# Patient Record
Sex: Female | Born: 1960 | Race: White | Hispanic: No | Marital: Married | Smoking: Never smoker
Health system: Southern US, Community
[De-identification: ages and names within clinical notes are randomized; demographics above are authoritative.]

## PROBLEM LIST (undated history)

## (undated) DIAGNOSIS — K219 Gastro-esophageal reflux disease without esophagitis: Secondary | ICD-10-CM

## (undated) DIAGNOSIS — N979 Female infertility, unspecified: Secondary | ICD-10-CM

## (undated) DIAGNOSIS — M1990 Unspecified osteoarthritis, unspecified site: Secondary | ICD-10-CM

## (undated) DIAGNOSIS — T7840XA Allergy, unspecified, initial encounter: Secondary | ICD-10-CM

## (undated) DIAGNOSIS — J309 Allergic rhinitis, unspecified: Secondary | ICD-10-CM

## (undated) DIAGNOSIS — F329 Major depressive disorder, single episode, unspecified: Secondary | ICD-10-CM

## (undated) DIAGNOSIS — K589 Irritable bowel syndrome without diarrhea: Secondary | ICD-10-CM

## (undated) DIAGNOSIS — I341 Nonrheumatic mitral (valve) prolapse: Secondary | ICD-10-CM

## (undated) DIAGNOSIS — C801 Malignant (primary) neoplasm, unspecified: Secondary | ICD-10-CM

## (undated) DIAGNOSIS — R9389 Abnormal findings on diagnostic imaging of other specified body structures: Secondary | ICD-10-CM

## (undated) DIAGNOSIS — F419 Anxiety disorder, unspecified: Secondary | ICD-10-CM

## (undated) DIAGNOSIS — G4730 Sleep apnea, unspecified: Secondary | ICD-10-CM

## (undated) DIAGNOSIS — F32A Depression, unspecified: Secondary | ICD-10-CM

## (undated) DIAGNOSIS — J45909 Unspecified asthma, uncomplicated: Secondary | ICD-10-CM

## (undated) DIAGNOSIS — Z86010 Personal history of colonic polyps: Secondary | ICD-10-CM

## (undated) DIAGNOSIS — U071 COVID-19: Secondary | ICD-10-CM

## (undated) DIAGNOSIS — E785 Hyperlipidemia, unspecified: Secondary | ICD-10-CM

## (undated) DIAGNOSIS — G709 Myoneural disorder, unspecified: Secondary | ICD-10-CM

## (undated) DIAGNOSIS — G43909 Migraine, unspecified, not intractable, without status migrainosus: Secondary | ICD-10-CM

## (undated) DIAGNOSIS — M8580 Other specified disorders of bone density and structure, unspecified site: Secondary | ICD-10-CM

## (undated) DIAGNOSIS — H269 Unspecified cataract: Secondary | ICD-10-CM

## (undated) HISTORY — DX: Major depressive disorder, single episode, unspecified: F32.9

## (undated) HISTORY — DX: Hyperlipidemia, unspecified: E78.5

## (undated) HISTORY — DX: Personal history of colonic polyps: Z86.010

## (undated) HISTORY — DX: Abnormal findings on diagnostic imaging of other specified body structures: R93.89

## (undated) HISTORY — DX: Unspecified cataract: H26.9

## (undated) HISTORY — DX: COVID-19: U07.1

## (undated) HISTORY — PX: EYE SURGERY: SHX253

## (undated) HISTORY — PX: HYSTEROPLASTY REPAIR OF UTERINE ANOMALY: SUR663

## (undated) HISTORY — PX: APPENDECTOMY: SHX54

## (undated) HISTORY — DX: Depression, unspecified: F32.A

## (undated) HISTORY — DX: Migraine, unspecified, not intractable, without status migrainosus: G43.909

## (undated) HISTORY — DX: Other specified disorders of bone density and structure, unspecified site: M85.80

## (undated) HISTORY — DX: Unspecified osteoarthritis, unspecified site: M19.90

## (undated) HISTORY — DX: Myoneural disorder, unspecified: G70.9

## (undated) HISTORY — DX: Allergy, unspecified, initial encounter: T78.40XA

## (undated) HISTORY — DX: Sleep apnea, unspecified: G47.30

## (undated) HISTORY — DX: Allergic rhinitis, unspecified: J30.9

## (undated) HISTORY — PX: LAPAROSCOPIC ENDOMETRIOSIS FULGURATION: SUR769

## (undated) HISTORY — DX: Malignant (primary) neoplasm, unspecified: C80.1

## (undated) HISTORY — DX: Nonrheumatic mitral (valve) prolapse: I34.1

## (undated) HISTORY — DX: Female infertility, unspecified: N97.9

## (undated) HISTORY — DX: Unspecified asthma, uncomplicated: J45.909

## (undated) HISTORY — DX: Anxiety disorder, unspecified: F41.9

## (undated) HISTORY — DX: Gastro-esophageal reflux disease without esophagitis: K21.9

## (undated) HISTORY — DX: Irritable bowel syndrome, unspecified: K58.9

## (undated) HISTORY — PX: MANDIBLE RECONSTRUCTION: SHX431

---

## 2004-07-01 HISTORY — PX: CARDIAC CATHETERIZATION: SHX172

## 2006-07-01 DIAGNOSIS — Z860100 Personal history of colon polyps, unspecified: Secondary | ICD-10-CM

## 2006-07-01 HISTORY — DX: Personal history of colonic polyps: Z86.010

## 2006-07-01 HISTORY — DX: Personal history of colon polyps, unspecified: Z86.0100

## 2006-08-20 LAB — HM COLONOSCOPY

## 2009-07-01 HISTORY — PX: TRIGGER FINGER RELEASE: SHX641

## 2009-07-01 HISTORY — PX: CARPAL TUNNEL RELEASE: SHX101

## 2010-09-19 ENCOUNTER — Other Ambulatory Visit (INDEPENDENT_AMBULATORY_CARE_PROVIDER_SITE_OTHER): Payer: No Typology Code available for payment source

## 2010-09-19 ENCOUNTER — Other Ambulatory Visit: Payer: Self-pay | Admitting: Internal Medicine

## 2010-09-19 ENCOUNTER — Ambulatory Visit (INDEPENDENT_AMBULATORY_CARE_PROVIDER_SITE_OTHER): Payer: No Typology Code available for payment source | Admitting: Internal Medicine

## 2010-09-19 ENCOUNTER — Encounter: Payer: Self-pay | Admitting: Internal Medicine

## 2010-09-19 VITALS — BP 110/74 | HR 69 | Temp 97.9°F | Ht 68.0 in | Wt 141.0 lb

## 2010-09-19 DIAGNOSIS — K589 Irritable bowel syndrome without diarrhea: Secondary | ICD-10-CM

## 2010-09-19 DIAGNOSIS — Z0000 Encounter for general adult medical examination without abnormal findings: Secondary | ICD-10-CM

## 2010-09-19 DIAGNOSIS — D126 Benign neoplasm of colon, unspecified: Secondary | ICD-10-CM

## 2010-09-19 DIAGNOSIS — F32A Depression, unspecified: Secondary | ICD-10-CM

## 2010-09-19 DIAGNOSIS — E785 Hyperlipidemia, unspecified: Secondary | ICD-10-CM

## 2010-09-19 DIAGNOSIS — R41840 Attention and concentration deficit: Secondary | ICD-10-CM

## 2010-09-19 DIAGNOSIS — B351 Tinea unguium: Secondary | ICD-10-CM

## 2010-09-19 DIAGNOSIS — F329 Major depressive disorder, single episode, unspecified: Secondary | ICD-10-CM

## 2010-09-19 DIAGNOSIS — K635 Polyp of colon: Secondary | ICD-10-CM | POA: Insufficient documentation

## 2010-09-19 DIAGNOSIS — F3289 Other specified depressive episodes: Secondary | ICD-10-CM

## 2010-09-19 DIAGNOSIS — R9389 Abnormal findings on diagnostic imaging of other specified body structures: Secondary | ICD-10-CM | POA: Insufficient documentation

## 2010-09-19 DIAGNOSIS — G43909 Migraine, unspecified, not intractable, without status migrainosus: Secondary | ICD-10-CM | POA: Insufficient documentation

## 2010-09-19 LAB — COMPREHENSIVE METABOLIC PANEL
Albumin: 4.5 g/dL (ref 3.5–5.2)
CO2: 31 mEq/L (ref 19–32)
Calcium: 9.6 mg/dL (ref 8.4–10.5)
Chloride: 100 mEq/L (ref 96–112)
GFR: 73.47 mL/min (ref >60.00)
Glucose, Bld: 90 mg/dL (ref 70–99)
Potassium: 4.1 mEq/L (ref 3.5–5.1)
Sodium: 138 mEq/L (ref 135–145)
Total Protein: 7.5 g/dL (ref 6.0–8.3)

## 2010-09-19 LAB — CBC WITH DIFFERENTIAL/PLATELET
Basophils Absolute: 0 10*3/uL (ref 0.0–0.1)
Basophils Relative: 0.3 % (ref 0.0–3.0)
Hemoglobin: 13.4 g/dL (ref 12.0–15.0)
Lymphocytes Relative: 22 % (ref 12.0–46.0)
Monocytes Relative: 7.4 % (ref 3.0–12.0)
Neutro Abs: 3.2 10*3/uL (ref 1.4–7.7)
RBC: 4.32 Mil/uL (ref 3.87–5.11)
RDW: 13.1 % (ref 11.5–14.6)

## 2010-09-19 MED ORDER — SERTRALINE HCL 100 MG PO TABS: 100.0000 mg | ORAL_TABLET | Freq: Every day | ORAL | Status: DC

## 2010-09-19 NOTE — Progress Notes (Signed)
Subjective:    Patient ID: Renee [name], female    DOB: April 22, 1961, 50 y.o.   MRN: [id number]  HPI Renee [name] presents to establish for on-going continuity care having recently moved to [place] from [place].  She reports a 2 month history of left neck pain with no loss of range of motion, no radicular symptoms. She reports that massage does help.  She reports that several toe-nails have become thickend but not to the point of pain or discomfort.  For the last two years she has been taking zoloft for depression with anxiety. For many years she has had a problem with concentration and distraction. This was discussed with her psychiatrist at the time she started zoloft and the psychiatrist was hopeful that the zoloft would reduce the distractability but it hasn't. At this point she is interested in additional treatment  For several months she has had increasing hot flashes. Her last menstrual period was December '10.  Past Medical History  Diagnosis Date  . Allergy   . Migraines   . Hx of colonic polyps   . MVP (mitral valve prolapse)   . IBS (irritable bowel syndrome)   . Arthritis     both knees, post traumatic  . Depression     with anxiety. Medically treated for last 2 years  . Migraine     resolved after teenage years  . Colon polyps   . IBS (irritable bowel syndrome)   . Infertility, female     failed 3 attempts at in vitro fertilization  . MVP (mitral valve prolapse)     last 2D echo approx '10  . Abnormal chest x-ray     CXR c/w COPD. Full PFTs '11 - normal   Past Surgical History  Procedure Date  . Appendectomy   . Mandible reconstruction   . Laparoscopic endometriosis fulguration     1996 and 2002  . Hysteroplasty repair of uterine anomaly     2003 - laproscopically; 2006 - laparotomy  . Cardiac catheterization     2006 - normal study  . Carpal tunnel release     2011 right hand   Family History  Problem Relation Age of Onset  . Arthritis Mother    DOB 70  . Cancer Mother     Thyroid cancer  . Breast cancer Mother   . Hypertension Mother   . Atrial fibrillation Mother   . Melanoma Mother   . Arthritis Father     DOB 1929  . Melanoma Father   Negative for colon cancer, diabetes, Coronary artery disease/MI  History   Social History  . Marital Status: Married    Spouse Name: N/A    Number of Children: 0  . Years of Education: 16   Occupational History  . 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 Smoking status: Never Smoker   . Smokeless tobacco: Never Used  . Alcohol Use: No  . Drug Use: No  . Sexually Active: Yes   Other Topics Concern  . Not on file   Social History Narrative   Graduated from [employer] with degree in Food science/nutrition.Married '94Denies any physical or sexual abuseFamily in [place]: parents and sisterSO EtOH problems - in recovery 6 yearsHas smoke alarms, wears seatbelt at all times, uses [place] when appropriate, firearms present in the home, uses herbal remedies, caffeine- 2-3 per day.No regular exercise       Review of Systems  Constitutional:  Negative.        [Night sweats HENT: Positive for neck pain and neck stiffness. Negative for hearing loss, congestion, facial swelling and tinnitus.   Eyes: Negative.   Respiratory: Positive for shortness of breath.        [Occasional air hunger - "sigh of anxiety" Cardiovascular: Positive for palpitations. Negative for chest pain and leg swelling.  Gastrointestinal: Positive for abdominal distention.       [Bloating, heartburn Genitourinary: Negative.  Negative for dyspareunia.  Musculoskeletal:       [Right hand stiffness, mostly in the AM Neurological: Negative.   Psychiatric/Behavioral: Positive for decreased concentration.       [Anxiety persists      Objective:   Physical Exam  [nursing notereviewed. Constitutional: She is oriented to person, place, and time. She appears  well-developed and well-nourished.  HENT:  Head: Normocephalic and atraumatic.  Right Ear: External ear normal.  Left Ear: External ear normal.  Mouth/Throat: Oropharynx is clear and moist.  Eyes: Conjunctivae and EOM are normal. Pupils are equal, round, and reactive to light.  Neck: Normal range of motion. Neck supple. No thyromegaly present.  Cardiovascular: Normal rate and regular rhythm.  Exam reveals no friction rub.   Murmur heard.      Mitral valve click/murmur at apex  Pulmonary/Chest: Effort normal and breath sounds normal. She has no rales.  Abdominal: Soft. Bowel sounds are normal. She exhibits no distension. There is no tenderness.  Musculoskeletal: Normal range of motion.  Neurological: She is alert and oriented to person, place, and time. She has normal reflexes.  Skin: Skin is warm and dry.       Very mild nail thickening 2nd, 3rd toes right foot.  Psychiatric: She has a normal mood and affect. Her behavior is normal. Thought content normal.          Assessment & Plan:  1. Distraction and lack of concentration which may be ADD  Plan - refer to [facility] for ADD assessment  2. Left Neck pain - limited exam is normal. No restriction in activity  Plan - orthopedic neck pillow (memory foam)            Daily range of motion - i.e. Neck rolls, shoulder shrugs. May go to YouTube - search neck pain and exercise - for video demonstrations  3. Paronychia - mild.   Plan - no medical treatment indicated. Pt referred to [email or web address]  4. Symptoms of climacteric - not too severe. Discussed hormone replacement and risks, treatment being a question of life-style issues.  Plan - referred to [email or web address] for more information.  5. Mitral Valve Prolapse - she has had 2 D echo in the last 2 years and being asymptomatic would need follow-up 2 D echo at 3-5 years.   6. Health Maintenance - patient is current with gyn and will be due for Pelvic/PAP in May. She  will decide between receiving care here or with gyn. She will be establishing with Dr. Nicholas Lose for dermatology. She is due for colonoscopy and will be referred to GI for the same. She is a candidate for tetnus booster. She has had pulmonary function testing and does not need any furhter evaluation. She is for routine labs today. She will return in 4-6 weeks or in May.  Addendum - all labs are normal with LDL less than 130. Lab Results  Component Value Date   WBC 4.8 09/19/2010   HGB 13.4 09/19/2010   HCT 38.9 09/19/2010  PLT 188.0 09/19/2010   CHOL 208* 09/19/2010   TRIG 42.0 09/19/2010   HDL 82.80 09/19/2010   LDLDIRECT 105.3 09/19/2010   ALT 17 09/19/2010   AST 23 09/19/2010   NA 138 09/19/2010   K 4.1 09/19/2010   CL 100 09/19/2010   CREATININE 0.9 09/19/2010   BUN 19 09/19/2010   CO2 31 09/19/2010   TSH 1.76 09/19/2010

## 2010-09-20 ENCOUNTER — Encounter (INDEPENDENT_AMBULATORY_CARE_PROVIDER_SITE_OTHER): Payer: Self-pay | Admitting: *Deleted

## 2010-09-20 LAB — GLIA (IGA/G) + TTG IGA: Tissue Transglutaminase Ab, IgA: 3.2 U/mL (ref <20)

## 2010-09-23 ENCOUNTER — Encounter: Payer: Self-pay | Admitting: Internal Medicine

## 2010-09-25 ENCOUNTER — Encounter: Payer: Self-pay | Admitting: Internal Medicine

## 2010-09-27 NOTE — Letter (Signed)
Summary: Pre Visit Letter Revised  [facility]  [address] [place], [place] [address]   Phone: [phone]  Fax: [phone]        09/20/2010 MRN: [id number] Renee [name] [address] [place], [place]  [address]  [place]             Procedure Date:  11-12-10           Direct Colon---Dr. Leone Payor   Welcome to the Gastroenterology Division at [facility].    You are scheduled to see a nurse for your pre-procedure visit on 10-29-10 at 2:30p.m. on the 3rd floor at [facility], [address]  We ask that you try to arrive at our office 15 minutes prior to your appointment time to allow for check-in.  Please take a minute to review the attached form.  If you answer "Yes" to one or more of the questions on the first page, we ask that you call the person listed at your earliest opportunity.  If you answer "No" to all of the questions, please complete the rest of the form and bring it to your appointment.    Your nurse visit will consist of discussing your medical and surgical history, your immediate family medical history, and your medications.   If you are unable to list all of your medications on the form, please bring the medication bottles to your appointment and we will list them.  We will need to be aware of both prescribed and over the counter drugs.  We will need to know exact dosage information as well.    Please be prepared to read and sign documents such as consent forms, a financial agreement, and acknowledgement forms.  If necessary, and with your consent, a friend or relative is welcome to sit-in on the nurse visit with you.  Please bring your insurance card so that we may make a copy of it.  If your insurance requires a referral to see a specialist, please bring your referral form from your primary care physician.  No co-pay is required for this nurse visit.     If you cannot keep your appointment, please call [phone] to cancel or reschedule  prior to your appointment date.  This allows [place] the opportunity to schedule an appointment for another patient in need of care.    Thank you for choosing [facility] for your medical needs.  We appreciate the opportunity to care for you.  Please visit [place] at our website  to learn more about our practice.  Sincerely, The Gastroenterology Division

## 2010-10-01 ENCOUNTER — Ambulatory Visit (INDEPENDENT_AMBULATORY_CARE_PROVIDER_SITE_OTHER): Payer: 59 | Admitting: Licensed Clinical Social Worker

## 2010-10-01 DIAGNOSIS — F4322 Adjustment disorder with anxiety: Secondary | ICD-10-CM

## 2010-10-02 ENCOUNTER — Other Ambulatory Visit (INDEPENDENT_AMBULATORY_CARE_PROVIDER_SITE_OTHER): Payer: 59

## 2010-10-02 DIAGNOSIS — F4322 Adjustment disorder with anxiety: Secondary | ICD-10-CM

## 2010-10-04 ENCOUNTER — Ambulatory Visit (INDEPENDENT_AMBULATORY_CARE_PROVIDER_SITE_OTHER): Payer: 59 | Admitting: Licensed Clinical Social Worker

## 2010-10-04 DIAGNOSIS — F4323 Adjustment disorder with mixed anxiety and depressed mood: Secondary | ICD-10-CM

## 2010-11-12 ENCOUNTER — Other Ambulatory Visit: Payer: No Typology Code available for payment source | Admitting: Internal Medicine

## 2011-08-08 ENCOUNTER — Telehealth: Payer: Self-pay | Admitting: Internal Medicine

## 2011-08-08 NOTE — Telephone Encounter (Signed)
Ok for refill on zoloft x 5

## 2011-08-08 NOTE — Telephone Encounter (Signed)
The pt called and stated she needed a refill of Zoloft 100mg .  Thanks!

## 2011-08-09 MED ORDER — SERTRALINE HCL 100 MG PO TABS: 100.0000 mg | ORAL_TABLET | Freq: Every day | ORAL | Status: DC

## 2011-08-09 NOTE — Telephone Encounter (Signed)
Done

## 2011-09-18 ENCOUNTER — Other Ambulatory Visit: Payer: Self-pay | Admitting: Internal Medicine

## 2011-11-11 ENCOUNTER — Ambulatory Visit (INDEPENDENT_AMBULATORY_CARE_PROVIDER_SITE_OTHER): Payer: 59 | Admitting: Internal Medicine

## 2011-11-11 ENCOUNTER — Encounter: Payer: Self-pay | Admitting: Internal Medicine

## 2011-11-11 ENCOUNTER — Ambulatory Visit (INDEPENDENT_AMBULATORY_CARE_PROVIDER_SITE_OTHER)
Admission: RE | Admit: 2011-11-11 | Discharge: 2011-11-11 | Disposition: A | Discharge status: Home / Self Care | Payer: 59 | Source: Ambulatory Visit | Attending: Internal Medicine | Admitting: Internal Medicine

## 2011-11-11 VITALS — BP 120/72 | HR 69 | Temp 98.4°F | Ht 68.0 in | Wt 147.1 lb

## 2011-11-11 DIAGNOSIS — N951 Menopausal and female climacteric states: Secondary | ICD-10-CM

## 2011-11-11 DIAGNOSIS — M542 Cervicalgia: Secondary | ICD-10-CM

## 2011-11-11 DIAGNOSIS — F329 Major depressive disorder, single episode, unspecified: Secondary | ICD-10-CM

## 2011-11-11 DIAGNOSIS — F3289 Other specified depressive episodes: Secondary | ICD-10-CM

## 2011-11-11 DIAGNOSIS — F32A Depression, unspecified: Secondary | ICD-10-CM

## 2011-11-11 DIAGNOSIS — R232 Flushing: Secondary | ICD-10-CM

## 2011-11-11 DIAGNOSIS — D126 Benign neoplasm of colon, unspecified: Secondary | ICD-10-CM

## 2011-11-11 DIAGNOSIS — J329 Chronic sinusitis, unspecified: Secondary | ICD-10-CM

## 2011-11-11 DIAGNOSIS — IMO0002 Reserved for concepts with insufficient information to code with codable children: Secondary | ICD-10-CM

## 2011-11-11 DIAGNOSIS — K635 Polyp of colon: Secondary | ICD-10-CM

## 2011-11-11 DIAGNOSIS — M6530 Trigger finger, unspecified finger: Secondary | ICD-10-CM

## 2011-11-11 MED ORDER — NAPROXEN SODIUM 220 MG PO TABS: 220.0000 mg | ORAL_TABLET | Freq: Two times a day (BID) | ORAL | Status: DC

## 2011-11-11 MED ORDER — CYCLOBENZAPRINE HCL 5 MG PO TABS: 5.0000 mg | ORAL_TABLET | Freq: Three times a day (TID) | ORAL | Status: DC | PRN

## 2011-11-11 MED ORDER — SERTRALINE HCL 25 MG PO TABS: 25.0000 mg | ORAL_TABLET | Freq: Every day | ORAL | Status: DC

## 2011-11-11 MED ORDER — FLUTICASONE PROPIONATE 50 MCG/ACT NA SUSP: 2.0000 | Freq: Every day | NASAL | Status: DC

## 2011-11-11 NOTE — Assessment & Plan Note (Signed)
Hx same - last colo 2008 - pt will fax copy of report (last done in [place]) Refer to GI now for follow up

## 2011-11-11 NOTE — Assessment & Plan Note (Signed)
Nocturnal congestion when supine Chronic Afrin use Continue Claritin, start flonase and stop Afrin - Refer to ENT at pt request to check for septal issues or other sinus problems

## 2011-11-11 NOTE — Assessment & Plan Note (Signed)
Hx surgical release on R hand - thumb and ring finger 2011 Failed conservative tx with steroid injections prior to surgery Now increasing symptoms in L thumb and recurrent in R ring finger Refer to hand for eval and tx of same

## 2011-11-11 NOTE — Assessment & Plan Note (Signed)
On SSRI since 2010 - started for irritability with perimenopausal symptoms (hot flashes) Would like to slowly wean off same -  Drop from sertraline 150>125  Qd for 4 weeks, then 100mg  qd x 4-6 wks;, goal 75mg  qd Works with [employer] health [employer]) prn

## 2011-11-11 NOTE — Progress Notes (Signed)
Subjective:    Patient ID: Renee [name], female    DOB: [birth year], 51 y.o.   MRN: [id number]  HPI New to me, known to my practice (transfer from [facility]) - here to est care with female provider Multiple concerns  Depression - on SSRI since 2010 with good symptoms relief - has seen psyc intermittently during the past years, no regular scheduled appt at this time. ?ok to wean off sertraline  Trigger fingers - R hand requiring surgical release in 2011 after failure to respond to steroids - now recurrent symptoms R 4th finger and new in L thumb - requests hand eval  Also complains of sinusitis - chronic allergies, but nasal congestion worse at night Using Afrin chronically - ?ENT eval  Also R neck tightness - ?neck arthritis - no injury - no numbness or radiation into RUE - not using any OTC meds or PT - no weakness, no headache   Past Medical History  Diagnosis Date  . Allergic rhinitis, cause unspecified   . Migraines     during teenage years  . Hx of colonic polyps 2008  . IBS (irritable bowel syndrome)   . Arthritis     both knees, post traumatic - neck  . Depression     with anxiety.  . Infertility, female     failed 3 attempts at in vitro fertilization  . MVP (mitral valve prolapse)     last 2D echo approx '10  . Abnormal chest x-ray     CXR c/w COPD. Full PFTs '11 - normal   Family History  Problem Relation Age of Onset  . Arthritis Mother     DOB 71  . Cancer Mother     Thyroid cancer  . Breast cancer Mother   . Hypertension Mother   . Atrial fibrillation Mother   . Melanoma Mother   . Arthritis Father     DOB 1929  . Melanoma Father    History  Substance Use Topics  . Smoking status: Never Smoker   . Smokeless tobacco: Never Used  . Alcohol Use: No    Review of Systems Constitutional: Negative for fever or weight change.  Respiratory: Negative for cough and shortness of breath.   Cardiovascular: Negative for chest pain or palpitations.    Gastrointestinal: Negative for abdominal pain, no bowel changes.  Musculoskeletal: Negative for gait problem or joint swelling.  Skin: Negative for rash.  Neurological: Negative for dizziness or headache.  No other specific complaints in a complete review of systems (except as listed in HPI above).     Objective:   Physical Exam BP 120/72  Pulse 69  Temp(Src) 98.4 F (36.9 C) (Oral)  Ht 5\' 8"  (1.727 m)  Wt 147 lb 1.9 oz (66.733 kg)  BMI 22.37 kg/m2  SpO2 98% Wt Readings from Last 3 Encounters:  11/11/11 147 lb 1.9 oz (66.733 kg)  09/19/10 141 lb (63.957 kg)   Constitutional: She appears well-developed and well-nourished. No distress.  HENT: Head: Normocephalic and atraumatic. Ears: B TMs ok, no erythema or effusion; Nose: Nose normal. No septal deviation or evidence for polyps. Mouth/Throat: Oropharynx is clear and moist. No oropharyngeal exudate.  Eyes: Conjunctivae and EOM are normal. Pupils are equal, round, and reactive to light. No scleral icterus.  Neck: Normal range of motion. Neck supple. No JVD present. No thyromegaly present.  Cardiovascular: Normal rate, regular rhythm and normal heart sounds.  No murmur heard. No BLE edema. Pulmonary/Chest: Effort normal and breath sounds normal. No  respiratory distress. She has no wheezes.  Abdominal: Soft. Bowel sounds are normal. She exhibits no distension. There is no tenderness. no masses Musculoskeletal: Myofacial spasm R trap/upper neck. R shoulder with normal range of motion. no joint effusions. No gross deformities. Trigger finger on L thumb and R ring finger - no locking but incomplete flexion R 4th finger with hand grip. nontender Neurological: She is alert and oriented to person, place, and time. No cranial nerve deficit. Coordination, balance and gait normal.  Skin: Skin is warm and dry. No rash noted. No erythema.  Psychiatric: She has a normal mood and affect. Her behavior is normal. Judgment and thought content normal.    Lab Results  Component Value Date   WBC 4.8 09/19/2010   HGB 13.4 09/19/2010   HCT 38.9 09/19/2010   PLT 188.0 09/19/2010   GLUCOSE 90 09/19/2010   CHOL 208* 09/19/2010   TRIG 42.0 09/19/2010   HDL 82.80 09/19/2010   LDLDIRECT 105.3 09/19/2010   ALT 17 09/19/2010   AST 23 09/19/2010   NA 138 09/19/2010   K 4.1 09/19/2010   CL 100 09/19/2010   CREATININE 0.9 09/19/2010   BUN 19 09/19/2010   CO2 31 09/19/2010   TSH 1.76 09/19/2010       Assessment & Plan:  See problem list. Medications and labs reviewed today.  cervicalgia - muscle spasm R neck intermittent for >28mo - check DG cpsine to look for DDD - on exam neurovasc intact RUE - add flexeril qhs x 1 week and recommended Aleve qd x 1 week - then both prn

## 2011-11-11 NOTE — Patient Instructions (Addendum)
It was good to see you today. We have reviewed your prior records including labs and tests today Reduce sertraline to 125 mg daily Start Aleve and flexeril at bedtime for 1 week, then use both as needed Your prescription(s) have been submitted to your pharmacy. Please take as directed and contact our office if you believe you are having problem(s) with the medication(s). Xray neck ordered today. Your results will be called to you after review (48-72hours after test completion). If any changes need to be made, you will be notified at that time. we'll make referral to gynecology, gastroenterology, hand and ENT . Our office will contact you regarding appointment(s) once made. Please schedule followup in 6-12 weeks for physical and labs, call sooner if problems.

## 2011-11-18 ENCOUNTER — Telehealth: Payer: Self-pay | Admitting: *Deleted

## 2011-11-18 DIAGNOSIS — Z0000 Encounter for general adult medical examination without abnormal findings: Secondary | ICD-10-CM

## 2011-11-18 NOTE — Telephone Encounter (Signed)
Pt coming in to have cpx need labs entered in epic,,, 11/18/11@4 :05pm/LMB

## 2012-01-28 ENCOUNTER — Encounter: Payer: Self-pay | Admitting: Gastroenterology

## 2012-02-04 HISTORY — PX: TRIGGER FINGER RELEASE: SHX641

## 2012-02-10 ENCOUNTER — Other Ambulatory Visit (INDEPENDENT_AMBULATORY_CARE_PROVIDER_SITE_OTHER): Payer: 59

## 2012-02-10 DIAGNOSIS — Z0000 Encounter for general adult medical examination without abnormal findings: Secondary | ICD-10-CM

## 2012-02-10 LAB — URINALYSIS, ROUTINE W REFLEX MICROSCOPIC
Hgb urine dipstick: NEGATIVE
Leukocytes, UA: NEGATIVE
Nitrite: NEGATIVE
Specific Gravity, Urine: 1.02 (ref 1.000–1.030)
Urine Glucose: NEGATIVE
Urobilinogen, UA: 0.2 (ref 0.0–1.0)

## 2012-02-10 LAB — LDL CHOLESTEROL, DIRECT: Direct LDL: 120.1 mg/dL

## 2012-02-10 LAB — HEPATIC FUNCTION PANEL
Bilirubin, Direct: 0.1 mg/dL (ref 0.0–0.3)
Total Bilirubin: 1 mg/dL (ref 0.3–1.2)
Total Protein: 7.7 g/dL (ref 6.0–8.3)

## 2012-02-10 LAB — BASIC METABOLIC PANEL
GFR: 72.1 mL/min (ref >60.00)
Glucose, Bld: 86 mg/dL (ref 70–99)
Potassium: 4.6 mEq/L (ref 3.5–5.1)
Sodium: 139 mEq/L (ref 135–145)

## 2012-02-10 LAB — LIPID PANEL
HDL: 76.7 mg/dL (ref >39.00)
Total CHOL/HDL Ratio: 3

## 2012-02-12 ENCOUNTER — Ambulatory Visit (INDEPENDENT_AMBULATORY_CARE_PROVIDER_SITE_OTHER): Payer: 59 | Admitting: Internal Medicine

## 2012-02-12 ENCOUNTER — Encounter: Payer: Self-pay | Admitting: Internal Medicine

## 2012-02-12 VITALS — BP 112/68 | HR 61 | Temp 97.7°F | Ht 68.0 in | Wt 145.0 lb

## 2012-02-12 DIAGNOSIS — Z0000 Encounter for general adult medical examination without abnormal findings: Secondary | ICD-10-CM

## 2012-02-12 DIAGNOSIS — F32A Depression, unspecified: Secondary | ICD-10-CM

## 2012-02-12 DIAGNOSIS — R5381 Other malaise: Secondary | ICD-10-CM

## 2012-02-12 DIAGNOSIS — F3289 Other specified depressive episodes: Secondary | ICD-10-CM

## 2012-02-12 DIAGNOSIS — R002 Palpitations: Secondary | ICD-10-CM

## 2012-02-12 DIAGNOSIS — R5383 Other fatigue: Secondary | ICD-10-CM

## 2012-02-12 DIAGNOSIS — F329 Major depressive disorder, single episode, unspecified: Secondary | ICD-10-CM

## 2012-02-12 DIAGNOSIS — N959 Unspecified menopausal and perimenopausal disorder: Secondary | ICD-10-CM | POA: Insufficient documentation

## 2012-02-12 DIAGNOSIS — Z23 Encounter for immunization: Secondary | ICD-10-CM

## 2012-02-12 MED ORDER — SERTRALINE HCL 25 MG PO TABS: 25.0000 mg | ORAL_TABLET | Freq: Every day | ORAL | Status: DC

## 2012-02-12 NOTE — Patient Instructions (Signed)
It was good to see you today. We have reviewed your prior records including labs and tests today Health Maintenance reviewed - tetanus updated today. All other recommended immunizations and age-appropriate screenings are up-to-date. we'll make referral for DEXA (bone density) . Our office will contact you regarding appointment(s) once made. Medications reviewed, no changes at this time. Work wit dr Jearld Fenton as ongoing for your sinus/allergy and throat as discussed Please schedule followup in 6 months, call sooner if problems.

## 2012-02-12 NOTE — Progress Notes (Signed)
Subjective:    Patient ID: Renee [name], female    DOB: April 25, 1961, 51 y.o.   MRN: [id number]  HPI  patient is here today for annual physical. Patient feels well and has no complaints.  Also reviewed chronic medical issues:  Depression - on SSRI since 2010 with good symptoms relief - has seen psyc intermittently during the past years, no regular scheduled appt at this time. Working to wean off sertraline  Trigger fingers - R hand requiring surgical release in 2011 after failure to respond to steroids - then recurrent symptoms R 4th finger (injection 12/2011) and L thumb (surgery 01/2012) -    Past Medical History  Diagnosis Date  . Allergic rhinitis, cause unspecified   . Migraines     during teenage years  . Hx of colonic polyps 2008  . IBS (irritable bowel syndrome)   . Arthritis     both knees, post traumatic - neck  . Depression     with anxiety.  . Infertility, female     failed 3 attempts at in vitro fertilization  . MVP (mitral valve prolapse)     last 2D echo approx '10  . Abnormal chest x-ray     CXR c/w COPD. Full PFTs '11 - normal   Family History  Problem Relation Age of Onset  . Arthritis Mother     DOB 69  . Cancer Mother     Thyroid cancer  . Breast cancer Mother   . Hypertension Mother   . Atrial fibrillation Mother   . Melanoma Mother   . Arthritis Father     DOB 1929  . Melanoma Father    History  Substance Use Topics  . Smoking status: Never Smoker   . Smokeless tobacco: Never Used  . Alcohol Use: No    Review of Systems  Constitutional: Negative for fever or weight change. positive for fatigue Respiratory: Negative for cough and shortness of breath.   Cardiovascular: Negative for chest pain or palpitations.  Gastrointestinal: Negative for abdominal pain, no bowel changes.  Musculoskeletal: Negative for gait problem or joint swelling.  Skin: Negative for rash.  Neurological: Negative for dizziness or headache.  No other specific  complaints in a complete review of systems (except as listed in HPI above).     Objective:   Physical Exam  BP 112/68  Pulse 61  Temp 97.7 F (36.5 C) (Oral)  Ht 5\' 8"  (1.727 m)  Wt 145 lb (65.772 kg)  BMI 22.05 kg/m2  SpO2 98% Wt Readings from Last 3 Encounters:  02/12/12 145 lb (65.772 kg)  11/11/11 147 lb 1.9 oz (66.733 kg)  09/19/10 141 lb (63.957 kg)   Constitutional: She appears well-developed and well-nourished. No distress.  HENT: Head: Normocephalic and atraumatic. Ears: B TMs ok, no erythema or effusion; Nose: Nose normal. No septal deviation or evidence for polyps. Mouth/Throat: Oropharynx is clear and moist. No oropharyngeal exudate. tiny flesh colored polyp L OP Eyes: Conjunctivae and EOM are normal. Pupils are equal, round, and reactive to light. No scleral icterus.  Neck: Normal range of motion. Neck supple. No JVD present. No thyromegaly or goiter present.  Cardiovascular: Normal rate, regular rhythm and normal heart sounds.  No murmur heard. No BLE edema. Pulmonary/Chest: Effort normal and breath sounds normal. No respiratory distress. She has no wheezes.  Abdominal: Soft. Bowel sounds are normal. She exhibits no distension. There is no tenderness. no masses Musculoskeletal: no gross deformity - s/p L thumb trigger surgery  Neurological: She  is alert and oriented to person, place, and time. No cranial nerve deficit. Coordination, balance and gait normal.  Skin: Skin is warm and dry. No rash noted. No erythema.  Psychiatric: She has a normal mood and affect. Her behavior is normal. Judgment and thought content normal.   Lab Results  Component Value Date   WBC 4.8 09/19/2010   HGB 13.4 09/19/2010   HCT 38.9 09/19/2010   PLT 188.0 09/19/2010   GLUCOSE 86 02/10/2012   CHOL 214* 02/10/2012   TRIG 104.0 02/10/2012   HDL 76.70 02/10/2012   LDLDIRECT 120.1 02/10/2012   ALT 15 02/10/2012   AST 19 02/10/2012   NA 139 02/10/2012   K 4.6 02/10/2012   CL 101 02/10/2012    CREATININE 0.9 02/10/2012   BUN 17 02/10/2012   CO2 31 02/10/2012   TSH 2.82 02/10/2012   ECG: sinus @ 61 bpm - RBBB and R axis    Assessment & Plan:  CPX/v70.0 - Patient has been counseled on age-appropriate routine health concerns for screening and prevention. These are reviewed and up-to-date. Immunizations are up-to-date or declined. Labs and ECG reviewed.  Fatigue - nonspecific hx/exam - FH of thyroid issues reviewed but normal TSH and exam - ?depression - see next - will monitor  Also see problem list. Medications and labs reviewed today.

## 2012-02-12 NOTE — Assessment & Plan Note (Signed)
On SSRI since 2010 - started for irritability with perimenopausal symptoms (hot flashes) Would like to slowly wean off same -  Dropped from sertraline 150>125 in 10/2011 - stable at this time Also works with [employer] health [employer]) prn

## 2012-02-18 ENCOUNTER — Ambulatory Visit (INDEPENDENT_AMBULATORY_CARE_PROVIDER_SITE_OTHER)
Admission: RE | Admit: 2012-02-18 | Discharge: 2012-02-18 | Disposition: A | Discharge status: Home / Self Care | Payer: No Typology Code available for payment source | Source: Ambulatory Visit

## 2012-02-18 DIAGNOSIS — N959 Unspecified menopausal and perimenopausal disorder: Secondary | ICD-10-CM

## 2012-02-28 ENCOUNTER — Encounter: Payer: Self-pay | Admitting: Gastroenterology

## 2012-02-28 ENCOUNTER — Ambulatory Visit (INDEPENDENT_AMBULATORY_CARE_PROVIDER_SITE_OTHER): Payer: No Typology Code available for payment source | Admitting: Gastroenterology

## 2012-02-28 VITALS — BP 118/64 | HR 74 | Ht 68.0 in | Wt 143.0 lb

## 2012-02-28 DIAGNOSIS — R195 Other fecal abnormalities: Secondary | ICD-10-CM

## 2012-02-28 DIAGNOSIS — R198 Other specified symptoms and signs involving the digestive system and abdomen: Secondary | ICD-10-CM

## 2012-02-28 DIAGNOSIS — R194 Change in bowel habit: Secondary | ICD-10-CM

## 2012-02-28 MED ORDER — METHYLCELLULOSE (LAXATIVE) PO POWD: 1.0000 | Freq: Every day | ORAL | Status: DC

## 2012-02-28 MED ORDER — ALIGN PO CAPS: 1.0000 | ORAL_CAPSULE | Freq: Every day | ORAL | Status: DC

## 2012-02-28 MED ORDER — PEG-KCL-NACL-NASULF-NA ASC-C 100 G PO SOLR: 1.0000 | Freq: Once | ORAL | Status: DC

## 2012-02-28 NOTE — Progress Notes (Signed)
HPI: This is a    very pleasant 51 year old woman whom I am meeting for the first time today.  Sent home with fobt cards by Dr. Vincente Poli , at least one was positive.   For 20 , issues with her bowels, told she had IBS.  Mainly constipated in the past.  In past couple weeks intermittent diarrhea.  Normally she will go 1-2 times a day, now 2-3 times a day and can be pretty loose.   She had trigger finger surgery 3 weeks ago, was on abx afterwards for a few days (5 day course) .  No narcotic pains meds for that.  No real pains in abd.  She has had colonoscopies in past, first at age 21 in [place].  Was having bloating, led to colonoscopy found two polyps that were removed.  Review of systems: Pertinent positive and negative review of systems were noted in the above HPI section. Complete review of systems was performed and was otherwise normal.    Past Medical History  Diagnosis Date  . Allergic rhinitis, cause unspecified   . Migraines     during teenage years  . Hx of colonic polyps 2008  . IBS (irritable bowel syndrome)   . Arthritis     both knees, post traumatic - neck  . Depression     with anxiety.  . Infertility, female     failed 3 attempts at in vitro fertilization  . MVP (mitral valve prolapse)     last 2D echo approx '10  . Abnormal chest x-ray     CXR c/w COPD. Full PFTs '11 - normal  . Depression     Past Surgical History  Procedure Date  . Appendectomy   . Mandible reconstruction   . Laparoscopic endometriosis fulguration     1996 and 2002  . Hysteroplasty repair of uterine anomaly     2003 - laproscopically; 2006 - laparotomy  . Cardiac catheterization 2006    normal study  . Carpal tunnel release 2011    right hand  . Trigger finger release 2011    R thimb and ring finger  . Trigger finger release 02/04/2012    Gramig, in office    Current Outpatient Prescriptions  Medication Sig Dispense Refill  . cyclobenzaprine (FLEXERIL) 5 MG tablet Take 1 tablet (5  mg total) by mouth every 8 (eight) hours as needed for muscle spasms.  30 tablet  1  . DIGESTIVE ENZYMES PO Take by mouth. Takes but not on a regular basis      . fluticasone (FLONASE) 50 MCG/ACT nasal spray Place 2 sprays into the nose daily.  16 g  6  . loratadine (CLARITIN) 10 MG tablet Take 10 mg by mouth daily.      . naproxen sodium (ALEVE) 220 MG tablet Take 1 tablet (220 mg total) by mouth 2 (two) times daily with a meal.      . Probiotic Product (PROBIOTIC PO) Take by mouth. Takes but not on a regular basis      . sertraline (ZOLOFT) 100 MG tablet Take 1 tablet (100 mg total) by mouth daily. With 25mg  tab daily  135 tablet  3  . sertraline (ZOLOFT) 25 MG tablet Take 1 tablet (25 mg total) by mouth daily. Take with 100 mg daily  90 tablet  3    Allergies as of 02/28/2012 - Review Complete 02/28/2012  Allergen Reaction Noted  . Codeine  09/19/2010  . Pollen extract  11/11/2011  Family History  Problem Relation Age of Onset  . Arthritis Mother     DOB 35  . Cancer Mother     Thyroid cancer  . Breast cancer Mother   . Hypertension Mother   . Atrial fibrillation Mother   . Melanoma Mother   . Arthritis Father     DOB 1929  . Melanoma Father   . Breast cancer Mother   . Colon cancer      Maternal Great Uncle     History   Social History  . Marital Status: Married    Spouse Name: N/A    Number of Children: 0  . Years of Education: 16   Occupational History  . 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 Smoking status: Never Smoker   . Smokeless tobacco: Never Used  . Alcohol Use: No  . Drug Use: No  . Sexually Active: Yes   Other Topics Concern  . 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       Physical Exam: BP 118/64  Pulse 74  Ht 5\' 8"  (1.727 m)  Wt 143 lb (64.864 kg)  BMI 21.74 kg/m2 Constitutional: generally well-appearing Psychiatric: alert and oriented x3 Eyes: extraocular movements intact Mouth: oral pharynx moist, no lesions Neck: supple no lymphadenopathy Cardiovascular: heart regular rate and rhythm Lungs: clear to auscultation bilaterally Abdomen: soft, nontender, nondistended, no obvious ascites, no peritoneal signs, normal bowel sounds Extremities: no lower extremity edema bilaterally Skin: no lesions on visible extremities    Assessment and plan: 51 y.o. female with  Hemoccult-positive stool, personal history of polyps (unclear pathology), recent change in bowel habits  First I suspect her recent change in her bowel habits is likely related to the antibiotic which was started for her trigger finger surgery 3 weeks ago. I suggested fiber supplements and probiotics to try to help get her bowels back to more normal. She does have Hemoccult-positive stool and her last colonoscopy was about 5 years ago, 2008. We will get records from her previous colonoscopy examinations however I think with her change in bowels, her likely personal history of precancerous polyps, her Hemoccult-positive stool she needs a repeat colonoscopy around now and we will set that up for her.

## 2012-02-28 NOTE — Patient Instructions (Addendum)
We have sent the following medications to your pharmacy for you to pick up at your convenience: Moviprep You have been scheduled for a colonoscopy Please follow written instructions given to you at your visit today.  Please pick up your prep kit at the pharmacy within the next 1-3 days. If you use inhalers (even only as needed), please bring them with you on the day of your procedure.     We will get records from Dr. Gerda Diss in [place] (colonoscopy reports and any associated pathology reports). You will be set up for a colonoscopy for hemocult + stool, recent change in bowels, personal history of polyps. Please start taking citrucel (orange flavored) powder fiber supplement.  This may cause some bloating at first but that usually goes away. Begin with a small spoonful and work your way up to a large, heaping spoonful daily over a week. Try probiotics for  few weeks.

## 2012-03-11 ENCOUNTER — Ambulatory Visit (AMBULATORY_SURGERY_CENTER): Payer: No Typology Code available for payment source | Admitting: Gastroenterology

## 2012-03-11 ENCOUNTER — Encounter: Payer: Self-pay | Admitting: Gastroenterology

## 2012-03-11 VITALS — BP 122/61 | HR 74 | Temp 96.5°F | Resp 24 | Ht 68.0 in | Wt 143.0 lb

## 2012-03-11 DIAGNOSIS — R198 Other specified symptoms and signs involving the digestive system and abdomen: Secondary | ICD-10-CM

## 2012-03-11 DIAGNOSIS — R195 Other fecal abnormalities: Secondary | ICD-10-CM

## 2012-03-11 MED ORDER — SODIUM CHLORIDE 0.9 % IV SOLN: 500.0000 mL | INTRAVENOUS | Status: DC

## 2012-03-11 NOTE — Op Note (Signed)
[facility] [address] [place], [address]   COLONOSCOPY PROCEDURE REPORT  PATIENT: Renee [name], Renee [name]  MR#: [id number] BIRTHDATE: 06-28-1961 , 50  yrs. old GENDER: Female ENDOSCOPIST: Rachael Fee, MD REFERRED [id number] Felicity Coyer, M.D. PROCEDURE DATE:  03/11/2012 PROCEDURE:   Colonoscopy, diagnostic ASA CLASS:   Class II INDICATIONS: FOB positive stool, last colonoscopy 5 years ago. MEDICATIONS: Fentanyl 75 mcg IV, Versed 8 mg IV, and These medications were titrated to patient response per physician's verbal order  DESCRIPTION OF PROCEDURE:   After the risks benefits and alternatives of the procedure were thoroughly explained, informed consent was obtained.  A digital rectal exam revealed no rectal mass.   The LB CF-[id number]  endoscope was introduced through the anus and advanced to the cecum, which was identified by both the appendix and ileocecal valve. No adverse events experienced. The quality of the prep was good.  The instrument was then slowly withdrawn as the colon was fully examined.    COLON FINDINGS: A normal appearing cecum, ileocecal valve, and appendiceal orifice were identified.  The ascending, hepatic flexure, transverse, splenic flexure, descending, sigmoid colon and rectum appeared unremarkable.  No polyps or cancers were seen. Retroflexed views revealed no abnormalities. The time to cecum=4 minutes 11 seconds.  Withdrawal time=12 minutes 51 seconds.  The scope was withdrawn and the procedure completed. COMPLICATIONS: There were no complications.  ENDOSCOPIC IMPRESSION: Normal colon, no poltyps or cancers   RECOMMENDATIONS: Dr.  Christella Hartigan' office will try again to get your previous colonoscopy records [place], [place] about 5 years ago) to more accurately advise on the timing of your next colonoscopy.  It will be at least 5 years, possibly 10 depending on the exact pathology of previous polyp that was removed.    eSigned:   Rachael Fee, MD 03/11/2012 9:27 AM

## 2012-03-11 NOTE — Progress Notes (Signed)
Patient did not experience any of the following events: a burn prior to discharge; a fall within the facility; wrong site/side/patient/procedure/implant event; or a hospital transfer or hospital admission upon discharge from the facility. (G8907) Patient did not have preoperative order for IV antibiotic SSI prophylaxis. (G8918)  

## 2012-03-11 NOTE — Progress Notes (Signed)
The pt tolerated the colonoscopy very well. maw 

## 2012-03-11 NOTE — Patient Instructions (Addendum)

## 2012-03-12 ENCOUNTER — Encounter: Payer: Self-pay | Admitting: Internal Medicine

## 2012-03-12 ENCOUNTER — Telehealth: Payer: Self-pay | Admitting: *Deleted

## 2012-03-12 NOTE — Telephone Encounter (Signed)
Left message on number given in admitting yesterday as directed. ewm

## 2012-04-06 ENCOUNTER — Encounter: Payer: Self-pay | Admitting: Internal Medicine

## 2012-04-06 DIAGNOSIS — M8580 Other specified disorders of bone density and structure, unspecified site: Secondary | ICD-10-CM | POA: Insufficient documentation

## 2012-04-21 ENCOUNTER — Telehealth: Payer: Self-pay

## 2012-04-21 NOTE — Telephone Encounter (Signed)
Patient called [facility] requesting lab results from recent bone density scan. Thanks

## 2012-04-21 NOTE — Telephone Encounter (Signed)
Pt informed of results of DEXA scan and of MD's advisement.

## 2012-04-21 NOTE — Telephone Encounter (Signed)
  See below - ok to relay info/results to pt as here - thanks   Notes Recorded by Carin Primrose, CMA on 04/09/2012 at 11:08 AM Left message for pt to callback office.  ------  Notes Recorded by Newt Lukes, MD on 04/06/2012 at 9:01 PM Mild bone loss on DEXA: -1.0 = osteopenia Please instruct pt on same: no med tx change recommended but continue Ca 1200mg /d + Vit D 1000U/d with weight bearing exercise daily like walking for 30 min - thanks

## 2012-06-20 ENCOUNTER — Other Ambulatory Visit: Payer: Self-pay | Admitting: Internal Medicine

## 2012-07-20 ENCOUNTER — Other Ambulatory Visit: Payer: Self-pay | Admitting: Internal Medicine

## 2012-08-12 ENCOUNTER — Encounter: Payer: Self-pay | Admitting: Internal Medicine

## 2012-08-12 ENCOUNTER — Other Ambulatory Visit (INDEPENDENT_AMBULATORY_CARE_PROVIDER_SITE_OTHER): Payer: Federal, State, Local not specified - PPO

## 2012-08-12 ENCOUNTER — Ambulatory Visit (INDEPENDENT_AMBULATORY_CARE_PROVIDER_SITE_OTHER): Payer: Federal, State, Local not specified - PPO | Admitting: Internal Medicine

## 2012-08-12 ENCOUNTER — Ambulatory Visit: Payer: 59 | Admitting: Internal Medicine

## 2012-08-12 VITALS — BP 112/72 | HR 75 | Temp 98.9°F | Ht 68.0 in | Wt 149.0 lb

## 2012-08-12 DIAGNOSIS — F329 Major depressive disorder, single episode, unspecified: Secondary | ICD-10-CM

## 2012-08-12 DIAGNOSIS — R002 Palpitations: Secondary | ICD-10-CM

## 2012-08-12 DIAGNOSIS — J329 Chronic sinusitis, unspecified: Secondary | ICD-10-CM

## 2012-08-12 DIAGNOSIS — F32A Depression, unspecified: Secondary | ICD-10-CM

## 2012-08-12 DIAGNOSIS — F3289 Other specified depressive episodes: Secondary | ICD-10-CM

## 2012-08-12 DIAGNOSIS — K589 Irritable bowel syndrome without diarrhea: Secondary | ICD-10-CM

## 2012-08-12 LAB — TSH: TSH: 1.33 u[IU]/mL (ref 0.35–5.50)

## 2012-08-12 LAB — CBC
HCT: 38.3 % (ref 36.0–46.0)
Hemoglobin: 13 g/dL (ref 12.0–15.0)
MCHC: 34 g/dL (ref 30.0–36.0)
RDW: 13.3 % (ref 11.5–14.6)

## 2012-08-12 MED ORDER — MOMETASONE FUROATE 50 MCG/ACT NA SUSP: 2.0000 | Freq: Every day | NASAL | Status: DC

## 2012-08-12 MED ORDER — CETIRIZINE HCL 10 MG PO TABS: 10.0000 mg | ORAL_TABLET | Freq: Every day | ORAL | Status: DC

## 2012-08-12 NOTE — Progress Notes (Signed)
  Subjective:    Patient ID: Renee [name], female    DOB: 23-Jul-1960, 52 y.o.   MRN: [id number]  HPI  patient is here today follow up - reviewed chronic medical issues:  IBS - GI eval for same 01/2012 with colo 03/2012: normal - continued alternating bowels - usually constipated, now diarrhea  Depression - on SSRI since 2010 with good symptoms relief - has seen psyc intermittently during the past years. Working to wean off sertraline last dose change 10/2011 - Denies adverse side effects.   Trigger fingers - R hand requiring surgical release in 2011 after failure to respond to steroids - then recurrent symptoms R 4th finger (injection 12/2011) and L thumb (surgery 01/2012) -    Past Medical History  Diagnosis Date  . Allergic rhinitis, cause unspecified   . Migraines     during teenage years  . Hx of colonic polyps 2008  . IBS (irritable bowel syndrome)   . Arthritis     both knees, post traumatic - neck  . Depression     with anxiety.  . Infertility, female     failed 3 attempts at in vitro fertilization  . MVP (mitral valve prolapse)     last 2D echo approx '10  . Abnormal chest x-ray     CXR c/w COPD. Full PFTs '11 - normal  . Depression   . Osteopenia 04/06/2012    DEXA 03/2012: -1.0    Review of Systems  Constitutional: Negative for fever or weight change. positive for fatigue Respiratory: Negative for cough and shortness of breath.   Cardiovascular: Negative for chest pain -occassional palpitations, none at this time.      Objective:   Physical Exam  BP 112/72  Pulse 75  Temp(Src) 98.9 F (37.2 C) (Oral)  Ht 5\' 8"  (1.727 m)  Wt 149 lb (67.586 kg)  BMI 22.66 kg/m2  SpO2 96% Wt Readings from Last 3 Encounters:  08/12/12 149 lb (67.586 kg)  03/11/12 143 lb (64.864 kg)  02/28/12 143 lb (64.864 kg)   Constitutional: She appears well-developed and well-nourished. No distress.  Neck: Normal range of motion. Neck supple. No JVD present. No thyromegaly or goiter  present.  Cardiovascular: Normal rate, regular rhythm and normal heart sounds.  No murmur heard. No BLE edema. Pulmonary/Chest: Effort normal and breath sounds normal. No respiratory distress. She has no wheezes.  Abdominal: Soft. Bowel sounds are normal. She exhibits no distension. There is no tenderness. no masses Psychiatric: She has a normal mood and affect. Her behavior is normal. Judgment and thought content normal.   Lab Results  Component Value Date   WBC 4.8 09/19/2010   HGB 13.4 09/19/2010   HCT 38.9 09/19/2010   PLT 188.0 09/19/2010   GLUCOSE 86 02/10/2012   CHOL 214* 02/10/2012   TRIG 104.0 02/10/2012   HDL 76.70 02/10/2012   LDLDIRECT 120.1 02/10/2012   ALT 15 02/10/2012   AST 19 02/10/2012   NA 139 02/10/2012   K 4.6 02/10/2012   CL 101 02/10/2012   CREATININE 0.9 02/10/2012   BUN 17 02/10/2012   CO2 31 02/10/2012   TSH 2.82 02/10/2012       Assessment & Plan:   palpitations - long hx same - prior ECG 01/2012 unremarkable - check TSH and CBC now - consider low dose beta-blocker but denies symptoms to warrant same tx

## 2012-08-12 NOTE — Assessment & Plan Note (Signed)
Nocturnal congestion when supine Prior chronic Afrin use, has weaned off same Continue antihistamine, change claritin to zyrtec, continue nasal steroid -change flonase to nasonex and remain off Afrin - s/p ENT eval - deviated septum - considering surgery for same

## 2012-08-12 NOTE — Assessment & Plan Note (Signed)
On SSRI since 2010 - started for irritability with perimenopausal symptoms (hot flashes) Would like to slowly wean off same -  Dropped from sertraline 150>125 in 10/2011 - stable at this time, no additional change recommended for now Also works with [employer] health Darl Pikes [place]) prn

## 2012-08-12 NOTE — Patient Instructions (Signed)
It was good to see you today. Test(s) ordered today. Your results will be released to MyChart (or called to you) after review, usually within 72hours after test completion. If any changes need to be made, you will be notified at that same time. Use Nasonex spray in place of generic Flonase and start Zyrtec daily - Your prescription(s) have been submitted to your pharmacy. Please take as directed and contact our office if you believe you are having problem(s) with the medication(s). follow up with Dr Jearld Fenton as needed Resume probiotic daily for next 30 days and let us know if GI symptoms still unimproved, sooner if worse Please schedule followup in 6-9 months for medical physical and labs, call sooner if problems.

## 2012-08-12 NOTE — Assessment & Plan Note (Signed)
S/p colo 03/2012 for same - Currently in diarrhea phase - usually constipated No red flags on hx/exam today - no fever, blood, weight loss, abdominal pain  Ok to resume probiotic trial x 30 days, call if worse or unimproved

## 2012-09-16 ENCOUNTER — Other Ambulatory Visit: Payer: Self-pay | Admitting: Internal Medicine

## 2012-11-29 HISTORY — PX: NASAL SEPTUM SURGERY: SHX37

## 2013-01-07 ENCOUNTER — Other Ambulatory Visit: Payer: Self-pay | Admitting: Otolaryngology

## 2013-01-07 DIAGNOSIS — E079 Disorder of thyroid, unspecified: Secondary | ICD-10-CM

## 2013-01-11 ENCOUNTER — Ambulatory Visit
Admission: RE | Admit: 2013-01-11 | Discharge: 2013-01-11 | Disposition: A | Discharge status: Home / Self Care | Payer: Federal, State, Local not specified - PPO | Source: Ambulatory Visit | Attending: Otolaryngology | Admitting: Otolaryngology

## 2013-01-11 DIAGNOSIS — E079 Disorder of thyroid, unspecified: Secondary | ICD-10-CM

## 2013-02-09 ENCOUNTER — Encounter: Payer: Self-pay | Admitting: Internal Medicine

## 2013-02-09 ENCOUNTER — Ambulatory Visit (INDEPENDENT_AMBULATORY_CARE_PROVIDER_SITE_OTHER): Payer: Federal, State, Local not specified - PPO | Admitting: Internal Medicine

## 2013-02-09 VITALS — BP 110/78 | HR 72 | Temp 98.2°F | Wt 142.1 lb

## 2013-02-09 DIAGNOSIS — M21612 Bunion of left foot: Secondary | ICD-10-CM

## 2013-02-09 DIAGNOSIS — Z0000 Encounter for general adult medical examination without abnormal findings: Secondary | ICD-10-CM

## 2013-02-09 DIAGNOSIS — K589 Irritable bowel syndrome without diarrhea: Secondary | ICD-10-CM

## 2013-02-09 DIAGNOSIS — M21619 Bunion of unspecified foot: Secondary | ICD-10-CM

## 2013-02-09 DIAGNOSIS — F3289 Other specified depressive episodes: Secondary | ICD-10-CM

## 2013-02-09 DIAGNOSIS — F32A Depression, unspecified: Secondary | ICD-10-CM

## 2013-02-09 DIAGNOSIS — F329 Major depressive disorder, single episode, unspecified: Secondary | ICD-10-CM

## 2013-02-09 NOTE — Assessment & Plan Note (Signed)
On SSRI since 2010 - started for irritability with perimenopausal symptoms (hot flashes) Would like to slowly wean off same -  Dropped from sertraline 150 qd to 125 qd in 10/2011 - stable at this time, no additional change recommended for now Consider decrease to 100mg  qd 08/2013 if symptoms remain stable Also works with [employer] health Renee [name]) prn

## 2013-02-09 NOTE — Assessment & Plan Note (Signed)
S/p colo 03/2012 for same - In diarrhea phase for last 78mo, previously "usually" constipated No red flags on hx/exam today - no fever, blood, weight loss, abdominal pain  ?related to change SSRI dose summer 2013 Ok to resume probiotic trial x 30 days, call if worse or unimproved

## 2013-02-09 NOTE — Progress Notes (Signed)
Subjective:    Patient ID: Renee [name], female    DOB: 1960-07-17, 52 y.o.   MRN: [id number]  HPI patient is here today for annual physical. Patient feels well overall  Also reviewed chronic medical issues:  IBS - GI eval for same 01/2012 with colo 03/2012: normal - continued alternating bowels - usually constipated, now diarrhea  Depression - on SSRI since 2010 with good symptoms relief - has seen psyc intermittently during the past years. Working to wean off sertraline last dose change 10/2011 - Denies adverse side effects.   Trigger fingers - R hand requiring surgical release in 2011 after failure to respond to steroids - then recurrent symptoms R 4th finger (injection 12/2011) and L thumb (surgery 01/2012) -    Past Medical History  Diagnosis Date  . Allergic rhinitis, cause unspecified   . Migraines     during teenage years  . Hx of colonic polyps 2008  . IBS (irritable bowel syndrome)   . Arthritis     both knees, post traumatic - neck  . Depression     with anxiety.  . Infertility, female     failed 3 attempts at in vitro fertilization  . MVP (mitral valve prolapse)     last 2D echo approx '10  . Abnormal chest x-ray     CXR c/w COPD. Full PFTs '11 - normal  . Depression   . Osteopenia 04/06/2012    DEXA 03/2012: -1.0   Family History  Problem Relation Age of Onset  . Arthritis Mother     DOB 58  . Cancer Mother     Thyroid cancer  . Hypertension Mother   . Atrial fibrillation Mother   . Melanoma Mother   . Breast cancer Mother   . Arthritis Father     DOB 1929  . Melanoma Father   . Colon polyps Neg Hx   . Rectal cancer Neg Hx   . Stomach cancer Neg Hx    History  Substance Use Topics  . Smoking status: Never Smoker   . Smokeless tobacco: Never Used  . Alcohol Use: No    Review of Systems Constitutional: Negative for fever or weight change.  Respiratory: Negative for cough and shortness of breath.   Cardiovascular: Negative for chest pain or  palpitations.  Gastrointestinal: Negative for abdominal pain, no bowel changes.  Musculoskeletal: Negative for gait problem or joint swelling.  Skin: Negative for rash.  Neurological: Negative for dizziness or headache.  No other specific complaints in a complete review of systems (except as listed in HPI above).      Objective:   Physical Exam  BP 110/78  Pulse 72  Temp(Src) 98.2 F (36.8 C) (Oral)  Wt 142 lb 1.9 oz (64.465 kg)  BMI 21.61 kg/m2  SpO2 97% Wt Readings from Last 3 Encounters:  02/09/13 142 lb 1.9 oz (64.465 kg)  08/12/12 149 lb (67.586 kg)  03/11/12 143 lb (64.864 kg)   Constitutional: She appears well-developed and well-nourished. No distress.  HENT: Head: Normocephalic and atraumatic. Ears: B TMs ok, no erythema or effusion; Nose: Nose normal. Mouth/Throat: Oropharynx is clear and moist. No oropharyngeal exudate.  Eyes: Conjunctivae and EOM are normal. Pupils are equal, round, and reactive to light. No scleral icterus.  Neck: Normal range of motion. Neck supple. No JVD present. No thyromegaly present.  Cardiovascular: Normal rate, regular rhythm and normal heart sounds.  No murmur heard. No BLE edema. Pulmonary/Chest: Effort normal and breath sounds normal. No respiratory  distress. She has no wheezes.  Abdominal: Soft. Bowel sounds are normal. She exhibits no distension. There is no tenderness. no masses Musculoskeletal: B bunion foot deformities. Normal range of motion, no joint effusions. No gross deformities Neurological: She is alert and oriented to person, place, and time. No cranial nerve deficit. Coordination, balance, strength, speech and gait are normal.  Skin: Skin is warm and dry. No rash noted. No erythema.  Psychiatric: She has a normal mood and affect. Her behavior is normal. Judgment and thought content normal.    Lab Results  Component Value Date   WBC 4.2* 08/12/2012   HGB 13.0 08/12/2012   HCT 38.3 08/12/2012   PLT 184.0 08/12/2012   GLUCOSE  86 02/10/2012   CHOL 214* 02/10/2012   TRIG 104.0 02/10/2012   HDL 76.70 02/10/2012   LDLDIRECT 120.1 02/10/2012   ALT 15 02/10/2012   AST 19 02/10/2012   NA 139 02/10/2012   K 4.6 02/10/2012   CL 101 02/10/2012   CREATININE 0.9 02/10/2012   BUN 17 02/10/2012   CO2 31 02/10/2012   TSH 1.33 08/12/2012       Assessment & Plan:   CPX/v70.0 - Patient has been counseled on age-appropriate routine health concerns for screening and prevention. These are reviewed and up-to-date. Immunizations are up-to-date or declined. Labs ordered and reviewed.  See problem list. Medications and labs reviewed today.  L foot bunion, increasingly symptomatic with pain - refer to foot specialist for further eval and tx as needed

## 2013-02-09 NOTE — Patient Instructions (Signed)
It was good to see you today. We have reviewed your prior records including labs and tests today Medications reviewed and updated, no changes recommended at this time. Consider decrease in sertraline 08/2013 if everything else ok Health Maintenance reviewed - all recommended immunizations and age-appropriate screenings are up-to-date. Test(s) ordered today. Your results will be released to MyChart (or called to you) after review, usually within 72hours after test completion. If any changes need to be made, you will be notified at that same time. we'll make referral to foot specialist for your bunion . Our office will contact you regarding appointment(s) once made. Please schedule followup in 6 months, call sooner if problems. Health Maintenance, Females A healthy lifestyle and preventative care can promote health and wellness.  Maintain regular health, dental, and eye exams.  Eat a healthy diet. Foods like vegetables, fruits, whole grains, low-fat dairy products, and lean protein foods contain the nutrients you need without too many calories. Decrease your intake of foods high in solid fats, added sugars, and salt. Get information about a proper diet from your caregiver, if necessary.  Regular physical exercise is one of the most important things you can do for your health. Most adults should get at least 150 minutes of moderate-intensity exercise (any activity that increases your heart rate and causes you to sweat) each week. In addition, most adults need muscle-strengthening exercises on 2 or more days a week.   Maintain a healthy weight. The body mass index (BMI) is a screening tool to identify possible weight problems. It provides an estimate of body fat based on height and weight. Your caregiver can help determine your BMI, and can help you achieve or maintain a healthy weight. For adults 20 years and older:  A BMI below 18.5 is considered underweight.  A BMI of 18.5 to 24.9 is normal.  A  BMI of 25 to 29.9 is considered overweight.  A BMI of 30 and above is considered obese.  Maintain normal blood lipids and cholesterol by exercising and minimizing your intake of saturated fat. Eat a balanced diet with plenty of fruits and vegetables. Blood tests for lipids and cholesterol should begin at age 77 and be repeated every 5 years. If your lipid or cholesterol levels are high, you are over 50, or you are a high risk for heart disease, you may need your cholesterol levels checked more frequently.Ongoing high lipid and cholesterol levels should be treated with medicines if diet and exercise are not effective.  If you smoke, find out from your caregiver how to quit. If you do not use tobacco, do not start.  If you are pregnant, do not drink alcohol. If you are breastfeeding, be very cautious about drinking alcohol. If you are not pregnant and choose to drink alcohol, do not exceed 1 drink per day. One drink is considered to be 12 ounces (355 mL) of beer, 5 ounces (148 mL) of wine, or 1.5 ounces (44 mL) of liquor.  Avoid use of street drugs. Do not share needles with anyone. Ask for help if you need support or instructions about stopping the use of drugs.  High blood pressure causes heart disease and increases the risk of stroke. Blood pressure should be checked at least every 1 to 2 years. Ongoing high blood pressure should be treated with medicines, if weight loss and exercise are not effective.  If you are 66 to 52 years old, ask your caregiver if you should take aspirin to prevent strokes.  Diabetes  screening involves taking a blood sample to check your fasting blood sugar level. This should be done once every 3 years, after age 52, if you are within normal weight and without risk factors for diabetes. Testing should be considered at a younger age or be carried out more frequently if you are overweight and have at least 1 risk factor for diabetes.  Breast cancer screening is essential  preventative care for women. You should practice "breast self-awareness." This means understanding the normal appearance and feel of your breasts and may include breast self-examination. Any changes detected, no matter how small, should be reported to a caregiver. Women in their 63s and 30s should have a clinical breast exam (CBE) by a caregiver as part of a regular health exam every 1 to 3 years. After age 70, women should have a CBE every year. Starting at age 46, women should consider having a mammogram (breast X-ray) every year. Women who have a family history of breast cancer should talk to their caregiver about genetic screening. Women at a high risk of breast cancer should talk to their caregiver about having an MRI and a mammogram every year.  The Pap test is a screening test for cervical cancer. Women should have a Pap test starting at age 45. Between ages 80 and 77, Pap tests should be repeated every 2 years. Beginning at age 16, you should have a Pap test every 3 years as long as the past 3 Pap tests have been normal. If you had a hysterectomy for a problem that was not cancer or a condition that could lead to cancer, then you no longer need Pap tests. If you are between ages 50 and 15, and you have had normal Pap tests going back 10 years, you no longer need Pap tests. If you have had past treatment for cervical cancer or a condition that could lead to cancer, you need Pap tests and screening for cancer for at least 20 years after your treatment. If Pap tests have been discontinued, risk factors (such as a new sexual partner) need to be reassessed to determine if screening should be resumed. Some women have medical problems that increase the chance of getting cervical cancer. In these cases, your caregiver may recommend more frequent screening and Pap tests.  The human papillomavirus (HPV) test is an additional test that may be used for cervical cancer screening. The HPV test looks for the virus that  can cause the cell changes on the cervix. The cells collected during the Pap test can be tested for HPV. The HPV test could be used to screen women aged 18 years and older, and should be used in women of any age who have unclear Pap test results. After the age of 26, women should have HPV testing at the same frequency as a Pap test.  Colorectal cancer can be detected and often prevented. Most routine colorectal cancer screening begins at the age of 62 and continues through age 20. However, your caregiver may recommend screening at an earlier age if you have risk factors for colon cancer. On a yearly basis, your caregiver may provide home test kits to check for hidden blood in the stool. Use of a small camera at the end of a tube, to directly examine the colon (sigmoidoscopy or colonoscopy), can detect the earliest forms of colorectal cancer. Talk to your caregiver about this at age 62, when routine screening begins. Direct examination of the colon should be repeated every 5 to  10 years through age 83, unless early forms of pre-cancerous polyps or small growths are found.  Hepatitis C blood testing is recommended for all people born from [birth year] through 1965 and any individual with known risks for hepatitis C.  Practice safe sex. Use condoms and avoid high-risk sexual practices to reduce the spread of sexually transmitted infections (STIs). Sexually active women aged 70 and younger should be checked for Chlamydia, which is a common sexually transmitted infection. Older women with new or multiple partners should also be tested for Chlamydia. Testing for other STIs is recommended if you are sexually active and at increased risk.  Osteoporosis is a disease in which the bones lose minerals and strength with aging. This can result in serious bone fractures. The risk of osteoporosis can be identified using a bone density scan. Women ages 36 and over and women at risk for fractures or osteoporosis should discuss  screening with their caregivers. Ask your caregiver whether you should be taking a calcium supplement or vitamin D to reduce the rate of osteoporosis.  Menopause can be associated with physical symptoms and risks. Hormone replacement therapy is available to decrease symptoms and risks. You should talk to your caregiver about whether hormone replacement therapy is right for you.  Use sunscreen with a sun protection factor (SPF) of 30 or greater. Apply sunscreen liberally and repeatedly throughout the day. You should seek shade when your shadow is shorter than you. Protect yourself by wearing long sleeves, pants, a wide-brimmed hat, and sunglasses year round, whenever you are outdoors.  Notify your caregiver of new moles or changes in moles, especially if there is a change in shape or color. Also notify your caregiver if a mole is larger than the size of a pencil eraser.  Stay current with your immunizations. Document Released: 12/31/2010 Document Revised: 09/09/2011 Document Reviewed: 12/31/2010 [facility] Patient Information 2014 [place], [place]. Bunion You have a bunion deformity of the feet. This is more common in women. It tends to be an inherited problem. Symptoms can include pain, swelling, and deformity around the great toe. Numbness and tingling may also be present. Your symptoms are often worsened by wearing shoes that cause pressure on the bunion. Changing the type of shoes you wear helps reduce symptoms. A wide shoe decreases pressure on the bunion. An arch support may be used if you have flat feet. Avoid shoes with heels higher than two inches. This puts more pressure on the bunion. X-rays may be helpful in evaluating the severity of the problem. Other foot problems often seen with bunions include corns, calluses, and hammer toes. If the deformity or pain is severe, surgical treatment may be necessary. Keep off your painful foot as much as possible until the pain is relieved. Call your  caregiver if your symptoms are worse.  SEEK IMMEDIATE MEDICAL CARE IF:  You have increased redness, pain, swelling, or other symptoms of infection. Document Released: 06/17/2005 Document Revised: 09/09/2011 Document Reviewed: 12/15/2006 [facility] Patient Information 2014 [place], [place].

## 2013-02-18 ENCOUNTER — Other Ambulatory Visit: Payer: Self-pay | Admitting: Internal Medicine

## 2013-04-09 ENCOUNTER — Encounter: Payer: Self-pay | Admitting: Internal Medicine

## 2013-04-09 ENCOUNTER — Other Ambulatory Visit (INDEPENDENT_AMBULATORY_CARE_PROVIDER_SITE_OTHER): Payer: Federal, State, Local not specified - PPO

## 2013-04-09 DIAGNOSIS — E785 Hyperlipidemia, unspecified: Secondary | ICD-10-CM | POA: Insufficient documentation

## 2013-04-09 DIAGNOSIS — Z0000 Encounter for general adult medical examination without abnormal findings: Secondary | ICD-10-CM

## 2013-04-09 LAB — CBC WITH DIFFERENTIAL/PLATELET
Eosinophils Absolute: 0.2 10*3/uL (ref 0.0–0.7)
Eosinophils Relative: 4.1 % (ref 0.0–5.0)
HCT: 38.8 % (ref 36.0–46.0)
Lymphs Abs: 1.4 10*3/uL (ref 0.7–4.0)
MCHC: 34 g/dL (ref 30.0–36.0)
MCV: 87.7 fl (ref 78.0–100.0)
Monocytes Absolute: 0.3 10*3/uL (ref 0.1–1.0)
Neutrophils Relative %: 59.4 % (ref 43.0–77.0)
Platelets: 185 10*3/uL (ref 150.0–400.0)
RDW: 13.1 % (ref 11.5–14.6)
WBC: 4.7 10*3/uL (ref 4.5–10.5)

## 2013-04-09 LAB — URINALYSIS, ROUTINE W REFLEX MICROSCOPIC
Bilirubin Urine: NEGATIVE
Hgb urine dipstick: NEGATIVE
Nitrite: NEGATIVE
Total Protein, Urine: NEGATIVE
Urobilinogen, UA: 0.2 (ref 0.0–1.0)

## 2013-04-09 LAB — HEPATIC FUNCTION PANEL
Alkaline Phosphatase: 63 U/L (ref 39–117)
Bilirubin, Direct: 0.1 mg/dL (ref 0.0–0.3)
Total Bilirubin: 0.6 mg/dL (ref 0.3–1.2)
Total Protein: 7.6 g/dL (ref 6.0–8.3)

## 2013-04-09 LAB — LIPID PANEL
HDL: 75.8 mg/dL (ref >39.00)
VLDL: 10 mg/dL (ref 0.0–40.0)

## 2013-04-09 LAB — TSH: TSH: 3.09 u[IU]/mL (ref 0.35–5.50)

## 2013-04-09 LAB — BASIC METABOLIC PANEL
BUN: 15 mg/dL (ref 6–23)
CO2: 29 mEq/L (ref 19–32)
Chloride: 105 mEq/L (ref 96–112)
Creatinine, Ser: 0.9 mg/dL (ref 0.4–1.2)
Glucose, Bld: 97 mg/dL (ref 70–99)
Potassium: 4.3 mEq/L (ref 3.5–5.1)

## 2013-05-19 ENCOUNTER — Telehealth: Payer: Self-pay | Admitting: *Deleted

## 2013-05-19 MED ORDER — ALPRAZOLAM 0.25 MG PO TABS: 0.2500 mg | ORAL_TABLET | Freq: Every evening | ORAL | Status: DC | PRN

## 2013-05-19 NOTE — Telephone Encounter (Signed)
Done hardcopy to robin  

## 2013-05-19 NOTE — Telephone Encounter (Signed)
Pt called states she is scheduled to fly to [place] on 11.24.14 and return on 12.2.14.  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  Please advise in Dr Diamantina Monks absence

## 2013-05-19 NOTE — Telephone Encounter (Signed)
Spoke to pt advised Rx vaxed

## 2013-06-16 ENCOUNTER — Other Ambulatory Visit: Payer: Self-pay | Admitting: *Deleted

## 2013-06-16 ENCOUNTER — Other Ambulatory Visit: Payer: Self-pay | Admitting: Internal Medicine

## 2013-06-16 MED ORDER — SERTRALINE HCL 25 MG PO TABS: ORAL_TABLET | ORAL | Status: DC

## 2013-06-16 MED ORDER — SERTRALINE HCL 100 MG PO TABS: 100.0000 mg | ORAL_TABLET | Freq: Every day | ORAL | Status: DC

## 2013-07-06 HISTORY — PX: BUNIONECTOMY WITH HAMMERTOE RECONSTRUCTION: SHX5600

## 2013-08-12 ENCOUNTER — Encounter: Payer: Self-pay | Admitting: Internal Medicine

## 2013-08-12 ENCOUNTER — Ambulatory Visit (INDEPENDENT_AMBULATORY_CARE_PROVIDER_SITE_OTHER): Payer: Federal, State, Local not specified - PPO | Admitting: Internal Medicine

## 2013-08-12 ENCOUNTER — Encounter: Payer: Self-pay | Admitting: *Deleted

## 2013-08-12 VITALS — BP 102/72 | HR 90 | Temp 98.9°F | Wt 148.0 lb

## 2013-08-12 DIAGNOSIS — M7710 Lateral epicondylitis, unspecified elbow: Secondary | ICD-10-CM

## 2013-08-12 DIAGNOSIS — M7712 Lateral epicondylitis, left elbow: Secondary | ICD-10-CM

## 2013-08-12 DIAGNOSIS — N959 Unspecified menopausal and perimenopausal disorder: Secondary | ICD-10-CM

## 2013-08-12 DIAGNOSIS — F32A Depression, unspecified: Secondary | ICD-10-CM

## 2013-08-12 DIAGNOSIS — F3289 Other specified depressive episodes: Secondary | ICD-10-CM

## 2013-08-12 DIAGNOSIS — K589 Irritable bowel syndrome without diarrhea: Secondary | ICD-10-CM

## 2013-08-12 DIAGNOSIS — F329 Major depressive disorder, single episode, unspecified: Secondary | ICD-10-CM

## 2013-08-12 NOTE — Progress Notes (Signed)
Subjective:    Patient ID: Renee [name], female    DOB: 10-14-1960, 53 y.o.   MRN: [id number]  HPI  Patient here for followup.  Reviewed chronic medical issues interval medical events  IBS - GI eval for same 01/2012 with colo 03/2012: normal - continued alternating bowels - usually constipated, now diarrhea  Depression - on SSRI since 2010 with good symptoms relief - has seen psyc intermittently during the past years. denies adverse side effects.   Trigger fingers - R hand requiring surgical release in 2011 after failure to respond to steroids - then recurrent symptoms R 4th finger (injection 12/2011) and L thumb (surgery 01/2012) -   Reports lateral left elbow pain approximately 6 months. Precipitated by overuse with moving. Associated with swelling, weakness, numbness. Has not tried over-the-counter medications, ice or rest. Denies history of same   Past Medical History  Diagnosis Date  . Allergic rhinitis, cause unspecified   . Migraines     during teenage years  . Hx of colonic polyps 2008  . IBS (irritable bowel syndrome)   . Arthritis     both knees, post traumatic - neck  . Depression     with anxiety.  . Infertility, female     failed 3 attempts at in vitro fertilization  . MVP (mitral valve prolapse)     last 2D echo approx '10  . Abnormal chest x-ray     CXR c/w COPD. Full PFTs '11 - normal  . Depression   . Osteopenia 04/06/2012    DEXA 03/2012: -1.0  . Dyslipidemia 04/09/2013    Review of Systems  Constitutional: Negative for fever, fatigue and unexpected weight change.  Respiratory: Negative for cough and shortness of breath.   Cardiovascular: Negative for chest pain and leg swelling.        Objective:   Physical Exam BP 102/72  Pulse 90  Temp(Src) 98.9 F (37.2 C) (Oral)  Wt 148 lb (67.132 kg)  SpO2 98% Wt Readings from Last 3 Encounters:  08/12/13 148 lb (67.132 kg)  02/09/13 142 lb 1.9 oz (64.465 kg)  08/12/12 149 lb (67.586 kg)    Constitutional: She appears well-developed and well-nourished. No distress.  Neck: Normal range of motion. Neck supple. No JVD present. No thyromegaly present.  Cardiovascular: Normal rate, regular rhythm and normal heart sounds.  No murmur heard. No BLE edema. Pulmonary/Chest: Effort normal and breath sounds normal. No respiratory distress. She has no wheezes.  Musculoskeletal: L elbow: Elbow: tender to palpation over lateral epicondyle. Pain with resistance to ECRB extension, wrist extension and supination. FROM, no effusion, redness or swelling. Neurovascularly intact. No open wounds. Neurological: She is alert and oriented to person, place, and time. No cranial nerve deficit. Coordination, balance, strength, speech and gait are normal.  Skin: Skin is warm and dry. No rash noted. No erythema.  Psychiatric: She has a normal mood and affect. Her behavior is normal. Judgment and thought content normal.    Lab Results  Component Value Date   WBC 4.7 04/09/2013   HGB 13.2 04/09/2013   HCT 38.8 04/09/2013   PLT 185.0 04/09/2013   GLUCOSE 97 04/09/2013   CHOL 232* 04/09/2013   TRIG 50.0 04/09/2013   HDL 75.80 04/09/2013   LDLDIRECT 143.1 04/09/2013   ALT 13 04/09/2013   AST 15 04/09/2013   NA 142 04/09/2013   K 4.3 04/09/2013   CL 105 04/09/2013   CREATININE 0.9 04/09/2013   BUN 15 04/09/2013   CO2 29 04/09/2013  TSH 3.09 04/09/2013       Assessment & Plan:   L foot bunion s/p surgical repair 07/06/13 due to increasing pain symptoms  Lateral epicondylitis, left side. Precipitated by overuse summer 2014 -education on diagnosis provided. Home physical therapy exercises and conservative over-the-counter anti-inflammatories advised. Patient will try OTC band to extensor surface of forearm -patient agrees to call if symptoms worse or unimproved  Problem List Items Addressed This Visit   Depression - Primary     On SSRI since 2010 - started for irritability with perimenopausal  symptoms (hot flashes) Would like to slowly wean off same -  Dropped from sertraline 150 qd to 125 qd in 10/2011 - stable at this time, no additional change recommended for now Consider future decrease to 100mg  qd if symptoms remain stable Also works with [employer] [employer] [employer]) prn    IBS (irritable bowel syndrome)     S/p colo 03/2012 for same -symptoms stable  The current medical regimen is effective;  continue present plan and medications.     Relevant Medications      omeprazole (PRILOSEC) 20 MG capsule   Postmenopausal symptoms    Other Visit Diagnoses   Left tennis elbow

## 2013-08-12 NOTE — Assessment & Plan Note (Signed)
On SSRI since 2010 - started for irritability with perimenopausal symptoms (hot flashes) Would like to slowly wean off same -  Dropped from sertraline 150 qd to 125 qd in 10/2011 - stable at this time, no additional change recommended for now Consider future decrease to 100mg  qd if symptoms remain stable Also works with [employer] [facility]a [employer]) prn

## 2013-08-12 NOTE — Progress Notes (Signed)
Pre-visit discussion using our clinic review tool. No additional management support is needed unless otherwise documented below in the visit note.  

## 2013-08-12 NOTE — Patient Instructions (Addendum)
It was good to see you today.  We have reviewed your prior records including labs and tests today  Medications reviewed and updated, no changes recommended at this time.  Follow treatment for elbow pain as below - let us know if worsening or unimproved symptoms over next several months/weeks  Please schedule followup in 6-9 months for medical physical and labs, call sooner if problems.   Tennis Elbow Your caregiver has diagnosed you with a condition often referred to as "tennis elbow." This results from small tears or soreness (inflammation) at the start (origin) of the extensor muscles of the forearm. Although the condition is often called tennis or golfer's elbow, it is caused by any repetitive action performed by your elbow. HOME CARE INSTRUCTIONS  If the condition has been short lived, rest may be the only treatment required. Using your opposite hand or arm to perform the task may help. Even changing your grip may help rest the extremity. These may even prevent the condition from recurring.  Longer standing problems, however, will often be relieved faster by:  Using anti-inflammatory agents.  Applying ice packs for 30 minutes at the end of the working day, at bed time, or when activities are finished.  Your caregiver may also have you wear a splint or sling. This will allow the inflamed tendon to heal. At times, steroid injections aided with a local anesthetic will be required along with splinting for 1 to 2 weeks. Two to three steroid injections will often solve the problem. In some long standing cases, the inflamed tendon does not respond to conservative (non-surgical) therapy. Then surgery may be required to repair it. MAKE SURE YOU:   Understand these instructions.  Will watch your condition.  Will get help right away if you are not doing well or get worse. Document Released: 06/17/2005 Document Revised: 09/09/2011 Document Reviewed: 02/03/2008 [facility] Patient Information  2014 [place].

## 2013-08-12 NOTE — Assessment & Plan Note (Signed)
S/p colo 03/2012 for same -symptoms stable  The current medical regimen is effective;  continue present plan and medications.

## 2014-02-09 ENCOUNTER — Ambulatory Visit: Payer: Federal, State, Local not specified - PPO | Admitting: Internal Medicine

## 2014-02-21 ENCOUNTER — Other Ambulatory Visit: Payer: Self-pay | Admitting: Internal Medicine

## 2014-03-01 ENCOUNTER — Other Ambulatory Visit: Payer: Self-pay

## 2014-03-01 LAB — HM MAMMOGRAPHY

## 2014-03-01 MED ORDER — SERTRALINE HCL 100 MG PO TABS: 100.0000 mg | ORAL_TABLET | Freq: Every day | ORAL | Status: DC

## 2014-03-09 ENCOUNTER — Ambulatory Visit: Payer: Federal, State, Local not specified - PPO | Admitting: Internal Medicine

## 2014-04-06 ENCOUNTER — Encounter: Payer: Self-pay | Admitting: Internal Medicine

## 2014-04-06 ENCOUNTER — Ambulatory Visit (INDEPENDENT_AMBULATORY_CARE_PROVIDER_SITE_OTHER): Payer: Federal, State, Local not specified - PPO | Admitting: Internal Medicine

## 2014-04-06 VITALS — BP 118/70 | HR 83 | Temp 98.3°F | Ht 68.0 in | Wt 142.5 lb

## 2014-04-06 DIAGNOSIS — Z23 Encounter for immunization: Secondary | ICD-10-CM

## 2014-04-06 DIAGNOSIS — K589 Irritable bowel syndrome without diarrhea: Secondary | ICD-10-CM

## 2014-04-06 DIAGNOSIS — Z0000 Encounter for general adult medical examination without abnormal findings: Secondary | ICD-10-CM

## 2014-04-06 MED ORDER — SERTRALINE HCL 25 MG PO TABS
25.0000 mg | ORAL_TABLET | Freq: Every day | ORAL | Status: DC
Start: 2014-04-06 — End: 2014-11-13

## 2014-04-06 MED ORDER — SERTRALINE HCL 100 MG PO TABS: 100.0000 mg | ORAL_TABLET | Freq: Every day | ORAL | Status: DC

## 2014-04-06 NOTE — Progress Notes (Signed)
Subjective:    Patient ID: Renee [name], female    DOB: 08-25-1960, 53 y.o.   MRN: [id number]  HPI  patient is here today for annual physical. Patient feels well and has no complaints.  Also reviewed chronic medical issues and interval medical events  Past Medical History  Diagnosis Date  . Allergic rhinitis, cause unspecified   . Migraines     during teenage years  . Hx of colonic polyps 2008  . IBS (irritable bowel syndrome)   . Arthritis     both knees, post traumatic - neck  . Depression     with anxiety.  . Infertility, female     failed 3 attempts at in vitro fertilization  . MVP (mitral valve prolapse)     last 2D echo approx '10  . Abnormal chest x-ray     CXR c/w COPD. Full PFTs '11 - normal  . Depression   . Osteopenia 04/06/2012    DEXA 03/2012: -1.0  . Dyslipidemia 04/09/2013   Family History  Problem Relation Age of Onset  . Arthritis Mother     DOB 74  . Cancer Mother     Thyroid cancer  . Hypertension Mother   . Atrial fibrillation Mother   . Melanoma Mother   . Breast cancer Mother   . Arthritis Father     DOB 1929  . Melanoma Father   . Colon polyps Neg Hx   . Rectal cancer Neg Hx   . Stomach cancer Neg Hx    History  Substance Use Topics  . Smoking status: Never Smoker   . Smokeless tobacco: Never Used  . Alcohol Use: No    Review of Systems  Constitutional: Negative for fatigue and unexpected weight change.  Respiratory: Negative for cough, shortness of breath and wheezing.   Cardiovascular: Negative for chest pain, palpitations and leg swelling.  Gastrointestinal: Positive for diarrhea. Negative for nausea and abdominal pain.  Musculoskeletal: Positive for arthralgias (B knees).  Neurological: Negative for dizziness, weakness, light-headedness and headaches.  Psychiatric/Behavioral: Negative for dysphoric mood. The patient is not nervous/anxious.   All other systems reviewed and are negative.      Objective:   Physical  Exam  BP 118/70  Pulse 83  Temp(Src) 98.3 F (36.8 C) (Oral)  Ht 5\' 8"  (1.727 m)  Wt 142 lb 8 oz (64.638 kg)  BMI 21.67 kg/m2  SpO2 96% Wt Readings from Last 3 Encounters:  04/06/14 142 lb 8 oz (64.638 kg)  08/12/13 148 lb (67.132 kg)  02/09/13 142 lb 1.9 oz (64.465 kg)   Constitutional: She appears well-developed and well-nourished. No distress.  HENT: Head: Normocephalic and atraumatic. Ears: B TMs ok, no erythema or effusion; Nose: Nose normal. Mouth/Throat: Oropharynx is clear and moist. No oropharyngeal exudate.  Eyes: Conjunctivae and EOM are normal. Pupils are equal, round, and reactive to light. No scleral icterus.  Neck: Normal range of motion. Neck supple. No JVD present. No thyromegaly present.  Cardiovascular: Normal rate, regular rhythm and normal heart sounds.  No murmur heard. No BLE edema. Pulmonary/Chest: Effort normal and breath sounds normal. No respiratory distress. She has no wheezes.  Abdominal: Soft. Bowel sounds are normal. She exhibits no distension. There is no tenderness. no masses GU/breast: defer to gyn Musculoskeletal: Normal range of motion, no joint effusions. No gross deformities Neurological: She is alert and oriented to person, place, and time. No cranial nerve deficit. Coordination, balance, strength, speech and gait are normal.  Skin: Skin is  warm and dry. No rash noted. No erythema.  Psychiatric: She has a normal mood and affect. Her behavior is normal. Judgment and thought content normal.    Lab Results  Component Value Date   WBC 4.7 04/09/2013   HGB 13.2 04/09/2013   HCT 38.8 04/09/2013   PLT 185.0 04/09/2013   GLUCOSE 97 04/09/2013   CHOL 232* 04/09/2013   TRIG 50.0 04/09/2013   HDL 75.80 04/09/2013   LDLDIRECT 143.1 04/09/2013   ALT 13 04/09/2013   AST 15 04/09/2013   NA 142 04/09/2013   K 4.3 04/09/2013   CL 105 04/09/2013   CREATININE 0.9 04/09/2013   BUN 15 04/09/2013   CO2 29 04/09/2013   TSH 3.09 04/09/2013    [place] Soft  Tissue Head/neck  01/11/2013   *RADIOLOGY REPORT*  Clinical Data: Thyroid disorder  THYROID ULTRASOUND  Technique: Ultrasound examination of the thyroid gland and adjacent soft tissues was performed.  Comparison:  None available  Findings:  Right thyroid lobe:    44 x 9 x 18 mm, homogeneous echotexture Left thyroid lobe:  40 x 8 x 17 mm with two 3 mm cysts in its upper pole Isthmus:  1.4 mm thickness  Focal nodules:  None  Lymphadenopathy:  None visualized.  IMPRESSION: Normal sized thyroid without focal nodule.   Original Report Authenticated By: D. Wallace Going, MD       Assessment & Plan:   CPX -Patient has been counseled on age-appropriate routine health concerns for screening and prevention. These are reviewed and up-to-date. Immunizations are up-to-date or declined. Labs ordered and reviewed.  Problem List Items Addressed This Visit   IBS (irritable bowel syndrome)     S/p colo 03/2012 for same - Prior chronic constipation, now diarrhea approx 85mo (3-4x/day, soft) Check labs Try dietary exclusion for few weeks - stevia, gluten, lactose     Other Visit Diagnoses   Routine general medical examination at a health care facility    -  Primary    Relevant Orders       Basic metabolic panel       CBC with Differential       Hepatic function panel       Lipid panel       TSH       Urinalysis, Routine w reflex microscopic    Need for prophylactic vaccination and inoculation against influenza        Relevant Orders       Flu Vaccine QUAD 36+ mos PF IM (Fluarix Quad PF)

## 2014-04-06 NOTE — Assessment & Plan Note (Signed)
S/p colo 03/2012 for same - Prior chronic constipation, now diarrhea approx 74mo (3-4x/day, soft) Check labs Try dietary exclusion for few weeks - stevia, gluten, lactose

## 2014-04-06 NOTE — Patient Instructions (Addendum)
It was good to see you today.  We have reviewed your prior records including labs and tests today  Your annual flu shot was given and/or updated today.  Health Maintenance reviewed - all other recommended immunizations and age-appropriate screenings are up-to-date.  Test(s) ordered today. Return when fasting this week or next. Your results will be released to [place] (or called to you) after review, usually within 72hours after test completion. If any changes need to be made, you will be notified at that same time.  Medications reviewed and updated, no changes recommended at this time. Refill on medication(s) as discussed today.  Please schedule followup in 12 months for annual exam and labs, call sooner if problems.  Health Maintenance Adopting a healthy lifestyle and getting preventive care can go a long way to promote health and wellness. Talk with your health care provider about what schedule of regular examinations is right for you. This is a good chance for you to check in with your provider about disease prevention and staying healthy. In between checkups, there are plenty of things you can do on your own. Experts have done a lot of research about which lifestyle changes and preventive measures are most likely to keep you healthy. Ask your health care provider for more information. WEIGHT AND DIET  Eat a healthy diet  Be sure to include plenty of vegetables, fruits, low-fat dairy products, and lean protein.  Do not eat a lot of foods high in solid fats, added sugars, or salt.  Get regular exercise. This is one of the most important things you can do for your health.  Most adults should exercise for at least 150 minutes each week. The exercise should increase your heart rate and make you sweat (moderate-intensity exercise).  Most adults should also do strengthening exercises at least twice a week. This is in addition to the moderate-intensity exercise.  Maintain a healthy  weight  Body mass index (BMI) is a measurement that can be used to identify possible weight problems. It estimates body fat based on height and weight. Your health care provider can help determine your BMI and help you achieve or maintain a healthy weight.  For females 19 years of age and older:   A BMI below 18.5 is considered underweight.  A BMI of 18.5 to 24.9 is normal.  A BMI of 25 to 29.9 is considered overweight.  A BMI of 30 and above is considered obese.  Watch levels of cholesterol and blood lipids  You should start having your blood tested for lipids and cholesterol at 53 years of age, then have this test every 5 years.  You may need to have your cholesterol levels checked more often if:  Your lipid or cholesterol levels are high.  You are older than 53 years of age.  You are at high risk for heart disease.  CANCER SCREENING   Lung Cancer  Lung cancer screening is recommended for adults 91-23 years old who are at high risk for lung cancer because of a history of smoking.  A yearly low-dose CT scan of the lungs is recommended for people who:  Currently smoke.  Have quit within the past 15 years.  Have at least a 30-pack-year history of smoking. A pack year is smoking an average of one pack of cigarettes a day for 1 year.  Yearly screening should continue until it has been 15 years since you quit.  Yearly screening should stop if you develop a health problem that  would prevent you from having lung cancer treatment.  Breast Cancer  Practice breast self-awareness. This means understanding how your breasts normally appear and feel.  It also means doing regular breast self-exams. Let your health care provider know about any changes, no matter how small.  If you are in your 20s or 30s, you should have a clinical breast exam (CBE) by a health care provider every 1-3 years as part of a regular health exam.  If you are 40 or older, have a CBE every year. Also  consider having a breast X-ray (mammogram) every year.  If you have a family history of breast cancer, talk to your health care provider about genetic screening.  If you are at high risk for breast cancer, talk to your health care provider about having an MRI and a mammogram every year.  Breast cancer gene (BRCA) assessment is recommended for women who have family members with BRCA-related cancers. BRCA-related cancers include:  Breast.  Ovarian.  Tubal.  Peritoneal cancers.  Results of the assessment will determine the need for genetic counseling and BRCA1 and BRCA2 testing. Cervical Cancer Routine pelvic examinations to screen for cervical cancer are no longer recommended for nonpregnant women who are considered low risk for cancer of the pelvic organs (ovaries, uterus, and vagina) and who do not have symptoms. A pelvic examination may be necessary if you have symptoms including those associated with pelvic infections. Ask your health care provider if a screening pelvic exam is right for you.   The Pap test is the screening test for cervical cancer for women who are considered at risk.  If you had a hysterectomy for a problem that was not cancer or a condition that could lead to cancer, then you no longer need Pap tests.  If you are older than 65 years, and you have had normal Pap tests for the past 10 years, you no longer need to have Pap tests.  If you have had past treatment for cervical cancer or a condition that could lead to cancer, you need Pap tests and screening for cancer for at least 20 years after your treatment.  If you no longer get a Pap test, assess your risk factors if they change (such as having a new sexual partner). This can affect whether you should start being screened again.  Some women have medical problems that increase their chance of getting cervical cancer. If this is the case for you, your health care provider may recommend more frequent screening and Pap  tests.  The human papillomavirus (HPV) test is another test that may be used for cervical cancer screening. The HPV test looks for the virus that can cause cell changes in the cervix. The cells collected during the Pap test can be tested for HPV.  The HPV test can be used to screen women 78 years of age and older. Getting tested for HPV can extend the interval between normal Pap tests from three to five years.  An HPV test also should be used to screen women of any age who have unclear Pap test results.  After 53 years of age, women should have HPV testing as often as Pap tests.  Colorectal Cancer  This type of cancer can be detected and often prevented.  Routine colorectal cancer screening usually begins at 52 years of age and continues through 53 years of age.  Your health care provider may recommend screening at an earlier age if you have risk factors for colon cancer.  Your health care provider may also recommend using home test kits to check for hidden blood in the stool.  A small camera at the end of a tube can be used to examine your colon directly (sigmoidoscopy or colonoscopy). This is done to check for the earliest forms of colorectal cancer.  Routine screening usually begins at age 10.  Direct examination of the colon should be repeated every 5-10 years through 53 years of age. However, you may need to be screened more often if early forms of precancerous polyps or small growths are found. Skin Cancer  Check your skin from head to toe regularly.  Tell your health care provider about any new moles or changes in moles, especially if there is a change in a mole's shape or color.  Also tell your health care provider if you have a mole that is larger than the size of a pencil eraser.  Always use sunscreen. Apply sunscreen liberally and repeatedly throughout the day.  Protect yourself by wearing long sleeves, pants, a wide-brimmed hat, and sunglasses whenever you are  outside. HEART DISEASE, DIABETES, AND HIGH BLOOD PRESSURE   Have your blood pressure checked at least every 1-2 years. High blood pressure causes heart disease and increases the risk of stroke.  If you are between 36 years and 37 years old, ask your health care provider if you should take aspirin to prevent strokes.  Have regular diabetes screenings. This involves taking a blood sample to check your fasting blood sugar level.  If you are at a normal weight and have a low risk for diabetes, have this test once every three years after 53 years of age.  If you are overweight and have a high risk for diabetes, consider being tested at a younger age or more often. PREVENTING INFECTION  Hepatitis B  If you have a higher risk for hepatitis B, you should be screened for this virus. You are considered at high risk for hepatitis B if:  You were born in a country where hepatitis B is common. Ask your health care provider which countries are considered high risk.  Your parents were born in a high-risk country, and you have not been immunized against hepatitis B (hepatitis B vaccine).  You have HIV or AIDS.  You use needles to inject street drugs.  You live with someone who has hepatitis B.  You have had sex with someone who has hepatitis B.  You get hemodialysis treatment.  You take certain medicines for conditions, including cancer, organ transplantation, and autoimmune conditions. Hepatitis C  Blood testing is recommended for:  Everyone born from [birth year] through 1965.  Anyone with known risk factors for hepatitis C. Sexually transmitted infections (STIs)  You should be screened for sexually transmitted infections (STIs) including gonorrhea and chlamydia if:  You are sexually active and are younger than 53 years of age.  You are older than 53 years of age and your health care provider tells you that you are at risk for this type of infection.  Your sexual activity has changed since  you were last screened and you are at an increased risk for chlamydia or gonorrhea. Ask your health care provider if you are at risk.  If you do not have HIV, but are at risk, it may be recommended that you take a prescription medicine daily to prevent HIV infection. This is called pre-exposure prophylaxis (PrEP). You are considered at risk if:  You are sexually active and do not regularly use  condoms or know the HIV status of your partner(s).  You take drugs by injection.  You are sexually active with a partner who has HIV. Talk with your health care provider about whether you are at high risk of being infected with HIV. If you choose to begin PrEP, you should first be tested for HIV. You should then be tested every 3 months for as long as you are taking PrEP.  PREGNANCY   If you are premenopausal and you may become pregnant, ask your health care provider about preconception counseling.  If you may become pregnant, take 400 to 800 micrograms (mcg) of folic acid every day.  If you want to prevent pregnancy, talk to your health care provider about birth control (contraception). OSTEOPOROSIS AND MENOPAUSE   Osteoporosis is a disease in which the bones lose minerals and strength with aging. This can result in serious bone fractures. Your risk for osteoporosis can be identified using a bone density scan.  If you are 73 years of age or older, or if you are at risk for osteoporosis and fractures, ask your health care provider if you should be screened.  Ask your health care provider whether you should take a calcium or vitamin D supplement to lower your risk for osteoporosis.  Menopause may have certain physical symptoms and risks.  Hormone replacement therapy may reduce some of these symptoms and risks. Talk to your health care provider about whether hormone replacement therapy is right for you.  HOME CARE INSTRUCTIONS   Schedule regular health, dental, and eye exams.  Stay current with  your immunizations.   Do not use any tobacco products including cigarettes, chewing tobacco, or electronic cigarettes.  If you are pregnant, do not drink alcohol.  If you are breastfeeding, limit how much and how often you drink alcohol.  Limit alcohol intake to no more than 1 drink per day for nonpregnant women. One drink equals 12 ounces of beer, 5 ounces of wine, or 1 ounces of hard liquor.  Do not use street drugs.  Do not share needles.  Ask your health care provider for help if you need support or information about quitting drugs.  Tell your health care provider if you often feel depressed.  Tell your health care provider if you have ever been abused or do not feel safe at home. Document Released: 12/31/2010 Document Revised: 11/01/2013 Document Reviewed: 05/19/2013 [facility] Patient Information 2015 [place], [place]. This information is not intended to replace advice given to you by your health care provider. Make sure you discuss any questions you have with your health care provider.

## 2014-05-18 ENCOUNTER — Ambulatory Visit: Payer: Federal, State, Local not specified - PPO | Admitting: Internal Medicine

## 2014-10-28 ENCOUNTER — Other Ambulatory Visit (INDEPENDENT_AMBULATORY_CARE_PROVIDER_SITE_OTHER): Payer: Federal, State, Local not specified - PPO

## 2014-10-28 DIAGNOSIS — Z0000 Encounter for general adult medical examination without abnormal findings: Secondary | ICD-10-CM | POA: Diagnosis not present

## 2014-10-28 LAB — HEPATIC FUNCTION PANEL
ALBUMIN: 4.3 g/dL (ref 3.5–5.2)
ALT: 12 U/L (ref 0–35)
AST: 16 U/L (ref 0–37)
Alkaline Phosphatase: 65 U/L (ref 39–117)
BILIRUBIN DIRECT: 0.2 mg/dL (ref 0.0–0.3)
TOTAL PROTEIN: 7.2 g/dL (ref 6.0–8.3)
Total Bilirubin: 0.9 mg/dL (ref 0.2–1.2)

## 2014-10-28 LAB — URINALYSIS, ROUTINE W REFLEX MICROSCOPIC
Bilirubin Urine: NEGATIVE
HGB URINE DIPSTICK: NEGATIVE
KETONES UR: NEGATIVE
Leukocytes, UA: NEGATIVE
NITRITE: NEGATIVE
RBC / HPF: NONE SEEN (ref 0–?)
Specific Gravity, Urine: 1.02 (ref 1.000–1.030)
Total Protein, Urine: NEGATIVE
Urine Glucose: NEGATIVE
Urobilinogen, UA: 0.2 (ref 0.0–1.0)
pH: 6 (ref 5.0–8.0)

## 2014-10-28 LAB — LIPID PANEL
CHOL/HDL RATIO: 3
CHOLESTEROL: 210 mg/dL — AB (ref 0–200)
HDL: 74.7 mg/dL (ref >39.00)
LDL Cholesterol: 124 mg/dL — ABNORMAL HIGH (ref 0–99)
NonHDL: 135.3
Triglycerides: 57 mg/dL (ref 0.0–149.0)
VLDL: 11.4 mg/dL (ref 0.0–40.0)

## 2014-10-28 LAB — BASIC METABOLIC PANEL
BUN: 18 mg/dL (ref 6–23)
CALCIUM: 9.7 mg/dL (ref 8.4–10.5)
CHLORIDE: 103 meq/L (ref 96–112)
CO2: 28 mEq/L (ref 19–32)
CREATININE: 0.82 mg/dL (ref 0.40–1.20)
GFR: 77.4 mL/min (ref >60.00)
Glucose, Bld: 85 mg/dL (ref 70–99)
Potassium: 4 mEq/L (ref 3.5–5.1)
SODIUM: 139 meq/L (ref 135–145)

## 2014-10-28 LAB — CBC WITH DIFFERENTIAL/PLATELET
Basophils Absolute: 0 10*3/uL (ref 0.0–0.1)
Basophils Relative: 0.5 % (ref 0.0–3.0)
EOS PCT: 3.5 % (ref 0.0–5.0)
Eosinophils Absolute: 0.1 10*3/uL (ref 0.0–0.7)
HEMATOCRIT: 39.5 % (ref 36.0–46.0)
Hemoglobin: 13.4 g/dL (ref 12.0–15.0)
LYMPHS ABS: 1.4 10*3/uL (ref 0.7–4.0)
LYMPHS PCT: 34.2 % (ref 12.0–46.0)
MCHC: 34 g/dL (ref 30.0–36.0)
MCV: 88.1 fl (ref 78.0–100.0)
Monocytes Absolute: 0.3 10*3/uL (ref 0.1–1.0)
Monocytes Relative: 8 % (ref 3.0–12.0)
Neutro Abs: 2.3 10*3/uL (ref 1.4–7.7)
Neutrophils Relative %: 53.8 % (ref 43.0–77.0)
Platelets: 207 10*3/uL (ref 150.0–400.0)
RBC: 4.48 Mil/uL (ref 3.87–5.11)
RDW: 13.4 % (ref 11.5–15.5)
WBC: 4.2 10*3/uL (ref 4.0–10.5)

## 2014-10-28 LAB — TSH: TSH: 2.8 u[IU]/mL (ref 0.35–4.50)

## 2014-11-13 ENCOUNTER — Other Ambulatory Visit: Payer: Self-pay | Admitting: Internal Medicine

## 2014-11-17 ENCOUNTER — Telehealth: Payer: Self-pay | Admitting: Internal Medicine

## 2014-11-17 MED ORDER — SERTRALINE HCL 25 MG PO TABS
25.0000 mg | ORAL_TABLET | Freq: Every day | ORAL | Status: DC
Start: 2014-11-17 — End: 2016-11-29

## 2014-11-17 MED ORDER — SERTRALINE HCL 100 MG PO TABS: 100.0000 mg | ORAL_TABLET | Freq: Every day | ORAL | Status: DC

## 2014-11-17 MED ORDER — OMEPRAZOLE 20 MG PO CPDR: 20.0000 mg | DELAYED_RELEASE_CAPSULE | Freq: Every day | ORAL | Status: DC

## 2014-11-17 NOTE — Telephone Encounter (Signed)
Patient is requesting refill on zoloft 25mg .  [facility] uses [facility] on [address].

## 2014-12-02 ENCOUNTER — Other Ambulatory Visit: Payer: Self-pay

## 2015-03-16 ENCOUNTER — Encounter: Payer: Self-pay | Admitting: Internal Medicine

## 2015-03-16 ENCOUNTER — Other Ambulatory Visit (INDEPENDENT_AMBULATORY_CARE_PROVIDER_SITE_OTHER): Payer: Federal, State, Local not specified - PPO

## 2015-03-16 ENCOUNTER — Ambulatory Visit (INDEPENDENT_AMBULATORY_CARE_PROVIDER_SITE_OTHER): Payer: Federal, State, Local not specified - PPO | Admitting: Internal Medicine

## 2015-03-16 VITALS — BP 102/68 | HR 77 | Temp 98.4°F | Resp 16 | Ht 68.0 in | Wt 145.6 lb

## 2015-03-16 DIAGNOSIS — Z0000 Encounter for general adult medical examination without abnormal findings: Secondary | ICD-10-CM | POA: Diagnosis not present

## 2015-03-16 DIAGNOSIS — Z23 Encounter for immunization: Secondary | ICD-10-CM

## 2015-03-16 LAB — LIPID PANEL
CHOL/HDL RATIO: 3
CHOLESTEROL: 176 mg/dL (ref 0–200)
HDL: 56 mg/dL (ref >39.00)
LDL CALC: 99 mg/dL (ref 0–99)
NonHDL: 119.51
Triglycerides: 103 mg/dL (ref 0.0–149.0)
VLDL: 20.6 mg/dL (ref 0.0–40.0)

## 2015-03-16 LAB — COMPREHENSIVE METABOLIC PANEL
ALK PHOS: 93 U/L (ref 39–117)
ALT: 58 U/L — ABNORMAL HIGH (ref 0–35)
AST: 50 U/L — ABNORMAL HIGH (ref 0–37)
Albumin: 4.3 g/dL (ref 3.5–5.2)
BUN: 13 mg/dL (ref 6–23)
CHLORIDE: 101 meq/L (ref 96–112)
CO2: 29 meq/L (ref 19–32)
Calcium: 9.5 mg/dL (ref 8.4–10.5)
Creatinine, Ser: 0.79 mg/dL (ref 0.40–1.20)
GFR: 80.68 mL/min (ref >60.00)
GLUCOSE: 95 mg/dL (ref 70–99)
POTASSIUM: 4 meq/L (ref 3.5–5.1)
SODIUM: 138 meq/L (ref 135–145)
Total Bilirubin: 0.7 mg/dL (ref 0.2–1.2)
Total Protein: 7.5 g/dL (ref 6.0–8.3)

## 2015-03-16 LAB — T4, FREE: Free T4: 0.85 ng/dL (ref 0.60–1.60)

## 2015-03-16 LAB — FERRITIN: FERRITIN: 92.6 ng/mL (ref 10.0–291.0)

## 2015-03-16 LAB — VITAMIN B12

## 2015-03-16 LAB — TSH: TSH: 1.01 u[IU]/mL (ref 0.35–4.50)

## 2015-03-16 NOTE — Progress Notes (Signed)
   Subjective:    Patient ID: Renee [name], female    DOB: 15-Nov-1960, 54 y.o.   MRN: [id number]  HPI The patient is a 54 YO female coming in for wellness. No new concerns.   PMH, [facility], social history reviewed and updated.   Review of Systems  Constitutional: Negative for fever, activity change, appetite change, fatigue and unexpected weight change.  HENT: Negative.   Eyes: Negative.   Respiratory: Negative for cough, chest tightness, shortness of breath and wheezing.   Cardiovascular: Positive for palpitations. Negative for chest pain and leg swelling.  Musculoskeletal: Negative for myalgias, back pain and arthralgias.  Skin: Negative.   Neurological: Negative.   Psychiatric/Behavioral: Negative.       Objective:   Physical Exam  Constitutional: She is oriented to person, place, and time. She appears well-developed and well-nourished.  HENT:  Head: Normocephalic and atraumatic.  Eyes: EOM are normal.  Neck: Normal range of motion. No thyromegaly present.  Cardiovascular: Normal rate and regular rhythm.   Pulmonary/Chest: Effort normal and breath sounds normal. No respiratory distress. She has no wheezes. She has no rales.  Abdominal: Soft. Bowel sounds are normal. She exhibits no distension. There is no tenderness. There is no rebound.  Musculoskeletal: She exhibits no edema.  Neurological: She is alert and oriented to person, place, and time. Coordination normal.  Skin: Skin is warm and dry.  Psychiatric: She has a normal mood and affect.   Filed Vitals:   03/16/15 1342  BP: 102/68  Pulse: 77  Temp: 98.4 F (36.9 C)  TempSrc: Oral  Resp: 16  Height: 5\' 8"  (1.727 m)  Weight: 145 lb 9.6 oz (66.044 kg)  SpO2: 98%   EKG: RBBB, prior from 2013 to compare, regular rhythm, right axis mild, rate 58, no significant change from prior    Assessment & Plan:  Flu shot given at visit.

## 2015-03-16 NOTE — Assessment & Plan Note (Signed)
Stable and is gradually decreasing to 100 mg daily zoloft without problems.

## 2015-03-16 NOTE — Patient Instructions (Signed)
We have checked the EKG of the heart which is not different from several years ago. If you continue to have the skipping beats we can consider sending you back to cardiology for a monitor to watch when you are having the symptoms.   We are checking lab tests today for the thyroid and the iron levels to see if these are affecting your energy.   Health Maintenance Adopting a healthy lifestyle and getting preventive care can go a long way to promote health and wellness. Talk with your health care provider about what schedule of regular examinations is right for you. This is a good chance for you to check in with your provider about disease prevention and staying healthy. In between checkups, there are plenty of things you can do on your own. Experts have done a lot of research about which lifestyle changes and preventive measures are most likely to keep you healthy. Ask your health care provider for more information. WEIGHT AND DIET  Eat a healthy diet  Be sure to include plenty of vegetables, fruits, low-fat dairy products, and lean protein.  Do not eat a lot of foods high in solid fats, added sugars, or salt.  Get regular exercise. This is one of the most important things you can do for your health.  Most adults should exercise for at least 150 minutes each week. The exercise should increase your heart rate and make you sweat (moderate-intensity exercise).  Most adults should also do strengthening exercises at least twice a week. This is in addition to the moderate-intensity exercise.  Maintain a healthy weight  Body mass index (BMI) is a measurement that can be used to identify possible weight problems. It estimates body fat based on height and weight. Your health care provider can help determine your BMI and help you achieve or maintain a healthy weight.  For females 67 years of age and older:   A BMI below 18.5 is considered underweight.  A BMI of 18.5 to 24.9 is normal.  A BMI of 25  to 29.9 is considered overweight.  A BMI of 30 and above is considered obese.  Watch levels of cholesterol and blood lipids  You should start having your blood tested for lipids and cholesterol at 54 years of age, then have this test every 5 years.  You may need to have your cholesterol levels checked more often if:  Your lipid or cholesterol levels are high.  You are older than 54 years of age.  You are at high risk for heart disease.  CANCER SCREENING   Lung Cancer  Lung cancer screening is recommended for adults 42-10 years old who are at high risk for lung cancer because of a history of smoking.  A yearly low-dose CT scan of the lungs is recommended for people who:  Currently smoke.  Have quit within the past 15 years.  Have at least a 30-pack-year history of smoking. A pack year is smoking an average of one pack of cigarettes a day for 1 year.  Yearly screening should continue until it has been 15 years since you quit.  Yearly screening should stop if you develop a health problem that would prevent you from having lung cancer treatment.  Breast Cancer  Practice breast self-awareness. This means understanding how your breasts normally appear and feel.  It also means doing regular breast self-exams. Let your health care provider know about any changes, no matter how small.  If you are in your 75s or  30s, you should have a clinical breast exam (CBE) by a health care provider every 1-3 years as part of a regular health exam.  If you are 40 or older, have a CBE every year. Also consider having a breast X-ray (mammogram) every year.  If you have a family history of breast cancer, talk to your health care provider about genetic screening.  If you are at high risk for breast cancer, talk to your health care provider about having an MRI and a mammogram every year.  Breast cancer gene (BRCA) assessment is recommended for women who have family members with BRCA-related  cancers. BRCA-related cancers include:  Breast.  Ovarian.  Tubal.  Peritoneal cancers.  Results of the assessment will determine the need for genetic counseling and BRCA1 and BRCA2 testing. Cervical Cancer Routine pelvic examinations to screen for cervical cancer are no longer recommended for nonpregnant women who are considered low risk for cancer of the pelvic organs (ovaries, uterus, and vagina) and who do not have symptoms. A pelvic examination may be necessary if you have symptoms including those associated with pelvic infections. Ask your health care provider if a screening pelvic exam is right for you.   The Pap test is the screening test for cervical cancer for women who are considered at risk.  If you had a hysterectomy for a problem that was not cancer or a condition that could lead to cancer, then you no longer need Pap tests.  If you are older than 65 years, and you have had normal Pap tests for the past 10 years, you no longer need to have Pap tests.  If you have had past treatment for cervical cancer or a condition that could lead to cancer, you need Pap tests and screening for cancer for at least 20 years after your treatment.  If you no longer get a Pap test, assess your risk factors if they change (such as having a new sexual partner). This can affect whether you should start being screened again.  Some women have medical problems that increase their chance of getting cervical cancer. If this is the case for you, your health care provider may recommend more frequent screening and Pap tests.  The human papillomavirus (HPV) test is another test that may be used for cervical cancer screening. The HPV test looks for the virus that can cause cell changes in the cervix. The cells collected during the Pap test can be tested for HPV.  The HPV test can be used to screen women 53 years of age and older. Getting tested for HPV can extend the interval between normal Pap tests from  three to five years.  An HPV test also should be used to screen women of any age who have unclear Pap test results.  After 54 years of age, women should have HPV testing as often as Pap tests.  Colorectal Cancer  This type of cancer can be detected and often prevented.  Routine colorectal cancer screening usually begins at 54 years of age and continues through 54 years of age.  Your health care provider may recommend screening at an earlier age if you have risk factors for colon cancer.  Your health care provider may also recommend using home test kits to check for hidden blood in the stool.  A small camera at the end of a tube can be used to examine your colon directly (sigmoidoscopy or colonoscopy). This is done to check for the earliest forms of colorectal cancer.  Routine screening usually begins at age 38.  Direct examination of the colon should be repeated every 5-10 years through 54 years of age. However, you may need to be screened more often if early forms of precancerous polyps or small growths are found. Skin Cancer  Check your skin from head to toe regularly.  Tell your health care provider about any new moles or changes in moles, especially if there is a change in a mole's shape or color.  Also tell your health care provider if you have a mole that is larger than the size of a pencil eraser.  Always use sunscreen. Apply sunscreen liberally and repeatedly throughout the day.  Protect yourself by wearing long sleeves, pants, a wide-brimmed hat, and sunglasses whenever you are outside. HEART DISEASE, DIABETES, AND HIGH BLOOD PRESSURE   Have your blood pressure checked at least every 1-2 years. High blood pressure causes heart disease and increases the risk of stroke.  If you are between 34 years and 82 years old, ask your health care provider if you should take aspirin to prevent strokes.  Have regular diabetes screenings. This involves taking a blood sample to check  your fasting blood sugar level.  If you are at a normal weight and have a low risk for diabetes, have this test once every three years after 54 years of age.  If you are overweight and have a high risk for diabetes, consider being tested at a younger age or more often. PREVENTING INFECTION  Hepatitis B  If you have a higher risk for hepatitis B, you should be screened for this virus. You are considered at high risk for hepatitis B if:  You were born in a country where hepatitis B is common. Ask your health care provider which countries are considered high risk.  Your parents were born in a high-risk country, and you have not been immunized against hepatitis B (hepatitis B vaccine).  You have HIV or AIDS.  You use needles to inject street drugs.  You live with someone who has hepatitis B.  You have had sex with someone who has hepatitis B.  You get hemodialysis treatment.  You take certain medicines for conditions, including cancer, organ transplantation, and autoimmune conditions. Hepatitis C  Blood testing is recommended for:  Everyone born from [birth year] through 1965.  Anyone with known risk factors for hepatitis C. Sexually transmitted infections (STIs)  You should be screened for sexually transmitted infections (STIs) including gonorrhea and chlamydia if:  You are sexually active and are younger than 54 years of age.  You are older than 54 years of age and your health care provider tells you that you are at risk for this type of infection.  Your sexual activity has changed since you were last screened and you are at an increased risk for chlamydia or gonorrhea. Ask your health care provider if you are at risk.  If you do not have HIV, but are at risk, it may be recommended that you take a prescription medicine daily to prevent HIV infection. This is called pre-exposure prophylaxis (PrEP). You are considered at risk if:  You are sexually active and do not regularly use  condoms or know the HIV status of your partner(s).  You take drugs by injection.  You are sexually active with a partner who has HIV. Talk with your health care provider about whether you are at high risk of being infected with HIV. If you choose to begin PrEP, you should first  be tested for HIV. You should then be tested every 3 months for as long as you are taking PrEP.  PREGNANCY   If you are premenopausal and you may become pregnant, ask your health care provider about preconception counseling.  If you may become pregnant, take 400 to 800 micrograms (mcg) of folic acid every day.  If you want to prevent pregnancy, talk to your health care provider about birth control (contraception). OSTEOPOROSIS AND MENOPAUSE   Osteoporosis is a disease in which the bones lose minerals and strength with aging. This can result in serious bone fractures. Your risk for osteoporosis can be identified using a bone density scan.  If you are 74 years of age or older, or if you are at risk for osteoporosis and fractures, ask your health care provider if you should be screened.  Ask your health care provider whether you should take a calcium or vitamin D supplement to lower your risk for osteoporosis.  Menopause may have certain physical symptoms and risks.  Hormone replacement therapy may reduce some of these symptoms and risks. Talk to your health care provider about whether hormone replacement therapy is right for you.  HOME CARE INSTRUCTIONS   Schedule regular health, dental, and eye exams.  Stay current with your immunizations.   Do not use any tobacco products including cigarettes, chewing tobacco, or electronic cigarettes.  If you are pregnant, do not drink alcohol.  If you are breastfeeding, limit how much and how often you drink alcohol.  Limit alcohol intake to no more than 1 drink per day for nonpregnant women. One drink equals 12 ounces of beer, 5 ounces of wine, or 1 ounces of hard  liquor.  Do not use street drugs.  Do not share needles.  Ask your health care provider for help if you need support or information about quitting drugs.  Tell your health care provider if you often feel depressed.  Tell your health care provider if you have ever been abused or do not feel safe at home. Document Released: 12/31/2010 Document Revised: 11/01/2013 Document Reviewed: 05/19/2013 [facility] Patient Information 2015 [place], [place]. This information is not intended to replace advice given to you by your health care provider. Make sure you discuss any questions you have with your health care provider.

## 2015-03-16 NOTE — Progress Notes (Signed)
Pre visit review using our clinic review tool, if applicable. No additional management support is needed unless otherwise documented below in the visit note. 

## 2015-03-16 NOTE — Assessment & Plan Note (Signed)
Checking lipid panel, not currently on any medications. Adjust as needed.

## 2015-03-16 NOTE — Assessment & Plan Note (Signed)
EKG without change from prior (interpretted at visit). Checking labs today and adjust as needed. Flu shot given at visit. Colonoscopy and mammogram up to date. Talked to her about sun safety for skin cancer prevention.

## 2015-04-11 ENCOUNTER — Encounter: Payer: Federal, State, Local not specified - PPO | Admitting: Internal Medicine

## 2015-05-17 ENCOUNTER — Other Ambulatory Visit: Payer: Self-pay | Admitting: Internal Medicine

## 2015-07-01 ENCOUNTER — Emergency Department (HOSPITAL_COMMUNITY): Payer: Federal, State, Local not specified - PPO

## 2015-07-01 ENCOUNTER — Emergency Department (HOSPITAL_COMMUNITY)
Admission: EM | Admit: 2015-07-01 | Discharge: 2015-07-01 | Disposition: A | Discharge status: Home / Self Care | Payer: Federal, State, Local not specified - PPO | Attending: Emergency Medicine | Admitting: Emergency Medicine

## 2015-07-01 DIAGNOSIS — R0789 Other chest pain: Secondary | ICD-10-CM | POA: Diagnosis not present

## 2015-07-01 DIAGNOSIS — R079 Chest pain, unspecified: Secondary | ICD-10-CM | POA: Diagnosis present

## 2015-07-01 DIAGNOSIS — Z86010 Personal history of colonic polyps: Secondary | ICD-10-CM | POA: Diagnosis not present

## 2015-07-01 DIAGNOSIS — Z7951 Long term (current) use of inhaled steroids: Secondary | ICD-10-CM | POA: Diagnosis not present

## 2015-07-01 DIAGNOSIS — Z8639 Personal history of other endocrine, nutritional and metabolic disease: Secondary | ICD-10-CM | POA: Insufficient documentation

## 2015-07-01 DIAGNOSIS — M170 Bilateral primary osteoarthritis of knee: Secondary | ICD-10-CM | POA: Insufficient documentation

## 2015-07-01 DIAGNOSIS — Z7952 Long term (current) use of systemic steroids: Secondary | ICD-10-CM | POA: Insufficient documentation

## 2015-07-01 DIAGNOSIS — R110 Nausea: Secondary | ICD-10-CM | POA: Insufficient documentation

## 2015-07-01 DIAGNOSIS — F418 Other specified anxiety disorders: Secondary | ICD-10-CM | POA: Diagnosis not present

## 2015-07-01 DIAGNOSIS — Z79899 Other long term (current) drug therapy: Secondary | ICD-10-CM | POA: Insufficient documentation

## 2015-07-01 DIAGNOSIS — Z7982 Long term (current) use of aspirin: Secondary | ICD-10-CM | POA: Insufficient documentation

## 2015-07-01 DIAGNOSIS — Z8679 Personal history of other diseases of the circulatory system: Secondary | ICD-10-CM | POA: Diagnosis not present

## 2015-07-01 LAB — BASIC METABOLIC PANEL
Anion gap: 9 (ref 5–15)
BUN: 20 mg/dL (ref 6–20)
CHLORIDE: 103 mmol/L (ref 101–111)
CO2: 27 mmol/L (ref 22–32)
Calcium: 9.5 mg/dL (ref 8.9–10.3)
Creatinine, Ser: 0.85 mg/dL (ref 0.44–1.00)
GFR calc Af Amer: >60 mL/min (ref >60)
GFR calc non Af Amer: >60 mL/min (ref >60)
GLUCOSE: 109 mg/dL — AB (ref 65–99)
POTASSIUM: 4.4 mmol/L (ref 3.5–5.1)
Sodium: 139 mmol/L (ref 135–145)

## 2015-07-01 LAB — CBC
HCT: 39.9 % (ref 36.0–46.0)
Hemoglobin: 13.1 g/dL (ref 12.0–15.0)
MCH: 29.2 pg (ref 26.0–34.0)
MCHC: 32.8 g/dL (ref 30.0–36.0)
MCV: 89.1 fL (ref 78.0–100.0)
PLATELETS: 184 10*3/uL (ref 150–400)
RBC: 4.48 MIL/uL (ref 3.87–5.11)
RDW: 13.1 % (ref 11.5–15.5)
WBC: 4.5 10*3/uL (ref 4.0–10.5)

## 2015-07-01 LAB — I-STAT TROPONIN, ED: Troponin i, poc: 0 ng/mL (ref 0.00–0.08)

## 2015-07-01 MED ORDER — METHOCARBAMOL 500 MG PO TABS: 500.0000 mg | ORAL_TABLET | Freq: Two times a day (BID) | ORAL | Status: DC | PRN

## 2015-07-01 MED ORDER — NAPROXEN 500 MG PO TABS: 500.0000 mg | ORAL_TABLET | Freq: Two times a day (BID) | ORAL | Status: DC

## 2015-07-01 MED ORDER — METHOCARBAMOL 500 MG PO TABS
500.0000 mg | ORAL_TABLET | Freq: Once | ORAL | Status: AC
  Administered 2015-07-01: 500 mg via ORAL
  Filled 2015-07-01: qty 1

## 2015-07-01 MED ORDER — NAPROXEN 500 MG PO TABS
500.0000 mg | ORAL_TABLET | Freq: Once | ORAL | Status: AC
  Administered 2015-07-01: 500 mg via ORAL
  Filled 2015-07-01: qty 1

## 2015-07-01 NOTE — ED Notes (Signed)
Pt c/o dull pain to the left chest that started yesterday and now c/o of increasing pain to the left chest with nausea. Pt reports history of right bundle branch block and palpitations. Pt is alert and oriented x4, VS stable, and in no acute distress.

## 2015-07-01 NOTE — Discharge Instructions (Signed)

## 2015-07-01 NOTE — ED Provider Notes (Signed)
CSN: [id number]     Arrival date & time 07/01/15  [id number] History   First MD Initiated Contact with Patient 07/01/15 0407     Chief Complaint  Patient presents with  . Chest Pain  . Nausea     (Consider location/radiation/quality/duration/timing/severity/associated sxs/prior Treatment) HPI Comments: Patient is a 54 year old female with a history of migraine headaches, irritable bowel syndrome, MVP, and depression who presents to the emergency department for evaluation of chest pain. Patient states that her chest pain began yesterday morning after waking. She states that it has been intermittent throughout the day. She describes the pain as sharp, lasting for 15-20 seconds before resolving. She states that her pain was present hourly, but became more frequent this evening. Patient reports feeling as though the pain was gas related. She took 2 tablets of Gas-X on 2 occasions without improvement in her pain. She did report that she was belching sporadically yesterday, but this provided no relief. Patient began to feel some nausea this evening. She states that she was "feeling weird". Patient denies a hx of ACS, DM, HTN, HLD, or DVT/PE. No FHx of ACS. She reports having a normal stress test ~12 years ago as well as a normal heart catheterization in ~2006. No associated fever, SOB, lightheadedness, dizziness, LOC, leg swelling. No pain at present.  Patient is a 54 y.o. female presenting with chest pain. The history is provided by the patient. No language interpreter was used.  Chest Pain Associated symptoms: nausea   Associated symptoms: no abdominal pain, no dizziness, no fever, no shortness of breath and not vomiting     Past Medical History  Diagnosis Date  . Allergic rhinitis, cause unspecified   . Migraines     during teenage years  . Hx of colonic polyps 2008  . IBS (irritable bowel syndrome)   . Arthritis     both knees, post traumatic - neck  . Depression     with anxiety.  .  Infertility, female     failed 3 attempts at in vitro fertilization  . MVP (mitral valve prolapse)     last 2D echo approx '10  . Abnormal chest x-ray     CXR c/w COPD. Full PFTs '11 - normal  . Depression   . Osteopenia     DEXA 03/2012: -1.0  . Dyslipidemia    Past Surgical History  Procedure Laterality Date  . Appendectomy    . Mandible reconstruction    . Laparoscopic endometriosis fulguration      1996 and 2002  . Hysteroplasty repair of uterine anomaly      2003 - laproscopically; 2006 - laparotomy  . Cardiac catheterization  2006    normal study  . Carpal tunnel release  2011    right hand  . Trigger finger release  2011    R thimb and ring finger  . Trigger finger release  02/04/2012    Gramig, in office  . Nasal septum surgery  11/2012    [name]  . Bunionectomy with hammertoe reconstruction Left 07/06/13    [facility]   Family History  Problem Relation Age of Onset  . Arthritis Mother     DOB 30  . Cancer Mother     Thyroid cancer  . Hypertension Mother   . Atrial fibrillation Mother   . Melanoma Mother   . Breast cancer Mother   . Arthritis Father     DOB 1929  . Melanoma Father   . Colon polyps Neg Hx   .  Rectal cancer Neg Hx   . Stomach cancer Neg Hx    Social History  Substance Use Topics  . Smoking status: Never Smoker   . Smokeless tobacco: Never Used  . Alcohol Use: No   OB History    No data available      Review of Systems  Constitutional: Negative for fever.  Respiratory: Negative for shortness of breath.   Cardiovascular: Positive for chest pain. Negative for leg swelling.  Gastrointestinal: Positive for nausea. Negative for vomiting and abdominal pain.  Neurological: Negative for dizziness, syncope and light-headedness.  All other systems reviewed and are negative.   Allergies  Codeine and Pollen extract  Home Medications   Prior to Admission medications   Medication Sig Start Date End Date Taking? Authorizing Provider  aspirin  325 MG tablet Take 650 mg by mouth once.   Yes Historical Provider, MD  DIGESTIVE ENZYMES PO Take 1 tablet by mouth every other day.   Yes Historical Provider, MD  fluticasone (FLONASE) 50 MCG/ACT nasal spray Place into both nostrils daily.   Yes Historical Provider, MD  L-GLUTAMINE PO Take 1 tablet by mouth 2 (two) times a week.   Yes Historical Provider, MD  Multiple Vitamin (MULTIVITAMIN WITH MINERALS) TABS tablet Take 1 tablet by mouth daily.   Yes Historical Provider, MD  omeprazole (PRILOSEC) 20 MG capsule Take 1 capsule (20 mg total) by mouth daily. Patient taking differently: Take 20 mg by mouth daily as needed (for acid reflux).  05/17/15  Yes Rowe Clack, MD  prednisoLONE acetate (PRED FORTE) 1 % ophthalmic suspension Place 1 drop into the left eye daily. 05/15/15  Yes Historical Provider, MD  sertraline (ZOLOFT) 100 MG tablet Take 1 tablet (100 mg total) by mouth daily. With 25 mg tablet daily 11/17/14  Yes Rowe Clack, MD  simethicone (MYLICON) 80 MG chewable tablet Chew 80 mg by mouth every 6 (six) hours as needed for flatulence.   Yes Historical Provider, MD  sertraline (ZOLOFT) 25 MG tablet Take 1 tablet (25 mg total) by mouth daily. Patient not taking: Reported on 03/16/2015 11/17/14   Rowe Clack, MD   BP 122/76 mmHg  Pulse 61  Temp(Src) 97.9 F (36.6 C) (Oral)  Resp 16  Ht 5' 7.5" (1.715 m)  Wt 65.318 kg  BMI 22.21 kg/m2  SpO2 100%   Physical Exam  Constitutional: She is oriented to person, place, and time. She appears well-developed and well-nourished. No distress.  Nontoxic/nonseptic appearing  HENT:  Head: Normocephalic and atraumatic.  Eyes: Conjunctivae and EOM are normal. No scleral icterus.  Neck: Normal range of motion.  Cardiovascular: Normal rate, regular rhythm and intact distal pulses.   Pulmonary/Chest: Effort normal and breath sounds normal. No respiratory distress. She has no wheezes. She has no rales. She exhibits tenderness.    No  bony deformity or crepitus. Chest expansion symmetric. Lungs clear bilaterally.  Abdominal: Soft. She exhibits no distension. There is no tenderness. There is no rebound and no guarding.  Soft, nontender. No peritoneal signs.  Musculoskeletal: Normal range of motion.  Neurological: She is alert and oriented to person, place, and time. She exhibits normal muscle tone. Coordination normal.  Skin: Skin is warm and dry. No rash noted. She is not diaphoretic. No erythema. No pallor.  Psychiatric: She has a normal mood and affect. Her behavior is normal.  Nursing note and vitals reviewed.   ED Course  Procedures (including critical care time) Labs Review Labs Reviewed  BASIC  METABOLIC PANEL - Abnormal; Notable for the following:    Glucose, Bld 109 (*)    All other components within normal limits  CBC  I-STAT TROPOININ, ED    Imaging Review Dg Chest 2 View  07/01/2015  CLINICAL DATA:  Acute onset of left-sided chest pain. Initial encounter. EXAM: CHEST  2 VIEW COMPARISON:  None. FINDINGS: The lungs are hyperexpanded, with flattening of the hemidiaphragms, compatible with COPD. Mild scarring is noted at the lung apices. There is no evidence of focal opacification, pleural effusion or pneumothorax. The heart is normal in size; the mediastinal contour is within normal limits. No acute osseous abnormalities are seen. IMPRESSION: Findings of COPD.  Lungs otherwise grossly clear. Electronically Signed   By: Garald Balding M.D.   On: 07/01/2015 04:26   I have personally reviewed and evaluated these images and lab results as part of my medical decision-making.   EKG Interpretation None      MDM   Final diagnoses:  Chest wall pain    54 year old female presents to the emergency department for evaluation of intermittent sharp chest pain, lasting for a few seconds when present. Pain is reproducible on palpation in the emergency department. Lungs are clear. Patient has no risk factors for heart  disease. Her EKG is nonischemic. Troponin is negative. Patient has a heart score of 1 consistent with low risk of acute coronary event. She has no evidence of pleural effusion or pneumothorax. No focal consolidation to suggest pneumonia. No mediastinal widening to raise concern for dissection. Patient is at low risk for pulmonary embolus. She has no complaints of shortness of breath and has had no tachycardia, tachypnea, dyspnea, or hypoxia while in the emergency department.  Given reproducibility of symptoms, suspect musculoskeletal etiology. Patient states that she has been doing frequent lifting recently and has been more active as they have been hosting family at their house the past 3 weekends. Will discharge with NSAIDs and Robaxin. Patient advised to follow-up with her primary care doctor. Return precautions given at discharge. Patient agreeable to plan with no unaddressed concerns. Patient discharged in good condition; VSS.   Filed Vitals:   07/01/15 0342 07/01/15 0510  BP: 122/76 118/76  Pulse: 61 60  Temp: 97.9 F (36.6 C)   TempSrc: Oral   Resp: 16 18  Height: 5' 7.5" (1.715 m)   Weight: 65.318 kg   SpO2: 100% 99%     Antonietta Breach, PA-C 07/01/15 [id number]  April Palumbo, MD 07/01/15 [phone]

## 2015-11-09 DIAGNOSIS — H43393 Other vitreous opacities, bilateral: Secondary | ICD-10-CM | POA: Diagnosis not present

## 2015-11-09 DIAGNOSIS — H43813 Vitreous degeneration, bilateral: Secondary | ICD-10-CM | POA: Diagnosis not present

## 2015-11-09 DIAGNOSIS — L570 Actinic keratosis: Secondary | ICD-10-CM | POA: Diagnosis not present

## 2015-11-09 DIAGNOSIS — L820 Inflamed seborrheic keratosis: Secondary | ICD-10-CM | POA: Diagnosis not present

## 2015-11-10 ENCOUNTER — Other Ambulatory Visit: Payer: Self-pay | Admitting: Internal Medicine

## 2015-11-13 DIAGNOSIS — H1789 Other corneal scars and opacities: Secondary | ICD-10-CM | POA: Diagnosis not present

## 2015-11-20 DIAGNOSIS — H1789 Other corneal scars and opacities: Secondary | ICD-10-CM | POA: Diagnosis not present

## 2016-02-22 DIAGNOSIS — H25041 Posterior subcapsular polar age-related cataract, right eye: Secondary | ICD-10-CM | POA: Diagnosis not present

## 2016-02-22 DIAGNOSIS — H25811 Combined forms of age-related cataract, right eye: Secondary | ICD-10-CM | POA: Diagnosis not present

## 2016-02-22 DIAGNOSIS — H268 Other specified cataract: Secondary | ICD-10-CM | POA: Diagnosis not present

## 2016-03-18 ENCOUNTER — Encounter: Payer: Federal, State, Local not specified - PPO | Admitting: Internal Medicine

## 2016-03-22 ENCOUNTER — Encounter: Payer: Federal, State, Local not specified - PPO | Admitting: Internal Medicine

## 2016-04-03 ENCOUNTER — Ambulatory Visit (INDEPENDENT_AMBULATORY_CARE_PROVIDER_SITE_OTHER): Payer: Federal, State, Local not specified - PPO | Admitting: Internal Medicine

## 2016-04-03 ENCOUNTER — Encounter: Payer: Self-pay | Admitting: Internal Medicine

## 2016-04-03 ENCOUNTER — Other Ambulatory Visit (INDEPENDENT_AMBULATORY_CARE_PROVIDER_SITE_OTHER): Payer: Federal, State, Local not specified - PPO

## 2016-04-03 VITALS — BP 104/72 | HR 84 | Temp 98.4°F | Resp 16 | Ht 67.0 in | Wt 149.8 lb

## 2016-04-03 DIAGNOSIS — Z0000 Encounter for general adult medical examination without abnormal findings: Secondary | ICD-10-CM

## 2016-04-03 DIAGNOSIS — Z1159 Encounter for screening for other viral diseases: Secondary | ICD-10-CM | POA: Diagnosis not present

## 2016-04-03 LAB — COMPREHENSIVE METABOLIC PANEL
ALK PHOS: 65 U/L (ref 39–117)
ALT: 12 U/L (ref 0–35)
AST: 15 U/L (ref 0–37)
Albumin: 4.3 g/dL (ref 3.5–5.2)
BUN: 13 mg/dL (ref 6–23)
CO2: 30 mEq/L (ref 19–32)
Calcium: 9.3 mg/dL (ref 8.4–10.5)
Chloride: 102 mEq/L (ref 96–112)
Creatinine, Ser: 0.87 mg/dL (ref 0.40–1.20)
GFR: 71.9 mL/min (ref >60.00)
GLUCOSE: 88 mg/dL (ref 70–99)
POTASSIUM: 4.6 meq/L (ref 3.5–5.1)
Sodium: 140 mEq/L (ref 135–145)
TOTAL PROTEIN: 7.6 g/dL (ref 6.0–8.3)
Total Bilirubin: 0.7 mg/dL (ref 0.2–1.2)

## 2016-04-03 LAB — CBC
HEMATOCRIT: 39.4 % (ref 36.0–46.0)
HEMOGLOBIN: 13.6 g/dL (ref 12.0–15.0)
MCHC: 34.5 g/dL (ref 30.0–36.0)
MCV: 87.7 fl (ref 78.0–100.0)
Platelets: 195 10*3/uL (ref 150.0–400.0)
RBC: 4.49 Mil/uL (ref 3.87–5.11)
RDW: 13.2 % (ref 11.5–15.5)
WBC: 3.7 10*3/uL — ABNORMAL LOW (ref 4.0–10.5)

## 2016-04-03 LAB — LIPID PANEL
Cholesterol: 205 mg/dL — ABNORMAL HIGH (ref 0–200)
HDL: 75.1 mg/dL (ref >39.00)
LDL Cholesterol: 112 mg/dL — ABNORMAL HIGH (ref 0–99)
NONHDL: 130.14
TRIGLYCERIDES: 91 mg/dL (ref 0.0–149.0)
Total CHOL/HDL Ratio: 3
VLDL: 18.2 mg/dL (ref 0.0–40.0)

## 2016-04-03 LAB — TSH: TSH: 1.24 u[IU]/mL (ref 0.35–4.50)

## 2016-04-03 MED ORDER — OMEPRAZOLE 40 MG PO CPDR: 40.0000 mg | DELAYED_RELEASE_CAPSULE | Freq: Every day | ORAL | 3 refills | Status: DC

## 2016-04-03 NOTE — Progress Notes (Signed)
Pre visit review using our clinic review tool, if applicable. No additional management support is needed unless otherwise documented below in the visit note. 

## 2016-04-03 NOTE — Progress Notes (Signed)
   Subjective:    Patient ID: Renee [name], female    DOB: Aug 04, 1960, 55 y.o.   MRN: [id number]  HPI The patient is a 55 YO female coming in for wellness. No new concerns.   PMH, [facility], social history reviewed and updated.   Review of Systems  Constitutional: Negative.   HENT: Negative.   Eyes: Negative.   Respiratory: Negative.   Cardiovascular: Negative.   Gastrointestinal: Negative.   Musculoskeletal: Negative.   Skin: Negative.   Neurological: Negative.   Psychiatric/Behavioral: Negative.       Objective:   Physical Exam  Constitutional: She is oriented to person, place, and time. She appears well-developed and well-nourished.  HENT:  Head: Normocephalic and atraumatic.  Eyes: EOM are normal.  Neck: Normal range of motion.  Cardiovascular: Normal rate and regular rhythm.   Pulmonary/Chest: Effort normal and breath sounds normal. No respiratory distress. She has no wheezes. She has no rales.  Abdominal: Soft. Bowel sounds are normal. She exhibits no distension. There is no tenderness. There is no rebound.  Musculoskeletal: She exhibits no edema.  Neurological: She is alert and oriented to person, place, and time. Coordination normal.  Skin: Skin is warm and dry.  Psychiatric: She has a normal mood and affect.   Vitals:   04/03/16 1047  BP: 104/72  Pulse: 84  Resp: 16  Temp: 98.4 F (36.9 C)  TempSrc: Oral  SpO2: 98%  Weight: 149 lb 12.8 oz (67.9 kg)  Height: 5\' 7"  (1.702 m)      Assessment & Plan:

## 2016-04-03 NOTE — Patient Instructions (Signed)
We have sent in the omeprazole 40 mg pills to try for the reflux. It is okay to take 2 pills of what you have until they run out.   We are checking the labs today and will call you back about the results.    Trochanteric Bursitis You have hip pain due to trochanteric bursitis. Bursitis means that the sack near the outside of the hip is filled with fluid and inflamed. This sack is made up of protective soft tissue. The pain from trochanteric bursitis can be severe and keep you from sleep. It can radiate to the buttocks or down the outside of the thigh to the knee. The pain is almost always worse when rising from the seated or lying position and with walking. Pain can improve after you take a few steps. It happens more often in people with hip joint and lumbar spine problems, such as arthritis or previous surgery. Very rarely the trochanteric bursa can become infected, and antibiotics and/or surgery may be needed. Treatment often includes an injection of local anesthetic mixed with cortisone medicine. This medicine is injected into the area where it is most tender over the hip. Repeat injections may be necessary if the response to treatment is slow. You can apply ice packs over the tender area for 30 minutes every 2 hours for the next few days. Anti-inflammatory and/or narcotic pain medicine may also be helpful. Limit your activity for the next few days if the pain continues. See your caregiver in 5-10 days if you are not greatly improved.  SEEK IMMEDIATE MEDICAL CARE IF:  You develop severe pain, fever, or increased redness.  You have pain that radiates below the knee. EXERCISES STRETCHING EXERCISES - Trochanteric Bursitis  These exercises may help you when beginning to rehabilitate your injury. Your symptoms may resolve with or without further involvement from your physician, physical therapist, or athletic trainer. While completing these exercises, remember:   Restoring tissue flexibility helps  normal motion to return to the joints. This allows healthier, less painful movement and activity.  An effective stretch should be held for at least 30 seconds.  A stretch should never be painful. You should only feel a gentle lengthening or release in the stretched tissue. STRETCH - Iliotibial Band  On the floor or bed, lie on your side so your injured leg is on top. Bend your knee and grab your ankle.  Slowly bring your knee back so that your thigh is in line with your trunk. Keep your heel at your buttocks and gently arch your back so your head, shoulders and hips line up.  Slowly lower your leg so that your knee approaches the floor/bed until you feel a gentle stretch on the outside of your thigh. If you do not feel a stretch and your knee will not fall farther, place the heel of your opposite foot on top of your knee and pull your thigh down farther.  Hold this stretch for __________ seconds.  Repeat __________ times. Complete this exercise __________ times per day. STRETCH - Hamstrings, Supine   Lie on your back. Loop a belt or towel over the ball of your foot as shown.  Straighten your knee and slowly pull on the belt to raise your injured leg. Do not allow the knee to bend. Keep your opposite leg flat on the floor.  Raise the leg until you feel a gentle stretch behind your knee or thigh. Hold this position for __________ seconds.  Repeat __________ times. Complete this  stretch __________ times per day. STRETCH - Quadriceps, Prone   Lie on your stomach on a firm surface, such as a bed or padded floor.  Bend your knee and grasp your ankle. If you are unable to reach your ankle or pant leg, use a belt around your foot to lengthen your reach.  Gently pull your heel toward your buttocks. Your knee should not slide out to the side. You should feel a stretch in the front of your thigh and/or knee.  Hold this position for __________ seconds.  Repeat __________ times. Complete this  stretch __________ times per day. STRETCHING - Hip Flexors, Lunge Half kneel with your knee on the floor and your opposite knee bent and directly over your ankle.  Keep good posture with your head over your shoulders. Tighten your buttocks to point your tailbone downward; this will prevent your back from arching too much.  You should feel a gentle stretch in the front of your thigh and/or hip. If you do not feel any resistance, slightly slide your opposite foot forward and then slowly lunge forward so your knee once again lines up over your ankle. Be sure your tailbone remains pointed downward.  Hold this stretch for __________ seconds.  Repeat __________ times. Complete this stretch __________ times per day. STRETCH - Adductors, Lunge  While standing, spread your legs.  Lean away from your injured leg by bending your opposite knee. You may rest your hands on your thigh for balance.  You should feel a stretch in your inner thigh. Hold for __________ seconds.  Repeat __________ times. Complete this exercise __________ times per day.   This information is not intended to replace advice given to you by your health care provider. Make sure you discuss any questions you have with your health care provider.   Document Released: 07/25/2004 Document Revised: 11/01/2014 Document Reviewed: 09/29/2008 Elsevier Interactive Patient Education 2016 [place] Maintenance, Female Adopting a healthy lifestyle and getting preventive care can go a long way to promote health and wellness. Talk with your health care provider about what schedule of regular examinations is right for you. This is a good chance for you to check in with your provider about disease prevention and staying healthy. In between checkups, there are plenty of things you can do on your own. Experts have done a lot of research about which lifestyle changes and preventive measures are most likely to keep you healthy. Ask your  health care provider for more information. WEIGHT AND DIET  Eat a healthy diet  Be sure to include plenty of vegetables, fruits, low-fat dairy products, and lean protein.  Do not eat a lot of foods high in solid fats, added sugars, or salt.  Get regular exercise. This is one of the most important things you can do for your health.  Most adults should exercise for at least 150 minutes each week. The exercise should increase your heart rate and make you sweat (moderate-intensity exercise).  Most adults should also do strengthening exercises at least twice a week. This is in addition to the moderate-intensity exercise.  Maintain a healthy weight  Body mass index (BMI) is a measurement that can be used to identify possible weight problems. It estimates body fat based on height and weight. Your health care provider can help determine your BMI and help you achieve or maintain a healthy weight.  For females 1 years of age and older:   A BMI below 18.5 is considered underweight.  A BMI of 18.5 to 24.9 is normal.  A BMI of 25 to 29.9 is considered overweight.  A BMI of 30 and above is considered obese.  Watch levels of cholesterol and blood lipids  You should start having your blood tested for lipids and cholesterol at 55 years of age, then have this test every 5 years.  You may need to have your cholesterol levels checked more often if:  Your lipid or cholesterol levels are high.  You are older than 55 years of age.  You are at high risk for heart disease.  CANCER SCREENING   Lung Cancer  Lung cancer screening is recommended for adults 66-89 years old who are at high risk for lung cancer because of a history of smoking.  A yearly low-dose CT scan of the lungs is recommended for people who:  Currently smoke.  Have quit within the past 15 years.  Have at least a 30-pack-year history of smoking. A pack year is smoking an average of one pack of cigarettes a day for 1  year.  Yearly screening should continue until it has been 15 years since you quit.  Yearly screening should stop if you develop a health problem that would prevent you from having lung cancer treatment.  Breast Cancer  Practice breast self-awareness. This means understanding how your breasts normally appear and feel.  It also means doing regular breast self-exams. Let your health care provider know about any changes, no matter how small.  If you are in your 20s or 30s, you should have a clinical breast exam (CBE) by a health care provider every 1-3 years as part of a regular health exam.  If you are 69 or older, have a CBE every year. Also consider having a breast X-ray (mammogram) every year.  If you have a family history of breast cancer, talk to your health care provider about genetic screening.  If you are at high risk for breast cancer, talk to your health care provider about having an MRI and a mammogram every year.  Breast cancer gene (BRCA) assessment is recommended for women who have family members with BRCA-related cancers. BRCA-related cancers include:  Breast.  Ovarian.  Tubal.  Peritoneal cancers.  Results of the assessment will determine the need for genetic counseling and BRCA1 and BRCA2 testing. Cervical Cancer Your health care provider may recommend that you be screened regularly for cancer of the pelvic organs (ovaries, uterus, and vagina). This screening involves a pelvic examination, including checking for microscopic changes to the surface of your cervix (Pap test). You may be encouraged to have this screening done every 3 years, beginning at age 39.  For women ages 61-65, health care providers may recommend pelvic exams and Pap testing every 3 years, or they may recommend the Pap and pelvic exam, combined with testing for human papilloma virus (HPV), every 5 years. Some types of HPV increase your risk of cervical cancer. Testing for HPV may also be done on  women of any age with unclear Pap test results.  Other health care providers may not recommend any screening for nonpregnant women who are considered low risk for pelvic cancer and who do not have symptoms. Ask your health care provider if a screening pelvic exam is right for you.  If you have had past treatment for cervical cancer or a condition that could lead to cancer, you need Pap tests and screening for cancer for at least 20 years after your treatment. If Pap tests  have been discontinued, your risk factors (such as having a new sexual partner) need to be reassessed to determine if screening should resume. Some women have medical problems that increase the chance of getting cervical cancer. In these cases, your health care provider may recommend more frequent screening and Pap tests. Colorectal Cancer  This type of cancer can be detected and often prevented.  Routine colorectal cancer screening usually begins at 54 years of age and continues through 55 years of age.  Your health care provider may recommend screening at an earlier age if you have risk factors for colon cancer.  Your health care provider may also recommend using home test kits to check for hidden blood in the stool.  A small camera at the end of a tube can be used to examine your colon directly (sigmoidoscopy or colonoscopy). This is done to check for the earliest forms of colorectal cancer.  Routine screening usually begins at age 16.  Direct examination of the colon should be repeated every 5-10 years through 55 years of age. However, you may need to be screened more often if early forms of precancerous polyps or small growths are found. Skin Cancer  Check your skin from head to toe regularly.  Tell your health care provider about any new moles or changes in moles, especially if there is a change in a mole's shape or color.  Also tell your health care provider if you have a mole that is larger than the size of a  pencil eraser.  Always use sunscreen. Apply sunscreen liberally and repeatedly throughout the day.  Protect yourself by wearing long sleeves, pants, a wide-brimmed hat, and sunglasses whenever you are outside. HEART DISEASE, DIABETES, AND HIGH BLOOD PRESSURE   High blood pressure causes heart disease and increases the risk of stroke. High blood pressure is more likely to develop in:  People who have blood pressure in the high end of the normal range (130-139/85-89 mm Hg).  People who are overweight or obese.  People who are African American.  If you are 66-55 years of age, have your blood pressure checked every 3-5 years. If you are 35 years of age or older, have your blood pressure checked every year. You should have your blood pressure measured twice--once when you are at a hospital or clinic, and once when you are not at a hospital or clinic. Record the average of the two measurements. To check your blood pressure when you are not at a hospital or clinic, you can use:  An automated blood pressure machine at a pharmacy.  A home blood pressure monitor.  If you are between 76 years and 81 years old, ask your health care provider if you should take aspirin to prevent strokes.  Have regular diabetes screenings. This involves taking a blood sample to check your fasting blood sugar level.  If you are at a normal weight and have a low risk for diabetes, have this test once every three years after 55 years of age.  If you are overweight and have a high risk for diabetes, consider being tested at a younger age or more often. PREVENTING INFECTION  Hepatitis B  If you have a higher risk for hepatitis B, you should be screened for this virus. You are considered at high risk for hepatitis B if:  You were born in a country where hepatitis B is common. Ask your health care provider which countries are considered high risk.  Your parents were born in  a high-risk country, and you have not been  immunized against hepatitis B (hepatitis B vaccine).  You have HIV or AIDS.  You use needles to inject street drugs.  You live with someone who has hepatitis B.  You have had sex with someone who has hepatitis B.  You get hemodialysis treatment.  You take certain medicines for conditions, including cancer, organ transplantation, and autoimmune conditions. Hepatitis C  Blood testing is recommended for:  Everyone born from [birth year] through 1965.  Anyone with known risk factors for hepatitis C. Sexually transmitted infections (STIs)  You should be screened for sexually transmitted infections (STIs) including gonorrhea and chlamydia if:  You are sexually active and are younger than 55 years of age.  You are older than 55 years of age and your health care provider tells you that you are at risk for this type of infection.  Your sexual activity has changed since you were last screened and you are at an increased risk for chlamydia or gonorrhea. Ask your health care provider if you are at risk.  If you do not have HIV, but are at risk, it may be recommended that you take a prescription medicine daily to prevent HIV infection. This is called pre-exposure prophylaxis (PrEP). You are considered at risk if:  You are sexually active and do not regularly use condoms or know the HIV status of your partner(s).  You take drugs by injection.  You are sexually active with a partner who has HIV. Talk with your health care provider about whether you are at high risk of being infected with HIV. If you choose to begin PrEP, you should first be tested for HIV. You should then be tested every 3 months for as long as you are taking PrEP.  PREGNANCY   If you are premenopausal and you may become pregnant, ask your health care provider about preconception counseling.  If you may become pregnant, take 400 to 800 micrograms (mcg) of folic acid every day.  If you want to prevent pregnancy, talk to your  health care provider about birth control (contraception). OSTEOPOROSIS AND MENOPAUSE   Osteoporosis is a disease in which the bones lose minerals and strength with aging. This can result in serious bone fractures. Your risk for osteoporosis can be identified using a bone density scan.  If you are 7 years of age or older, or if you are at risk for osteoporosis and fractures, ask your health care provider if you should be screened.  Ask your health care provider whether you should take a calcium or vitamin D supplement to lower your risk for osteoporosis.  Menopause may have certain physical symptoms and risks.  Hormone replacement therapy may reduce some of these symptoms and risks. Talk to your health care provider about whether hormone replacement therapy is right for you.  HOME CARE INSTRUCTIONS   Schedule regular health, dental, and eye exams.  Stay current with your immunizations.   Do not use any tobacco products including cigarettes, chewing tobacco, or electronic cigarettes.  If you are pregnant, do not drink alcohol.  If you are breastfeeding, limit how much and how often you drink alcohol.  Limit alcohol intake to no more than 1 drink per day for nonpregnant women. One drink equals 12 ounces of beer, 5 ounces of wine, or 1 ounces of hard liquor.  Do not use street drugs.  Do not share needles.  Ask your health care provider for help if you need support or information  about quitting drugs.  Tell your health care provider if you often feel depressed.  Tell your health care provider if you have ever been abused or do not feel safe at home.   This information is not intended to replace advice given to you by your health care provider. Make sure you discuss any questions you have with your health care provider.   Document Released: 12/31/2010 Document Revised: 07/08/2014 Document Reviewed: 05/19/2013 Elsevier Interactive Patient Education [employer].

## 2016-04-04 LAB — HEPATITIS C ANTIBODY: HCV Ab: NEGATIVE

## 2016-04-04 NOTE — Assessment & Plan Note (Signed)
Colonoscopy up to date. Getting pap and mammogram at her gyn in several weeks. Checking hep c screening test today. Counseled on sun safety and mole surveillance. Given screening recommendations.

## 2016-04-22 ENCOUNTER — Encounter: Payer: Self-pay | Admitting: Gastroenterology

## 2016-04-22 ENCOUNTER — Ambulatory Visit (INDEPENDENT_AMBULATORY_CARE_PROVIDER_SITE_OTHER): Payer: Federal, State, Local not specified - PPO | Admitting: Gastroenterology

## 2016-04-22 VITALS — BP 120/70 | HR 80 | Ht 67.5 in | Wt 148.0 lb

## 2016-04-22 DIAGNOSIS — R195 Other fecal abnormalities: Secondary | ICD-10-CM | POA: Insufficient documentation

## 2016-04-22 DIAGNOSIS — K219 Gastro-esophageal reflux disease without esophagitis: Secondary | ICD-10-CM | POA: Diagnosis not present

## 2016-04-22 NOTE — Patient Instructions (Signed)
You have been scheduled for an endoscopy. Please follow written instructions given to you at your visit today. If you use inhalers (even only as needed), please bring them with you on the day of your procedure. Your physician has requested that you go to www.startemmi.com and enter the access code given to you at your visit today. This web site gives a general overview about your procedure. However, you should still follow specific instructions given to you by our office regarding your preparation for the procedure.  Continue omeprazole 40 mg daily.  Begin daily fiber supplement.

## 2016-04-22 NOTE — Progress Notes (Signed)
I agree with the above note, plan 

## 2016-04-22 NOTE — Progress Notes (Signed)
04/22/2016 Renee [name] [id number] 17-Mar-1961   HISTORY OF PRESENT ILLNESS:  This is a pleasant 55 year old female who is known to Dr. Ardis Hughs for colonoscopy in 03/2012 at which time her study was normal, but due to history of colon polyps on a previous colonoscopy she was placed in for a 5 year colonoscopy recall.  She presents to our office today with complaints of what she thinks is reflux.  She says that she's had issues with reflux on and off for probably 15 years but has tried to treat it naturally for the most part.  She says that a couple of years ago she was having some breathing issues and was seen by pulmonary with negative evaluation.  Then recently these breathing issues came on again and seemed to have ramped up over the past few months.  Says that she never really feels acid coming up but when she bends over she feels like something is affecting her breathing and is concerned that it is acid.  Concerned about "silent reflux".  Has been taking omeprazole 20 mg daily for a couple of months and has now been on 40 mg daily for a few weeks but still has not noted much improvement.  Denies CP, nausea, vomiting, epigastric pain, dysphagia, etc.    Also mentions that she used to always struggle with constipation.  Now has been having 1-3 soft-formed BM's daily so what just wondering what may have caused the change (although this is not affecting her negatively).  She is using Stevia and wondered if that can cause more frequent stooling, etc.  Does not see blood in stools.   Past Medical History:  Diagnosis Date  . Abnormal chest x-ray    CXR c/w COPD. Full PFTs '11 - normal  . Allergic rhinitis, cause unspecified   . Arthritis    both knees, post traumatic - neck  . Depression    with anxiety.  . Depression   . Dyslipidemia   . Hx of colonic polyps 2008  . IBS (irritable bowel syndrome)   . 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 Migraines    during teenage years  . MVP (mitral valve prolapse)    last 2D echo approx '10  . Osteopenia    DEXA 03/2012: -1.0   Past Surgical History:  Procedure Laterality Date  . APPENDECTOMY    . BUNIONECTOMY WITH HAMMERTOE RECONSTRUCTION Left 07/06/13   [facility]  . CARDIAC CATHETERIZATION  2006   normal study  . CARPAL TUNNEL RELEASE  2011   right hand  . HYSTEROPLASTY REPAIR OF UTERINE ANOMALY     2003 - laproscopically; 2006 - laparotomy  . LAPAROSCOPIC ENDOMETRIOSIS FULGURATION     1996 and 2002  . MANDIBLE RECONSTRUCTION    . NASAL SEPTUM SURGERY  11/2012   [name]  . TRIGGER FINGER RELEASE  2011   R thimb and ring finger  . TRIGGER FINGER RELEASE  02/04/2012   [facility], in office    reports that she has never smoked. She has never used smokeless tobacco. She reports that she drinks alcohol. She reports that she does not use drugs. family history includes Arthritis in her father and mother; Atrial fibrillation in her mother; Breast cancer in her mother; Cancer in her mother; Hypertension in her mother; Melanoma in her father and mother. Allergies  Allergen Reactions  . Codeine Other (See Comments)    Hot flashes/passed out  . Pollen  Extract Other (See Comments)    drainage      Outpatient Encounter Prescriptions as of 04/22/2016  Medication Sig  . DIGESTIVE ENZYMES PO Take 1 tablet by mouth every other day.  . L-GLUTAMINE PO Take 1 tablet by mouth 2 (two) times a week.  . Multiple Vitamin (MULTIVITAMIN WITH MINERALS) TABS tablet Take 1 tablet by mouth daily.  Marland Kitchen omeprazole (PRILOSEC) 40 MG capsule Take 1 capsule (40 mg total) by mouth daily.  . sertraline (ZOLOFT) 100 MG tablet TAKE 1 TABLET (100 MG TOTAL) BY MOUTH DAILY. WITH 25 MG TABLET DAILY  . sertraline (ZOLOFT) 25 MG tablet Take 1 tablet (25 mg total) by mouth daily.  . simethicone (MYLICON) 80 MG chewable tablet Chew 80 mg by mouth every 6 (six) hours as needed for flatulence.  . [DISCONTINUED] fluticasone (FLONASE) 50 MCG/ACT  nasal spray Place into both nostrils daily.  . [DISCONTINUED] methocarbamol (ROBAXIN) 500 MG tablet Take 1 tablet (500 mg total) by mouth 2 (two) times daily as needed for muscle spasms.   No facility-administered encounter medications on file as of 04/22/2016.      REVIEW OF SYSTEMS  : All other systems reviewed and negative except where noted in the History of Present Illness.   PHYSICAL EXAM: BP 120/70   Pulse 80   Ht 5' 7.5" (1.715 m)   Wt 148 lb (67.1 kg)   BMI 22.84 kg/m  General: Well developed white female in no acute distress Head: Normocephalic and atraumatic Eyes:  Sclerae anicteric, conjunctiva pink. Ears: Normal auditory acuity Lungs: Clear throughout to auscultation Heart: Regular rate and rhythm Abdomen: Soft, non-distended. Normal bowel sounds.  Non-tender. Musculoskeletal: Symmetrical with no gross deformities  Skin: No lesions on visible extremities Extremities: No edema  Neurological: Alert oriented x 4, grossly non-focal Psychological:  Alert and cooperative. Normal mood and affect  ASSESSMENT AND PLAN: -GERD:  Has been treated on and off for years.  Thinks it is causing her issues with breathing recently.  Had negative pulmonary evaluation in the past when she had similar problems.  Will schedule for EGD with Dr. Ardis Hughs.  Continue omeprazole 40 mg daily for now. -Loose stools:  Change from previous constipation.  Just has 1-3 looser stools per day.  She thought maybe it was the Stevia that she uses; could stop it to find out.  No worrisome features.  Otherwise I have suggested using a daily powder fiber supplement to help bulk her stools.  *The risks, benefits, and alternatives to EGD were discussed with the patient and she consents to proceed.    CC:  [name], *

## 2016-04-25 DIAGNOSIS — Z6823 Body mass index (BMI) 23.0-23.9, adult: Secondary | ICD-10-CM | POA: Diagnosis not present

## 2016-04-25 DIAGNOSIS — Z01419 Encounter for gynecological examination (general) (routine) without abnormal findings: Secondary | ICD-10-CM | POA: Diagnosis not present

## 2016-04-25 DIAGNOSIS — Z1231 Encounter for screening mammogram for malignant neoplasm of breast: Secondary | ICD-10-CM | POA: Diagnosis not present

## 2016-04-25 DIAGNOSIS — H26491 Other secondary cataract, right eye: Secondary | ICD-10-CM | POA: Diagnosis not present

## 2016-05-15 ENCOUNTER — Other Ambulatory Visit: Payer: Self-pay | Admitting: Internal Medicine

## 2016-06-02 ENCOUNTER — Other Ambulatory Visit: Payer: Self-pay | Admitting: Internal Medicine

## 2016-06-10 ENCOUNTER — Ambulatory Visit (AMBULATORY_SURGERY_CENTER): Payer: Federal, State, Local not specified - PPO | Admitting: Gastroenterology

## 2016-06-10 ENCOUNTER — Encounter: Payer: Self-pay | Admitting: Gastroenterology

## 2016-06-10 VITALS — BP 111/60 | HR 65 | Temp 98.0°F | Resp 15 | Ht 67.5 in | Wt 148.0 lb

## 2016-06-10 DIAGNOSIS — K219 Gastro-esophageal reflux disease without esophagitis: Secondary | ICD-10-CM | POA: Diagnosis not present

## 2016-06-10 DIAGNOSIS — K319 Disease of stomach and duodenum, unspecified: Secondary | ICD-10-CM | POA: Diagnosis not present

## 2016-06-10 DIAGNOSIS — K2900 Acute gastritis without bleeding: Secondary | ICD-10-CM

## 2016-06-10 MED ORDER — SODIUM CHLORIDE 0.9 % IV SOLN: 500.0000 mL | INTRAVENOUS | Status: DC

## 2016-06-10 NOTE — Op Note (Signed)
[facility] Patient Name: Renee [name] Procedure Date: 06/10/2016 9:18 AM MRN: [id number] Endoscopist: Milus Banister , MD Age: 55 Referring MD:  Date of Birth: July 16, 1960 Gender: Female Account #: [id number] Procedure:                Upper GI endoscopy Indications:              Dyspepsia also breathing difficulty Medicines:                Monitored Anesthesia Care Procedure:                Pre-Anesthesia Assessment:                           - Prior to the procedure, a History and Physical                            was performed, and patient medications and                            allergies were reviewed. The patient's tolerance of                            previous anesthesia was also reviewed. The risks                            and benefits of the procedure and the sedation                            options and risks were discussed with the patient.                            All questions were answered, and informed consent                            was obtained. Prior Anticoagulants: The patient has                            taken no previous anticoagulant or antiplatelet                            agents. ASA Grade Assessment: II - A patient with                            mild systemic disease. After reviewing the risks                            and benefits, the patient was deemed in                            satisfactory condition to undergo the procedure.                           After obtaining informed consent, the endoscope was  passed under direct vision. Throughout the                            procedure, the patient's blood pressure, pulse, and                            oxygen saturations were monitored continuously. The                            Model GIF-HQ190 [id number]) scope was introduced                            through the mouth, and advanced to the second part                            of duodenum. The  upper GI endoscopy was                            accomplished without difficulty. The patient                            tolerated the procedure well. Scope In: Scope Out: Findings:                 The esophagus was normal.                           Diffuse mild inflammation characterized by                            congestion (edema) and erythema was found in the                            entire examined stomach. Biopsies were taken with a                            cold forceps for histology.                           The examined duodenum was normal. Complications:            No immediate complications. Estimated blood loss:                            None. Estimated Blood Loss:     Estimated blood loss: none. Impression:               - Normal esophagus.                           - Gastritis. Biopsied to check for H. pylori.                           - Normal examined duodenum. Recommendation:           - Patient has a contact number available for  emergencies. The signs and symptoms of potential                            delayed complications were discussed with the                            patient. Return to normal activities tomorrow.                            Written discharge instructions were provided to the                            patient.                           - Resume previous diet.                           - Continue omeprazole 40mg  shortly before breakfast                            meal daily. Please also start ranitidine (150mg                             pill, one pill at bedtime nightly).                           - Await pathology results; if these are positive                            for H. pylori you will be started on appropriate                            antibiotics. Milus Banister, MD 06/10/2016 9:40:36 AM This report has been signed electronically.

## 2016-06-10 NOTE — Progress Notes (Signed)
To recovery, report to McCoy, RN, VSS 

## 2016-06-10 NOTE — Patient Instructions (Signed)
Discharge instructions given. Biopsies taken. Resume previous medications. YOU HAD AN ENDOSCOPIC PROCEDURE TODAY AT THE [facility]:   Refer to the procedure report that was given to you for any specific questions about what was found during the examination.  If the procedure report does not answer your questions, please call your gastroenterologist to clarify.  If you requested that your care partner not be given the details of your procedure findings, then the procedure report has been included in a sealed envelope for you to review at your convenience later.  YOU SHOULD EXPECT: Some feelings of bloating in the abdomen. Passage of more gas than usual.  Walking can help get rid of the air that was put into your GI tract during the procedure and reduce the bloating. If you had a lower endoscopy (such as a colonoscopy or flexible sigmoidoscopy) you may notice spotting of blood in your stool or on the toilet paper. If you underwent a bowel prep for your procedure, you may not have a normal bowel movement for a few days.  Please Note:  You might notice some irritation and congestion in your nose or some drainage.  This is from the oxygen used during your procedure.  There is no need for concern and it should clear up in a day or so.  SYMPTOMS TO REPORT IMMEDIATELY:   Following upper endoscopy (EGD)  Vomiting of blood or coffee ground material  New chest pain or pain under the shoulder blades  Painful or persistently difficult swallowing  New shortness of breath  Fever of 100F or higher  Black, tarry-looking stools  For urgent or emergent issues, a gastroenterologist can be reached at any hour by calling [phone].   DIET:  We do recommend a small meal at first, but then you may proceed to your regular diet.  Drink plenty of fluids but you should avoid alcoholic beverages for 24 hours.  ACTIVITY:  You should plan to take it easy for the rest of today and you should NOT DRIVE  or use heavy machinery until tomorrow (because of the sedation medicines used during the test).    FOLLOW UP: Our staff will call the number listed on your records the next business day following your procedure to check on you and address any questions or concerns that you may have regarding the information given to you following your procedure. If we do not reach you, we will leave a message.  However, if you are feeling well and you are not experiencing any problems, there is no need to return our call.  We will assume that you have returned to your regular daily activities without incident.  If any biopsies were taken you will be contacted by phone or by letter within the next 1-3 weeks.  Please call us at [phone] if you have not heard about the biopsies in 3 weeks.    SIGNATURES/CONFIDENTIALITY: You and/or your care partner have signed paperwork which will be entered into your electronic medical record.  These signatures attest to the fact that that the information above on your After Visit Summary has been reviewed and is understood.  Full responsibility of the confidentiality of this discharge information lies with you and/or your care-partner. 

## 2016-06-10 NOTE — Progress Notes (Signed)
Called to room to assist during endoscopic procedure.  Patient ID and intended procedure confirmed with present staff. Received instructions for my participation in the procedure from the performing physician.  

## 2016-06-11 ENCOUNTER — Telehealth: Payer: Self-pay

## 2016-06-11 NOTE — Telephone Encounter (Signed)
  Follow up Call-  Call back number 06/10/2016  Post procedure Call Back phone  # [phone]  Permission to leave phone message Yes  Some recent data might be hidden     Patient questions:  Do you have a fever, pain , or abdominal swelling? No. Pain Score  0 *  Have you tolerated food without any problems? Yes.    Have you been able to return to your normal activities? Yes.    Do you have any questions about your discharge instructions: Diet   No. Medications  No. Follow up visit  No.  Do you have questions or concerns about your Care? No.  Actions: * If pain score is 4 or above: No action needed, pain <4. Pt verbalize having a rash below the jaw line she thought it came from mouth piece. She put cream on rash and rash cleared up.

## 2016-06-17 ENCOUNTER — Encounter: Payer: Self-pay | Admitting: Gastroenterology

## 2016-07-26 DIAGNOSIS — L270 Generalized skin eruption due to drugs and medicaments taken internally: Secondary | ICD-10-CM | POA: Diagnosis not present

## 2016-08-30 DIAGNOSIS — L239 Allergic contact dermatitis, unspecified cause: Secondary | ICD-10-CM | POA: Diagnosis not present

## 2016-10-25 DIAGNOSIS — I788 Other diseases of capillaries: Secondary | ICD-10-CM | POA: Diagnosis not present

## 2016-10-25 DIAGNOSIS — D2262 Melanocytic nevi of left upper limb, including shoulder: Secondary | ICD-10-CM | POA: Diagnosis not present

## 2016-10-25 DIAGNOSIS — D2261 Melanocytic nevi of right upper limb, including shoulder: Secondary | ICD-10-CM | POA: Diagnosis not present

## 2016-10-25 DIAGNOSIS — L570 Actinic keratosis: Secondary | ICD-10-CM | POA: Diagnosis not present

## 2016-11-14 ENCOUNTER — Other Ambulatory Visit: Payer: Self-pay | Admitting: Internal Medicine

## 2016-11-29 ENCOUNTER — Encounter: Payer: Self-pay | Admitting: Internal Medicine

## 2016-11-29 ENCOUNTER — Ambulatory Visit (INDEPENDENT_AMBULATORY_CARE_PROVIDER_SITE_OTHER): Payer: Federal, State, Local not specified - PPO | Admitting: Internal Medicine

## 2016-11-29 DIAGNOSIS — M7061 Trochanteric bursitis, right hip: Secondary | ICD-10-CM

## 2016-11-29 MED ORDER — PREDNISONE 20 MG PO TABS: 40.0000 mg | ORAL_TABLET | Freq: Every day | ORAL | 0 refills | Status: DC

## 2016-11-29 NOTE — Patient Instructions (Signed)
We have sent in the prednisone to take 2 pills daily for 5 days.  We have also given you some exercises to do to help heal the hip area.   Turmeric is a good supplement that can help slow down arthritis.    Trochanteric Bursitis Rehab Ask your health care provider which exercises are safe for you. Do exercises exactly as told by your health care provider and adjust them as directed. It is normal to feel mild stretching, pulling, tightness, or discomfort as you do these exercises, but you should stop right away if you feel sudden pain or your pain gets worse.Do not begin these exercises until told by your health care provider. Stretching exercises These exercises warm up your muscles and joints and improve the movement and flexibility of your hip. These exercises also help to relieve pain and stiffness. Exercise A: Iliotibial band stretch  1. Lie on your side with your left / right leg in the top position. 2. Bend your left / right knee and grab your ankle. 3. Slowly bring your knee back so your thigh is behind your body. 4. Slowly lower your knee toward the floor until you feel a gentle stretch on the outside of your left / right thigh. If you do not feel a stretch and your knee will not fall farther, place the heel of your other foot on top of your outer knee and pull your thigh down farther. 5. Hold this position for __________ seconds. 6. Slowly return to the starting position. Repeat __________ times. Complete this exercise __________ times a day. Strengthening exercises These exercises build strength and endurance in your hip and pelvis. Endurance is the ability to use your muscles for a long time, even after they get tired. Exercise B: Bridge ( hip extensors) 1. Lie on your back on a firm surface with your knees bent and your feet flat on the floor. 2. Tighten your buttocks muscles and lift your buttocks off the floor until your trunk is level with your thighs. You should feel the  muscles working in your buttocks and the back of your thighs. If this exercise is too easy, try doing it with your arms crossed over your chest. 3. Hold this position for __________ seconds. 4. Slowly return to the starting position. 5. Let your muscles relax completely between repetitions. Repeat __________ times. Complete this exercise __________ times a day. Exercise C: Squats ( knee extensors and  quadriceps) 1. Stand in front of a table, with your feet and knees pointing straight ahead. You may rest your hands on the table for balance but not for support. 2. Slowly bend your knees and lower your hips like you are going to sit in a chair. ? Keep your weight over your heels, not over your toes. ? Keep your lower legs upright so they are parallel with the table legs. ? Do not let your hips go lower than your knees. ? Do not bend lower than told by your health care provider. ? If your hip pain increases, do not bend as low. 3. Hold this position for __________ seconds. 4. Slowly push with your legs to return to standing. Do not use your hands to pull yourself to standing. Repeat __________ times. Complete this exercise __________ times a day. Exercise D: Hip hike 1. Stand sideways on a bottom step. Stand on your left / right leg with your other foot unsupported next to the step. You can hold onto the railing or wall if needed  for balance. 2. Keeping your knees straight and your torso square, lift your left / right hip up toward the ceiling. 3. Hold this position for __________ seconds. 4. Slowly let your left / right hip lower toward the floor, past the starting position. Your foot should get closer to the floor. Do not lean or bend your knees. Repeat __________ times. Complete this exercise __________ times a day. Exercise E: Single leg stand 1. Stand near a counter or door frame that you can hold onto for balance as needed. It is helpful to stand in front of a mirror for this exercise so  you can watch your hip. 2. Squeeze your left / right buttock muscles then lift up your other foot. Do not let your left / right hip push out to the side. 3. Hold this position for __________ seconds. Repeat __________ times. Complete this exercise __________ times a day. This information is not intended to replace advice given to you by your health care provider. Make sure you discuss any questions you have with your health care provider. Document Released: 07/25/2004 Document Revised: 02/22/2016 Document Reviewed: 06/02/2015 Elsevier Interactive Patient Education  [employer].

## 2016-11-29 NOTE — Progress Notes (Signed)
   Subjective:    Patient ID: Renee [name], female    DOB: 1960/10/17, 56 y.o.   MRN: [id number]  HPI The patient is a 56 YO female coming in for right hip pain laterally. Started about 5 weeks ago and is about the same since onset. Pain is 4/10 all the time. Some worse with lying on her side and she denies injury or overuse at the onset. She denies rash. Has been using tylenol and aleve for pain with some relief. More pain with sitting or standing for a long time. Denies worsening since onset. No radiation or numbness or weakness to her legs or back. Some radiation to the calf with cramping pain. Pain has not caused change in her activity.   Review of Systems  Constitutional: Negative for activity change, appetite change, fatigue, fever and unexpected weight change.  Respiratory: Negative.   Cardiovascular: Negative.   Gastrointestinal: Negative.   Musculoskeletal: Positive for arthralgias and myalgias. Negative for back pain, gait problem and neck pain.  Skin: Negative.   Neurological: Negative.       Objective:   Physical Exam  Constitutional: She is oriented to person, place, and time. She appears well-developed and well-nourished.  HENT:  Head: Normocephalic and atraumatic.  Eyes: EOM are normal.  Neck: Normal range of motion.  Cardiovascular: Normal rate and regular rhythm.   Pulmonary/Chest: Effort normal.  Abdominal: Soft.  Musculoskeletal: She exhibits tenderness.  Pain over the right trochanteric bursa.   Neurological: She is alert and oriented to person, place, and time. Coordination normal.  Skin: Skin is warm and dry.   Vitals:   11/29/16 0811  BP: 110/64  Pulse: 77  Resp: 12  Temp: 97.9 F (36.6 C)  TempSrc: Oral  SpO2: 99%  Weight: 149 lb (67.6 kg)  Height: 5' 7.5" (1.715 m)      Assessment & Plan:

## 2016-11-30 DIAGNOSIS — M7061 Trochanteric bursitis, right hip: Secondary | ICD-10-CM | POA: Insufficient documentation

## 2016-11-30 NOTE — Assessment & Plan Note (Signed)
Rx for prednisone burst for the pain and stretching for rehab. Encouraged to use tylenol and aleve for pain as needed and resume activity as able.

## 2016-12-02 DIAGNOSIS — K080 Exfoliation of teeth due to systemic causes: Secondary | ICD-10-CM | POA: Diagnosis not present

## 2017-01-16 DIAGNOSIS — K080 Exfoliation of teeth due to systemic causes: Secondary | ICD-10-CM | POA: Diagnosis not present

## 2017-01-20 DIAGNOSIS — K080 Exfoliation of teeth due to systemic causes: Secondary | ICD-10-CM | POA: Diagnosis not present

## 2017-01-21 IMAGING — CR DG CHEST 2V
2 series · 2 of 2 positions shown · non-contrast
Comparison: None.

CLINICAL DATA: Acute onset of left-sided chest pain. Initial
encounter.

EXAM:
CHEST  2 VIEW

[w chest pa]
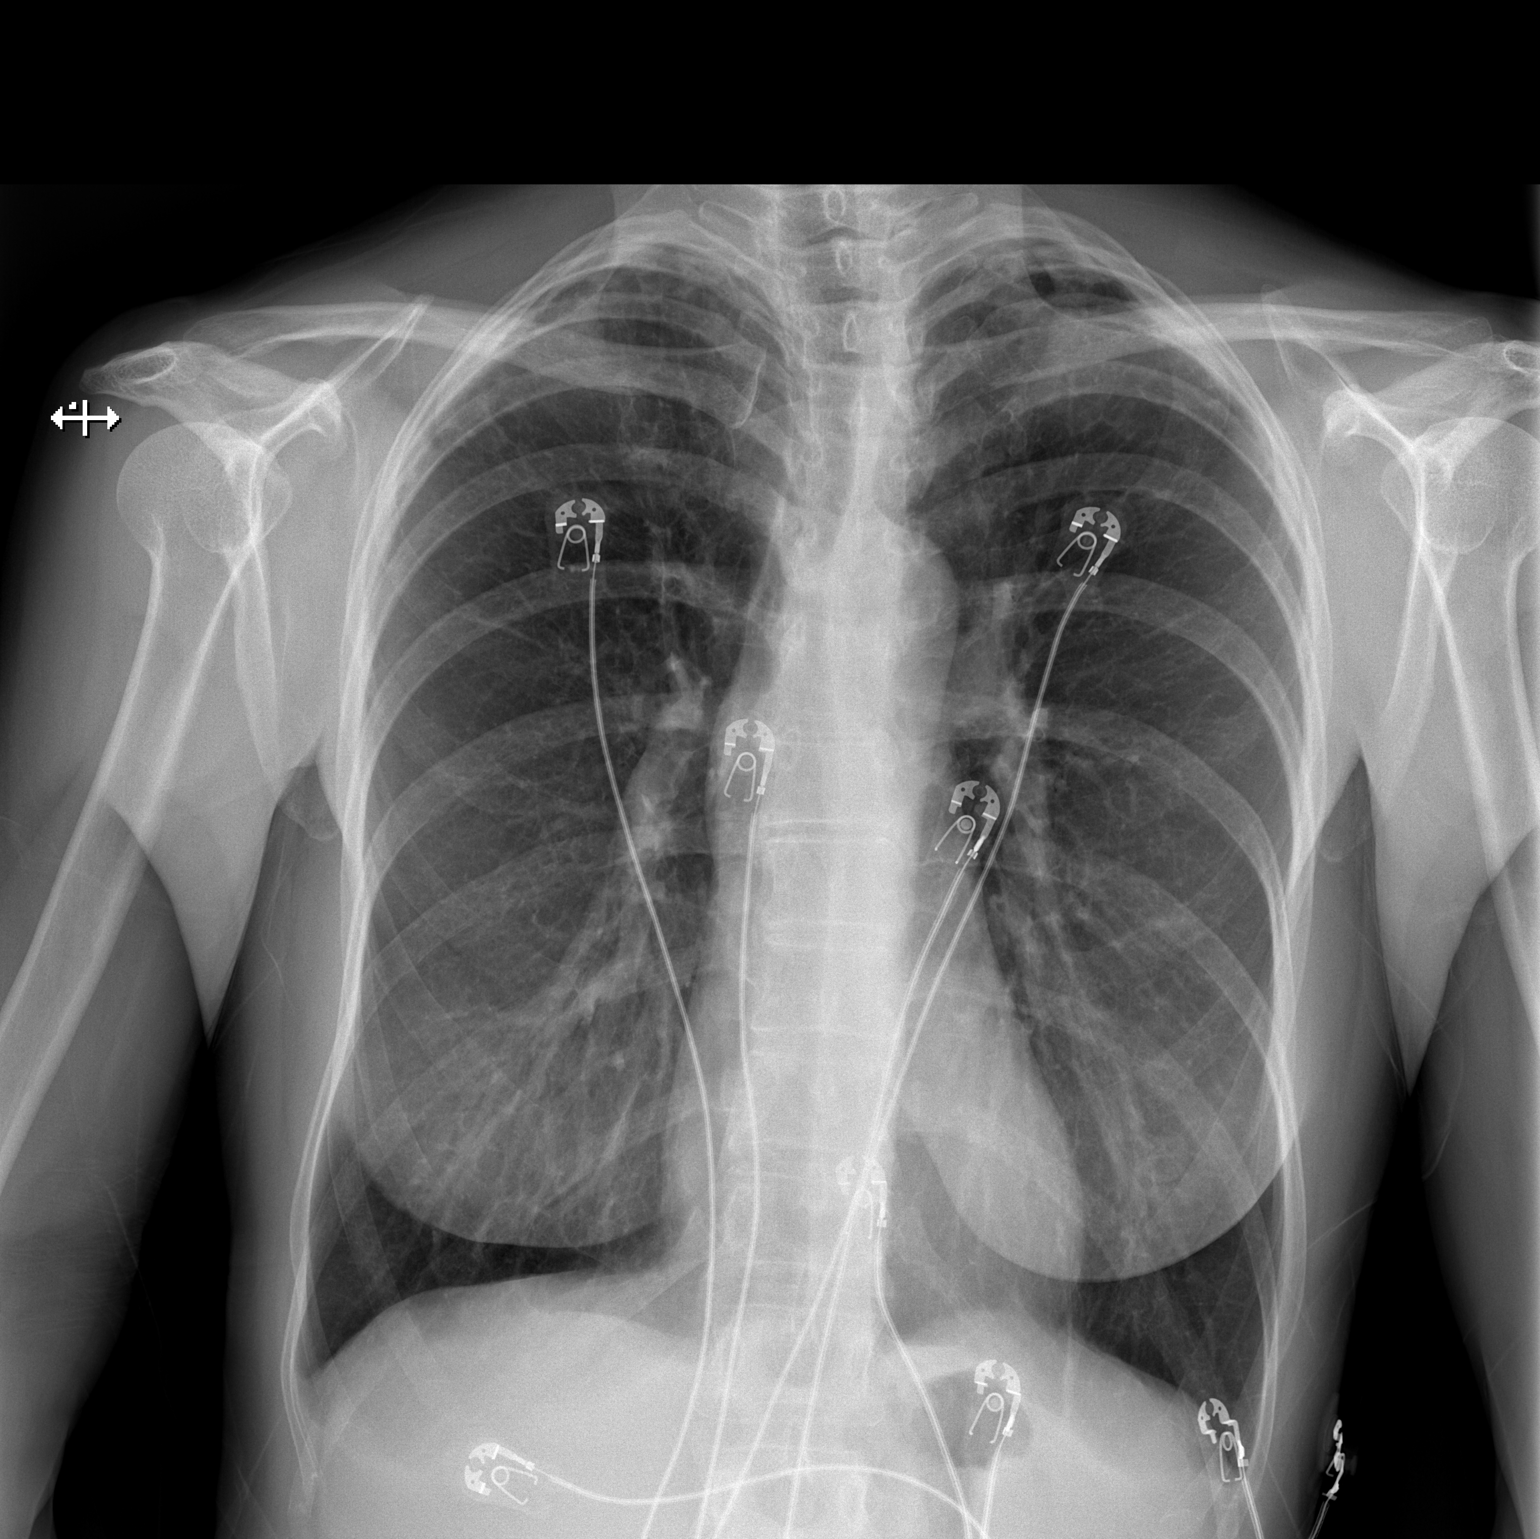

[w chest lat]
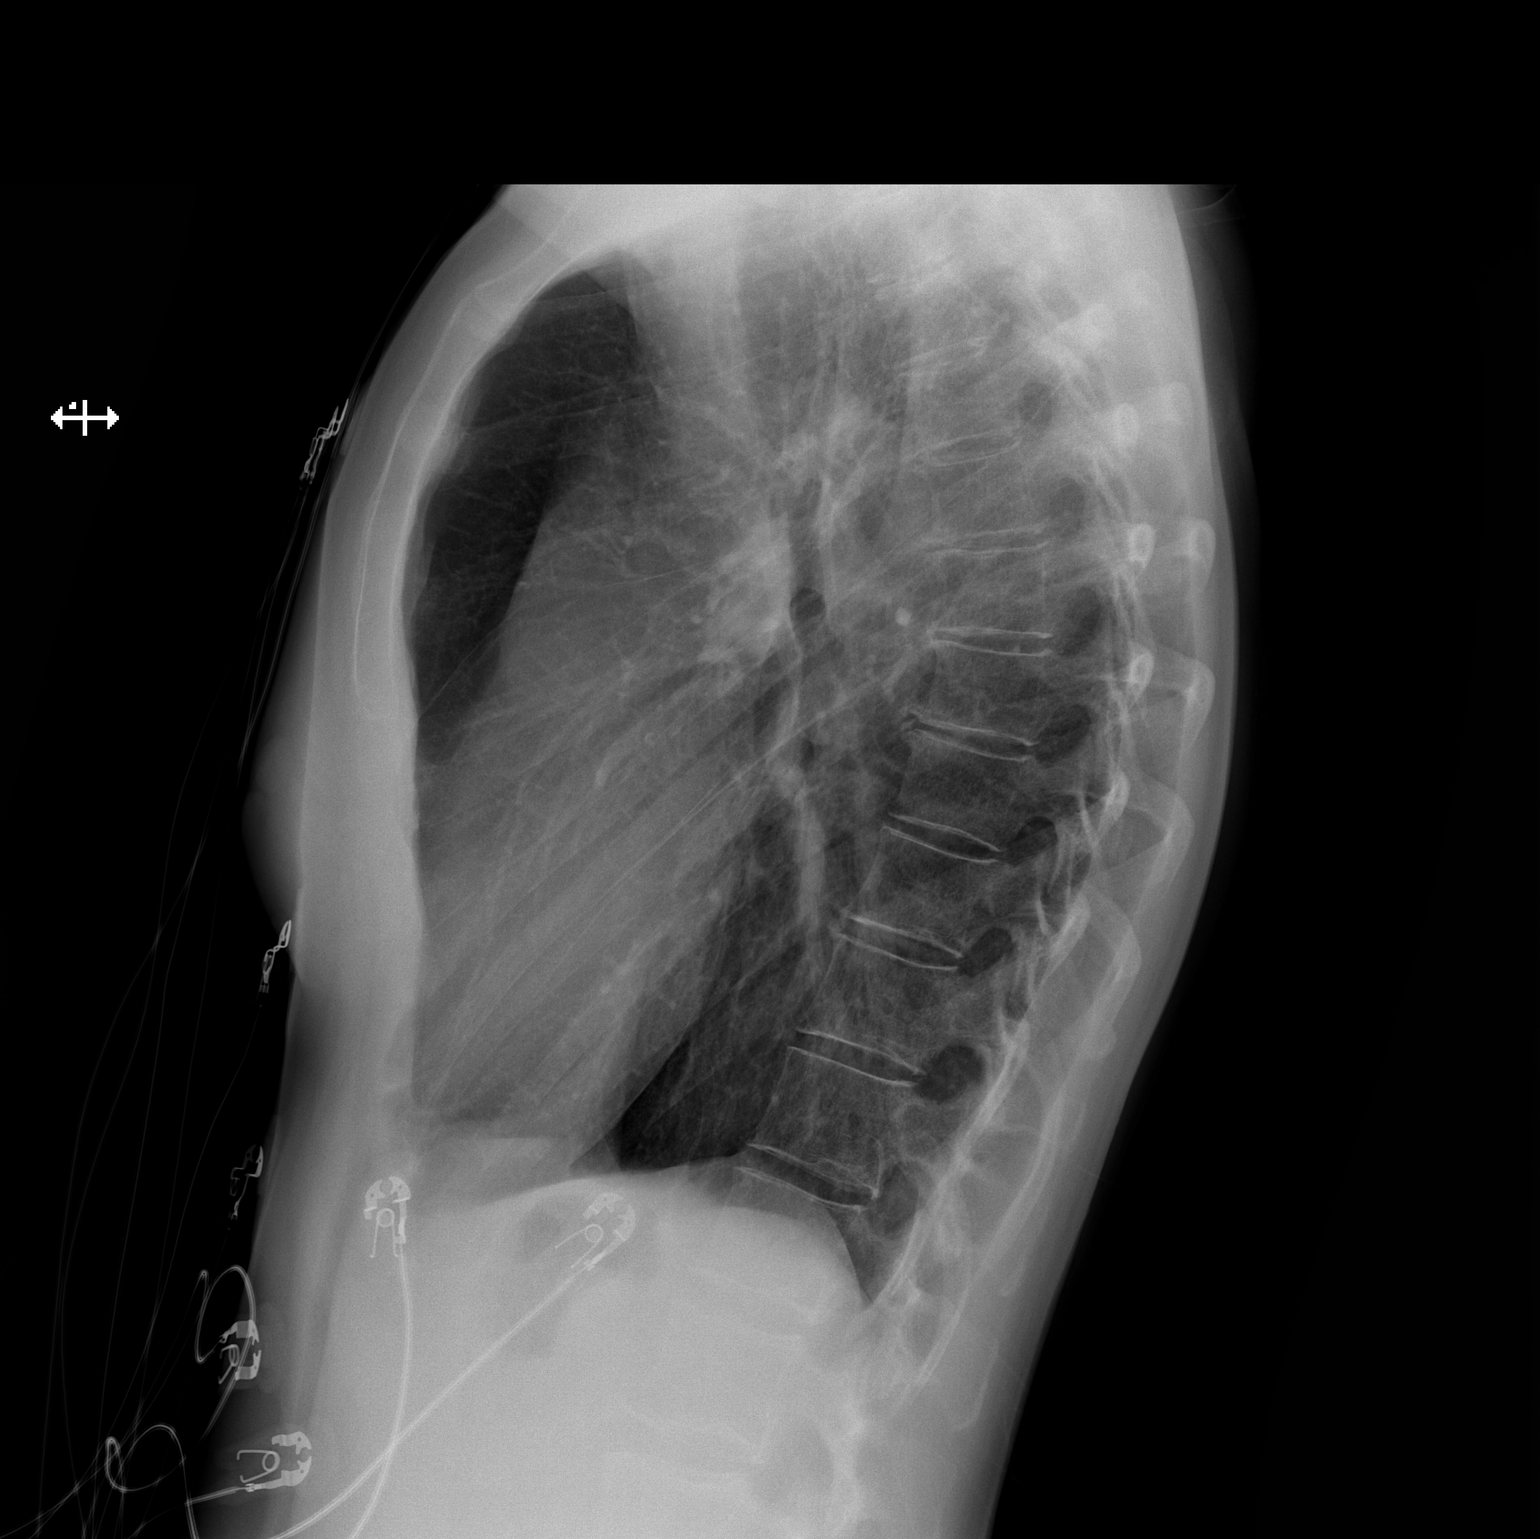

[2 of 2 positions shown; findings below may reference images not displayed]

FINDINGS: The lungs are hyperexpanded, with flattening of the hemidiaphragms,
compatible with COPD. Mild scarring is noted at the lung apices.
There is no evidence of focal opacification, pleural effusion or
pneumothorax.

The heart is normal in size; the mediastinal contour is within
normal limits. No acute osseous abnormalities are seen.
IMPRESSION: Findings of COPD.  Lungs otherwise grossly clear.

## 2017-02-05 DIAGNOSIS — K080 Exfoliation of teeth due to systemic causes: Secondary | ICD-10-CM | POA: Diagnosis not present

## 2017-02-19 ENCOUNTER — Encounter: Payer: Self-pay | Admitting: Gastroenterology

## 2017-02-24 ENCOUNTER — Telehealth: Payer: Self-pay | Admitting: Internal Medicine

## 2017-02-24 NOTE — Telephone Encounter (Signed)
sertraline (ZOLOFT) 100 MG tablet   Patient is requesting that her zoloft be reduced to 75mg . She wanted to know if this could be changed without making an appointment. Please advise if patient does need an appointment. Thank you.

## 2017-02-25 MED ORDER — SERTRALINE HCL 50 MG PO TABS: 75.0000 mg | ORAL_TABLET | Freq: Every day | ORAL | 0 refills | Status: DC

## 2017-02-25 NOTE — Telephone Encounter (Signed)
Medication sent to pharmacy  

## 2017-02-25 NOTE — Telephone Encounter (Signed)
Notified pt dosage has been decreased new rx sent to [facility].../[facility]mb

## 2017-03-25 ENCOUNTER — Other Ambulatory Visit: Payer: Self-pay | Admitting: Family

## 2017-04-08 ENCOUNTER — Other Ambulatory Visit (INDEPENDENT_AMBULATORY_CARE_PROVIDER_SITE_OTHER): Payer: Federal, State, Local not specified - PPO

## 2017-04-08 ENCOUNTER — Encounter: Payer: Self-pay | Admitting: Internal Medicine

## 2017-04-08 ENCOUNTER — Ambulatory Visit (INDEPENDENT_AMBULATORY_CARE_PROVIDER_SITE_OTHER): Payer: Federal, State, Local not specified - PPO | Admitting: Internal Medicine

## 2017-04-08 VITALS — BP 102/70 | HR 66 | Temp 97.8°F | Ht 67.5 in | Wt 151.0 lb

## 2017-04-08 DIAGNOSIS — Z0000 Encounter for general adult medical examination without abnormal findings: Secondary | ICD-10-CM

## 2017-04-08 DIAGNOSIS — Z23 Encounter for immunization: Secondary | ICD-10-CM

## 2017-04-08 DIAGNOSIS — F3341 Major depressive disorder, recurrent, in partial remission: Secondary | ICD-10-CM | POA: Diagnosis not present

## 2017-04-08 LAB — CBC
HCT: 40.7 % (ref 36.0–46.0)
Hemoglobin: 13.5 g/dL (ref 12.0–15.0)
MCHC: 33.1 g/dL (ref 30.0–36.0)
MCV: 90.1 fl (ref 78.0–100.0)
Platelets: 191 10*3/uL (ref 150.0–400.0)
RBC: 4.51 Mil/uL (ref 3.87–5.11)
RDW: 13.2 % (ref 11.5–15.5)
WBC: 4.3 10*3/uL (ref 4.0–10.5)

## 2017-04-08 LAB — LIPID PANEL
CHOL/HDL RATIO: 3
Cholesterol: 209 mg/dL — ABNORMAL HIGH (ref 0–200)
HDL: 68.1 mg/dL (ref >39.00)
LDL CALC: 127 mg/dL — AB (ref 0–99)
NONHDL: 140.7
Triglycerides: 67 mg/dL (ref 0.0–149.0)
VLDL: 13.4 mg/dL (ref 0.0–40.0)

## 2017-04-08 LAB — COMPREHENSIVE METABOLIC PANEL
ALK PHOS: 63 U/L (ref 39–117)
ALT: 8 U/L (ref 0–35)
AST: 12 U/L (ref 0–37)
Albumin: 4.3 g/dL (ref 3.5–5.2)
BUN: 14 mg/dL (ref 6–23)
CO2: 29 mEq/L (ref 19–32)
Calcium: 9.8 mg/dL (ref 8.4–10.5)
Chloride: 103 mEq/L (ref 96–112)
Creatinine, Ser: 0.89 mg/dL (ref 0.40–1.20)
GFR: 69.78 mL/min (ref >60.00)
Glucose, Bld: 75 mg/dL (ref 70–99)
Potassium: 4.4 mEq/L (ref 3.5–5.1)
Sodium: 139 mEq/L (ref 135–145)
Total Bilirubin: 0.7 mg/dL (ref 0.2–1.2)
Total Protein: 7.2 g/dL (ref 6.0–8.3)

## 2017-04-08 LAB — TSH: TSH: 3.2 u[IU]/mL (ref 0.35–4.50)

## 2017-04-08 MED ORDER — FLUTICASONE PROPIONATE 50 MCG/ACT NA SUSP: 2.0000 | Freq: Every day | NASAL | 6 refills | Status: DC

## 2017-04-08 NOTE — Patient Instructions (Signed)
We have sent in the flonase (fluticasone) to use 2 sprays each nostril daily during allergy season.  We are checking the labs today.   The zoloft should level out within a month. If you are still having these symptoms it is okay to go up to 100 mg daily.   The attention specialists can help out adjust to focus and work on memory. Their number is [phone]

## 2017-04-08 NOTE — Progress Notes (Signed)
   Subjective:    Patient ID: Renee [name], female    DOB: Jun 16, 1961, 56 y.o.   MRN: [id number]  HPI The patient is a 56 YO female coming in for concerns about her zoloft. This was originally started for some depression and anxiety. She has tried to decrease from 100 mg daily to 75 mg daily. Having more hot flashes and some more restlessness. She is also finding herself with less focus doing normal tasks. She is wondering if she can get off the zoloft or if it has side effects from being on long term. Denies SI/HI. Not sleeping as well. Some guilt during the day.   Review of Systems  Constitutional: Negative.   Respiratory: Negative for cough, chest tightness and shortness of breath.   Cardiovascular: Negative for chest pain, palpitations and leg swelling.  Gastrointestinal: Negative for abdominal distention, abdominal pain, constipation, diarrhea, nausea and vomiting.  Musculoskeletal: Negative.   Skin: Negative.   Neurological: Negative.   Psychiatric/Behavioral: Positive for decreased concentration, dysphoric mood and sleep disturbance. Negative for agitation, behavioral problems, confusion, hallucinations, self-injury and suicidal ideas. The patient is nervous/anxious. The patient is not hyperactive.       Objective:   Physical Exam  Constitutional: She is oriented to person, place, and time. She appears well-developed and well-nourished.  HENT:  Head: Normocephalic and atraumatic.  Eyes: EOM are normal.  Neck: Normal range of motion.  Cardiovascular: Normal rate and regular rhythm.   Pulmonary/Chest: Effort normal and breath sounds normal. No respiratory distress. She has no wheezes. She has no rales.  Abdominal: Soft. She exhibits no distension. There is no tenderness. There is no rebound.  Neurological: She is alert and oriented to person, place, and time. Coordination normal.  Skin: Skin is warm and dry.  Psychiatric: She has a normal mood and affect.   Vitals:   04/08/17  0911  BP: 102/70  Pulse: 66  Temp: 97.8 F (36.6 C)  TempSrc: Oral  SpO2: 99%  Weight: 151 lb (68.5 kg)  Height: 5' 7.5" (1.715 m)      Assessment & Plan:  Flu shot given at visit.

## 2017-04-08 NOTE — Assessment & Plan Note (Signed)
Partially in remission. She has tried to decrease from 100 mg daily to 75 mg daily with return of her symptoms. Advised to try another month and if symptoms do not level out consider return to 100 mg daily and try again at a later time to wean. Advised that she can see attention specialist for coping and behavioral strategies to help with focus and attention.

## 2017-05-05 DIAGNOSIS — H52203 Unspecified astigmatism, bilateral: Secondary | ICD-10-CM | POA: Diagnosis not present

## 2017-05-05 DIAGNOSIS — Z961 Presence of intraocular lens: Secondary | ICD-10-CM | POA: Diagnosis not present

## 2017-05-05 DIAGNOSIS — H1789 Other corneal scars and opacities: Secondary | ICD-10-CM | POA: Diagnosis not present

## 2017-05-17 ENCOUNTER — Other Ambulatory Visit: Payer: Self-pay | Admitting: Internal Medicine

## 2017-06-17 DIAGNOSIS — Z1231 Encounter for screening mammogram for malignant neoplasm of breast: Secondary | ICD-10-CM | POA: Diagnosis not present

## 2017-06-17 DIAGNOSIS — Z1212 Encounter for screening for malignant neoplasm of rectum: Secondary | ICD-10-CM | POA: Diagnosis not present

## 2017-06-17 DIAGNOSIS — Z01419 Encounter for gynecological examination (general) (routine) without abnormal findings: Secondary | ICD-10-CM | POA: Diagnosis not present

## 2017-06-17 DIAGNOSIS — Z6823 Body mass index (BMI) 23.0-23.9, adult: Secondary | ICD-10-CM | POA: Diagnosis not present

## 2017-06-17 LAB — HM MAMMOGRAPHY

## 2017-06-17 LAB — HM PAP SMEAR

## 2017-07-24 DIAGNOSIS — K080 Exfoliation of teeth due to systemic causes: Secondary | ICD-10-CM | POA: Diagnosis not present

## 2017-07-28 DIAGNOSIS — K080 Exfoliation of teeth due to systemic causes: Secondary | ICD-10-CM | POA: Diagnosis not present

## 2017-08-12 ENCOUNTER — Ambulatory Visit: Payer: Federal, State, Local not specified - PPO | Admitting: Internal Medicine

## 2017-08-12 ENCOUNTER — Ambulatory Visit (INDEPENDENT_AMBULATORY_CARE_PROVIDER_SITE_OTHER)
Admission: RE | Admit: 2017-08-12 | Discharge: 2017-08-12 | Disposition: A | Discharge status: Home / Self Care | Payer: Federal, State, Local not specified - PPO | Source: Ambulatory Visit | Attending: Internal Medicine | Admitting: Internal Medicine

## 2017-08-12 ENCOUNTER — Encounter: Payer: Self-pay | Admitting: Internal Medicine

## 2017-08-12 VITALS — BP 100/78 | HR 76 | Temp 98.6°F | Ht 67.5 in | Wt 143.0 lb

## 2017-08-12 DIAGNOSIS — R059 Cough, unspecified: Secondary | ICD-10-CM | POA: Insufficient documentation

## 2017-08-12 DIAGNOSIS — R05 Cough: Secondary | ICD-10-CM

## 2017-08-12 MED ORDER — SERTRALINE HCL 50 MG PO TABS: ORAL_TABLET | ORAL | 5 refills | Status: DC

## 2017-08-12 MED ORDER — DOXYCYCLINE HYCLATE 100 MG PO TABS: 100.0000 mg | ORAL_TABLET | Freq: Two times a day (BID) | ORAL | 0 refills | Status: DC

## 2017-08-12 NOTE — Patient Instructions (Signed)
We have sent in doxycycline to take 1 pill twice a day for 1 week.  We are checking the chest x-ray today and will call you back about the results.

## 2017-08-12 NOTE — Assessment & Plan Note (Signed)
Checking CXR given the pneumonia exposure. Lungs are clear on exam. Will treat given time course with doxycyline and advised to start zyrtec for drainage.

## 2017-08-12 NOTE — Progress Notes (Signed)
   Subjective:    Patient ID: Renee [name], female    DOB: Sep 09, 1960, 57 y.o.   MRN: [id number]  HPI The patient is a 57 YO female coming in for cold and cough symptoms for about 1-2 weeks. She has grandchildren living with her and they have been sick. 1 with pneumonia and she was worried about it being contagious. She has been having chills but no fevers. She denies SOB with activity. She is coughing up green to yellow sputum. She is having mild congestion and nose drainage. She denies ear pain. Some headaches. Taking mucinex and afrin which help temporarily but not making her better. She is concerned about pneumonia.   Review of Systems  Constitutional: Positive for activity change, appetite change and chills. Negative for fatigue, fever and unexpected weight change.  HENT: Positive for congestion, postnasal drip, rhinorrhea and sinus pressure. Negative for ear discharge, ear pain, sinus pain, sneezing, sore throat, tinnitus, trouble swallowing and voice change.   Eyes: Negative.   Respiratory: Positive for cough. Negative for chest tightness, shortness of breath and wheezing.   Cardiovascular: Negative.   Gastrointestinal: Negative.   Musculoskeletal: Positive for myalgias.  Neurological: Positive for headaches.      Objective:   Physical Exam  Constitutional: She is oriented to person, place, and time. She appears well-developed and well-nourished.  HENT:  Head: Normocephalic and atraumatic.  Oropharynx with redness and clear drainage, nose with swollen turbinates, TMs normal bilaterally  Eyes: EOM are normal.  Neck: Normal range of motion. No thyromegaly present.  Cardiovascular: Normal rate and regular rhythm.  Pulmonary/Chest: Effort normal and breath sounds normal. No respiratory distress. She has no wheezes. She has no rales.  Abdominal: Soft.  Musculoskeletal: She exhibits tenderness.  Lymphadenopathy:    She has no cervical adenopathy.  Neurological: She is alert and  oriented to person, place, and time.  Skin: Skin is warm and dry.   Vitals:   08/12/17 1317  BP: 100/78  Pulse: 76  Temp: 98.6 F (37 C)  TempSrc: Oral  SpO2: 98%  Weight: 143 lb (64.9 kg)  Height: 5' 7.5" (1.715 m)      Assessment & Plan:

## 2017-08-28 DIAGNOSIS — K080 Exfoliation of teeth due to systemic causes: Secondary | ICD-10-CM | POA: Diagnosis not present

## 2017-12-11 DIAGNOSIS — D225 Melanocytic nevi of trunk: Secondary | ICD-10-CM | POA: Diagnosis not present

## 2017-12-11 DIAGNOSIS — D2271 Melanocytic nevi of right lower limb, including hip: Secondary | ICD-10-CM | POA: Diagnosis not present

## 2017-12-11 DIAGNOSIS — L821 Other seborrheic keratosis: Secondary | ICD-10-CM | POA: Diagnosis not present

## 2017-12-11 DIAGNOSIS — D2272 Melanocytic nevi of left lower limb, including hip: Secondary | ICD-10-CM | POA: Diagnosis not present

## 2017-12-19 ENCOUNTER — Ambulatory Visit: Payer: Federal, State, Local not specified - PPO | Admitting: Internal Medicine

## 2017-12-19 ENCOUNTER — Encounter: Payer: Self-pay | Admitting: Internal Medicine

## 2017-12-19 ENCOUNTER — Ambulatory Visit (INDEPENDENT_AMBULATORY_CARE_PROVIDER_SITE_OTHER)
Admission: RE | Admit: 2017-12-19 | Discharge: 2017-12-19 | Disposition: A | Discharge status: Home / Self Care | Payer: Federal, State, Local not specified - PPO | Source: Ambulatory Visit | Attending: Internal Medicine | Admitting: Internal Medicine

## 2017-12-19 VITALS — BP 104/70 | HR 63 | Temp 98.2°F | Ht 67.5 in | Wt 138.0 lb

## 2017-12-19 DIAGNOSIS — M25562 Pain in left knee: Secondary | ICD-10-CM | POA: Diagnosis not present

## 2017-12-19 DIAGNOSIS — E010 Iodine-deficiency related diffuse (endemic) goiter: Secondary | ICD-10-CM

## 2017-12-19 DIAGNOSIS — G8929 Other chronic pain: Secondary | ICD-10-CM

## 2017-12-19 DIAGNOSIS — M25561 Pain in right knee: Secondary | ICD-10-CM

## 2017-12-19 DIAGNOSIS — Z808 Family history of malignant neoplasm of other organs or systems: Secondary | ICD-10-CM

## 2017-12-19 NOTE — Assessment & Plan Note (Signed)
2 family members with thyroid cancer. Checking US thyroid for some mild swelling on exam and dysphagia symptoms. Referral to endo for consultation about risk for thyroid cancer.

## 2017-12-19 NOTE — Patient Instructions (Signed)
We will get the ultrasound of the thyroid and get you in with the thyroid specialist.   We will check the x-ray of the knees and in with the sports medicine.   Prevagen is an over the counter for memory.

## 2017-12-19 NOTE — Assessment & Plan Note (Signed)
Referral to sports medicine. X-ray of left and right knee to evaluate for traumatic arthritis. Advised turmeric to help with pain. May need injections.
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
## 2017-12-29 ENCOUNTER — Encounter: Payer: Self-pay | Admitting: Internal Medicine

## 2017-12-29 NOTE — Progress Notes (Signed)
Abstracted and sent to scan  

## 2018-01-07 ENCOUNTER — Ambulatory Visit
Admission: RE | Admit: 2018-01-07 | Discharge: 2018-01-07 | Disposition: A | Discharge status: Home / Self Care | Payer: Federal, State, Local not specified - PPO | Source: Ambulatory Visit | Attending: Internal Medicine | Admitting: Internal Medicine

## 2018-01-07 DIAGNOSIS — E010 Iodine-deficiency related diffuse (endemic) goiter: Secondary | ICD-10-CM | POA: Diagnosis not present

## 2018-01-07 DIAGNOSIS — Z808 Family history of malignant neoplasm of other organs or systems: Secondary | ICD-10-CM
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
## 2018-01-15 ENCOUNTER — Ambulatory Visit: Payer: Self-pay

## 2018-01-15 ENCOUNTER — Ambulatory Visit: Payer: Federal, State, Local not specified - PPO | Admitting: Family Medicine

## 2018-01-15 ENCOUNTER — Encounter: Payer: Self-pay | Admitting: Family Medicine

## 2018-01-15 VITALS — BP 122/80 | HR 73 | Ht 67.5 in | Wt 138.0 lb

## 2018-01-15 DIAGNOSIS — M25562 Pain in left knee: Secondary | ICD-10-CM | POA: Diagnosis not present

## 2018-01-15 DIAGNOSIS — G8929 Other chronic pain: Secondary | ICD-10-CM

## 2018-01-15 DIAGNOSIS — M25561 Pain in right knee: Principal | ICD-10-CM

## 2018-01-15 DIAGNOSIS — M222X2 Patellofemoral disorders, left knee: Secondary | ICD-10-CM

## 2018-01-15 DIAGNOSIS — M222X1 Patellofemoral disorders, right knee: Secondary | ICD-10-CM

## 2018-01-15 MED ORDER — DICLOFENAC SODIUM 2 % TD SOLN: 2.0000 g | Freq: Two times a day (BID) | TRANSDERMAL | 3 refills | Status: DC

## 2018-01-15 NOTE — Patient Instructions (Addendum)
Good to see you.  Ice 20 minutes 2 times daily. Usually after activity and before bed. Exercises 3 times a week.  pennsaid pinkie amount topically 2 times daily as needed.  Try biking more for exercise.  Vitamin D 2000 IU daily  Turmeric 500mg   Daily  Tart cherry extract pills any dose at night could help  Good shoes with rigid bottom.  Renee [name], [place] or New balance greater then 700 Spenco orthotics "total support" online would be great  See me again in 4 weeks

## 2018-01-15 NOTE — Assessment & Plan Note (Signed)
Patellofemoral Syndrome  Reviewed anatomy using anatomical model and how PFS occurs.  Given rehab exercises handout for VMO, hip abductors, core, entire kinetic chain including proprioception exercises including cone touches, step downs, hip elevations and turn outs.  Could benefit from PT, regular exercise, upright biking, and a PFS knee brace to assist with tracking abnormalities. Patient given prescription for topical anti-inflammatories, hold on bracing, will do recumbent bike.  Follow-up again in 4 weeks

## 2018-01-30 DIAGNOSIS — E039 Hypothyroidism, unspecified: Secondary | ICD-10-CM | POA: Diagnosis not present

## 2018-01-30 DIAGNOSIS — E041 Nontoxic single thyroid nodule: Secondary | ICD-10-CM | POA: Diagnosis not present

## 2018-01-30 DIAGNOSIS — Z808 Family history of malignant neoplasm of other organs or systems: Secondary | ICD-10-CM | POA: Diagnosis not present

## 2018-02-13 ENCOUNTER — Ambulatory Visit: Payer: Federal, State, Local not specified - PPO | Admitting: Family Medicine

## 2018-03-04 DIAGNOSIS — K080 Exfoliation of teeth due to systemic causes: Secondary | ICD-10-CM | POA: Diagnosis not present

## 2018-03-11 DIAGNOSIS — K080 Exfoliation of teeth due to systemic causes: Secondary | ICD-10-CM | POA: Diagnosis not present

## 2018-03-16 ENCOUNTER — Ambulatory Visit: Payer: Federal, State, Local not specified - PPO | Admitting: Family Medicine

## 2018-03-16 ENCOUNTER — Encounter: Payer: Self-pay | Admitting: Family Medicine

## 2018-03-16 VITALS — BP 100/60 | HR 67 | Ht 67.5 in | Wt 138.0 lb

## 2018-03-16 DIAGNOSIS — M222X1 Patellofemoral disorders, right knee: Secondary | ICD-10-CM

## 2018-03-16 DIAGNOSIS — M222X2 Patellofemoral disorders, left knee: Secondary | ICD-10-CM

## 2018-03-16 DIAGNOSIS — M25562 Pain in left knee: Secondary | ICD-10-CM | POA: Diagnosis not present

## 2018-03-16 NOTE — Assessment & Plan Note (Signed)
Patellofemoral syndrome.  Patient has been somewhat noncompliant.  Does have a positive grind still noted.  Patient is referred to physical therapy.  I believe that this will be helpful.  Discussed continuing all other regimens.  Follow-up with me again in 4 to 6 weeks

## 2018-03-16 NOTE — Progress Notes (Signed)
[name] [address] [place], [place] [address] Phone: [phone] Subjective:    I [name] am serving as a Education administrator for Dr. Hulan Saas.    CC: Bilateral knee pain  [id number]  Renee [name] is a 57 y.o. female coming in with complaint of bilateral knee pain. States that her knees are about the same. Shortly after seeing Dr. Tamala Julian her husband had a stroke. Hasn't been able to do the exercises.  Patient states that she has has not had time for herself.  Has been starting the exercises over the last week and is doing a little better.      Past Medical History:  Diagnosis Date  . Abnormal chest x-ray    CXR c/w COPD. Full PFTs '11 - normal  . Allergic rhinitis, cause unspecified   . Allergy    seasonal  . Anxiety   . Arthritis    both knees, post traumatic - neck  . Cataract    removed bilateral  . Depression    with anxiety.  . Depression   . Dyslipidemia   . Hx of colonic polyps 2008  . IBS (irritable bowel syndrome)   . 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 Migraines    during teenage years  . MVP (mitral valve prolapse)    last 2D echo approx '10  . Osteopenia    DEXA 03/2012: -1.0   Past Surgical History:  Procedure Laterality Date  . APPENDECTOMY    . BUNIONECTOMY WITH HAMMERTOE RECONSTRUCTION Left 07/06/13   [facility]  . CARDIAC CATHETERIZATION  2006   normal study  . CARPAL TUNNEL RELEASE  2011   right hand  . HYSTEROPLASTY REPAIR OF UTERINE ANOMALY     2003 - laproscopically; 2006 - laparotomy  . LAPAROSCOPIC ENDOMETRIOSIS FULGURATION     1996 and 2002  . MANDIBLE RECONSTRUCTION    . NASAL SEPTUM SURGERY  11/2012   [name]  . TRIGGER FINGER RELEASE  2011   R thimb and ring finger  . TRIGGER FINGER RELEASE  02/04/2012   [place]ramig, in office   Social History   Socioeconomic History  . Marital status: Married    Spouse name: Not on file  . Number of children: 0  . Years of  education: 22  . Highest education level: Not on file  Occupational History  . 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 Financial resource strain: Not on file  . Food insecurity:    Worry: Not on file    Inability: Not on file  . Transportation needs:    Medical: Not on file    Non-medical: Not on file  Tobacco Use  . Smoking status: Never Smoker  . Smokeless tobacco: Never Used  Substance and Sexual Activity  . Alcohol use: Yes    Comment: social  . Drug use: No  . Sexual activity: Yes    Partners: Male  Lifestyle  . Physical activity:    Days per week: Not on file    Minutes per session: Not on file  . Stress: Not on file  Relationships  . Social connections:    Talks on phone: Not on file    Gets together: Not on file    Attends religious service: Not on file    Active member of club or organization: Not on file    Attends meetings of clubs or organizations:  Not on file    Relationship status: Not on file  Other Topics Concern  . Not on file  Social History Narrative   Graduated from [employer] with degree in Food science/nutrition.   Married '94.   Denies any physical or sexual abuse.   Family in [place]: parents and sister.   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   Has smoke alarms, wears seatbelt at all times, uses helmut when appropriate, firearms present in the home, uses herbal remedies, caffeine- 2-3 per day.   No regular exercise.   Allergies  Allergen Reactions  . Codeine Other (See Comments)    Hot flashes/passed out  . Pollen Extract Other (See Comments)    drainage   Family History  Problem Relation Age of Onset  . Arthritis Mother        DOB 65  . Cancer Mother        Thyroid cancer  . Hypertension Mother   . Atrial fibrillation Mother   . Melanoma Mother   . Breast cancer Mother   . Arthritis Father        DOB 1929  . Melanoma Father   . Colon polyps Neg Hx   . Rectal cancer Neg Hx   .  Stomach cancer Neg Hx       Current Outpatient Medications (Respiratory):  .  fluticasone (FLONASE) 50 MCG/ACT nasal spray, Place 2 sprays into both nostrils daily.    Current Outpatient Medications (Other):  Marland Kitchen  Diclofenac Sodium 2 % SOLN, Place 2 g onto the skin 2 (two) times daily. Marland Kitchen  DIGESTIVE ENZYMES PO, Take 1 tablet by mouth every other day. .  L-GLUTAMINE PO, Take 1 tablet by mouth 2 (two) times a week. .  metroNIDAZOLE (METROCREAM) 0.75 % cream,  .  Multiple Vitamin (MULTIVITAMIN WITH MINERALS) TABS tablet, Take 1 tablet by mouth daily. Marland Kitchen  omeprazole (PRILOSEC) 40 MG capsule, TAKE 1 CAPSULE (40 MG TOTAL) BY MOUTH DAILY. Marland Kitchen  sertraline (ZOLOFT) 50 MG tablet, Take 1 & 1/2 tablets by mouth once daily .  simethicone (MYLICON) 80 MG chewable tablet, Chew 80 mg by mouth every 6 (six) hours as needed for flatulence.    Past medical history, social, surgical and family history all reviewed in electronic medical record.  No pertanent information unless stated regarding to the chief complaint.   Review of Systems:  No headache, visual changes, nausea, vomiting, diarrhea, constipation, dizziness, abdominal pain, skin rash, fevers, chills, night sweats, weight loss, swollen lymph nodes, body aches, joint swelling, chest pain, shortness of breath, mood changes.  Positive muscle aches  Objective  Blood pressure 100/60, pulse 67, height 5' 7.5" (1.715 m), weight 138 lb (62.6 kg), SpO2 98 %.    General: No apparent distress alert and oriented x3 mood and affect normal, dressed appropriately.  HEENT: Pupils equal, extraocular movements intact  Respiratory: Patient's speak in full sentences and does not appear short of breath  Cardiovascular: No lower extremity edema, non tender, no erythema  Skin: Warm dry intact with no signs of infection or rash on extremities or on axial skeleton.  Abdomen: Soft nontender  Neuro: Cranial nerves II through XII are intact, neurovascularly intact in all  extremities with 2+ DTRs and 2+ pulses.  Lymph: No lymphadenopathy of posterior or anterior cervical chain or axillae bilaterally.  Gait normal with good balance and coordination.  MSK:  Non tender with full range of motion and good stability and symmetric strength and tone of shoulders,  elbows, wrist, hip, and ankles bilaterally.  Knee: Bilateral Normal to inspection with no erythema or effusion or obvious bony abnormalities. Palpation with mild discomfort of the patellofemoral joint ROM full in flexion and extension and lower leg rotation. Ligaments with solid consistent endpoints including ACL, PCL, LCL, MCL. Negative Mcmurray's, Apley's, and Thessalonian tests. painful patellar compression. Patellar glide with mild crepitus. Patellar and quadriceps tendons unremarkable. Hamstring and quadriceps strength is normal.    Impression and Recommendations:     This case required medical decision making of moderate complexity. The above documentation has been reviewed and is accurate and complete Lyndal Pulley, DO       Note: This dictation was prepared with Dragon dictation along with smaller phrase technology. Any transcriptional errors that result from this process are unintentional.

## 2018-03-16 NOTE — Patient Instructions (Signed)
Good to see you  PT will call you  Ice 20 minutes 2 times daily. Usually after activity and before bed. Exercises 3 times a week.  Try the brace with a lot of activity  Continue  more biking or elliptical if possible  See me again in 4-6 weeks to make sure you are doing well

## 2018-03-18 ENCOUNTER — Encounter: Payer: Self-pay | Admitting: Physical Therapy

## 2018-03-18 ENCOUNTER — Ambulatory Visit: Payer: Federal, State, Local not specified - PPO | Attending: Family Medicine | Admitting: Physical Therapy

## 2018-03-18 DIAGNOSIS — M25561 Pain in right knee: Secondary | ICD-10-CM | POA: Insufficient documentation

## 2018-03-18 DIAGNOSIS — G8929 Other chronic pain: Secondary | ICD-10-CM | POA: Diagnosis not present

## 2018-03-18 DIAGNOSIS — M222X1 Patellofemoral disorders, right knee: Secondary | ICD-10-CM | POA: Diagnosis not present

## 2018-03-18 DIAGNOSIS — M25562 Pain in left knee: Secondary | ICD-10-CM | POA: Insufficient documentation

## 2018-03-18 DIAGNOSIS — M222X2 Patellofemoral disorders, left knee: Secondary | ICD-10-CM | POA: Diagnosis not present

## 2018-03-18 NOTE — Patient Instructions (Signed)
Access Code: [email or web address]  URL: [email or web address]  Date: 03/18/2018  Prepared by: Elsie Ra   Exercises  Supine ITB Stretch with Strap - 3 sets - 30 hold - 2x daily - 6x weekly  Prone Quadriceps Stretch with Strap - 3 reps - 30 hold - 2x daily - 6x weekly  Supine Active Straight Leg Raise - 10 reps - 1-3 sets - 2x daily - 6x weekly  Sidelying Hip Abduction - 10 reps - 3 sets - 2x daily - 6x weekly  Prone Hip Extension - 10 reps - 3 sets - 2x daily - 6x weekly  Seated Hip Adduction Isometrics with Ball - 10 reps - 1-2 sets - 5 hold - 2x daily - 6x weekly  Seated Long Arc Quad - 10 reps - 2-3 sets - 2x daily - 6x weekly  ICE 2 times per day
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
## 2018-03-25 ENCOUNTER — Ambulatory Visit: Payer: Federal, State, Local not specified - PPO | Admitting: Physical Therapy

## 2018-03-25 DIAGNOSIS — M25561 Pain in right knee: Secondary | ICD-10-CM | POA: Diagnosis not present

## 2018-03-25 DIAGNOSIS — G8929 Other chronic pain: Secondary | ICD-10-CM | POA: Diagnosis not present

## 2018-03-25 DIAGNOSIS — M222X1 Patellofemoral disorders, right knee: Secondary | ICD-10-CM | POA: Diagnosis not present

## 2018-03-25 DIAGNOSIS — M222X2 Patellofemoral disorders, left knee: Principal | ICD-10-CM

## 2018-03-25 DIAGNOSIS — M25562 Pain in left knee: Secondary | ICD-10-CM

## 2018-03-25 NOTE — Therapy (Signed)
[place], [place], [address] Phone: [phone]   Fax:  [phone]  Physical Therapy Treatment  Patient Details  Name: Renee [name] MRN: [id number] Date of Birth: 1960-12-24 Referring Provider: Hulan Saas, DO   Encounter Date: 03/25/2018  PT End of Session - 03/25/18 1247    Visit Number  2    Number of Visits  10    Date for PT Re-Evaluation  04/29/18    Authorization Type  BCBS    PT Start Time  1110   pt late   PT Stop Time  1150    PT Time Calculation (min)  40 min    Activity Tolerance  Patient tolerated treatment well    Behavior During Therapy  [facility] for tasks assessed/performed       Past Medical History:  Diagnosis Date  . Abnormal chest x-ray    CXR c/w COPD. Full PFTs '11 - normal  . Allergic rhinitis, cause unspecified   . Allergy    seasonal  . Anxiety   . Arthritis    both knees, post traumatic - neck  . Cataract    removed bilateral  . Depression    with anxiety.  . Depression   . Dyslipidemia   . Hx of colonic polyps 2008  . IBS (irritable bowel syndrome)   . 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 Migraines    during teenage years  . MVP (mitral valve prolapse)    last 2D echo approx '10  . Osteopenia    DEXA 03/2012: -1.0    Past Surgical History:  Procedure Laterality Date  . APPENDECTOMY    . BUNIONECTOMY WITH HAMMERTOE RECONSTRUCTION Left 07/06/13   [facility]  . CARDIAC CATHETERIZATION  2006   normal study  . CARPAL TUNNEL RELEASE  2011   right hand  . HYSTEROPLASTY REPAIR OF UTERINE ANOMALY     2003 - laproscopically; 2006 - laparotomy  . LAPAROSCOPIC ENDOMETRIOSIS FULGURATION     1996 and 2002  . MANDIBLE RECONSTRUCTION    . NASAL SEPTUM SURGERY  11/2012   [name]  . TRIGGER FINGER RELEASE  2011   R thimb and ring finger  . TRIGGER FINGER RELEASE  02/04/2012   Gramig, in office    There were no vitals filed for this  visit.  Subjective Assessment - 03/25/18 1138    Subjective  Pt relays the KT tape and ice really helps, she has less pain and crepitus today. She relays compliance with HEP thus far.    Currently in Pain?  Yes    Pain Score  2     Pain Location  Knee    Pain Orientation  Right;Left   Rt lat, Lt medial   Pain Descriptors / Indicators  Aching                       [facility] Adult PT Treatment/Exercise - 03/25/18 0001      Exercises   Exercises  Knee/Hip      Knee/Hip Exercises: Stretches   Passive Hamstring Stretch  Both;2 reps;30 seconds    Quad Stretch  Right;2 reps;30 seconds    ITB Stretch  Right;2 reps;30 seconds      Knee/Hip Exercises: Seated   Long Arc Quad  Both;15 reps   with ball SQ     Knee/Hip Exercises: Prone   Hip Extension  10 reps;Both  Modalities   Modalities  Ultrasound      Ultrasound   Ultrasound Location  bilat knee 5 min ea    Ultrasound Parameters  100%, 1.2 w/cm, 3 mhz    Ultrasound Goals  Pain           KT tape asterisk pattern over bilat patellas             PT Education - 03/25/18 1247    Education Details  KT tape instuctions for self application for home    Person(s) Educated  Patient    Methods  Explanation;Tactile cues;Verbal cues    Comprehension  Verbalized understanding          PT Long Term Goals - 03/25/18 1250      PT LONG TERM GOAL #1   Title  Pt will be I and compliant with HEP. 6 weeks 04/29/18    Status  On-going      PT LONG TERM GOAL #2   Title  Pt will be able to go up down stairs and walk her dogs with less than 2-3/10 pain. 6 weeks 04/29/18    Status  On-going      PT LONG TERM GOAL #3   Title  Pt will improve hip strength to 5/5 MMT. 6 weeks 04/29/18    Status  On-going            Plan - 03/25/18 1248    Clinical Impression Statement  Pt had less pain and less crepetis today and is responding well to PT. KT tape again resumed as she relays positive return and she was shown  how to apply herself at home. She was trialed with U.S. today to further reduce pain and inflammaiton.    Rehab Potential  Good    PT Frequency  2x / week    PT Duration  6 weeks    PT Treatment/Interventions  Cryotherapy;Electrical Stimulation;Iontophoresis 4mg /ml Dexamethasone;Moist Heat;Ultrasound;Therapeutic exercise;Neuromuscular re-education;Manual techniques;Passive range of motion;Dry needling;Taping;Vasopneumatic Device;Joint Manipulations    PT Next Visit Plan  , assess response to U.S, consider modalites U.S or IONTO or TENS, knee and hip stretching and strength.     Consulted and Agree with Plan of Care  Patient       Patient will benefit from skilled therapeutic intervention in order to improve the following deficits and impairments:  Decreased activity tolerance, Decreased range of motion, Decreased strength, Hypomobility, Difficulty walking, Impaired flexibility, Pain  Visit Diagnosis: Patellofemoral syndrome of both knees  Chronic pain of both knees     Problem List Patient Active Problem List   Diagnosis Date Noted  . Patellofemoral syndrome of both knees 01/15/2018  . Chronic pain of both knees 12/19/2017  . Family history of thyroid cancer 12/19/2017  . Cough 08/12/2017  . Greater trochanteric bursitis of right hip 11/30/2016  . Gastroesophageal reflux disease 04/22/2016  . Loose stools 04/22/2016  . Routine general medical examination at a health care facility 03/16/2015  . Dyslipidemia 04/09/2013  . Osteopenia 04/06/2012  . Postmenopausal symptoms 02/12/2012  . Depression 11/11/2011  . Trigger finger of both hands   . IBS (irritable bowel syndrome)   . Abnormal chest x-ray     Debbe Odea, PT, DPT 03/25/2018, 12:52 PM  [facility] [address] [place], [place], [address] Phone: [phone]   Fax:  [phone]  Name: Maude Hettich MRN: [id number] Date of Birth: [birth year]

## 2018-03-26 ENCOUNTER — Ambulatory Visit: Payer: Federal, State, Local not specified - PPO | Admitting: Physical Therapy

## 2018-03-26 DIAGNOSIS — M222X2 Patellofemoral disorders, left knee: Principal | ICD-10-CM

## 2018-03-26 DIAGNOSIS — M222X1 Patellofemoral disorders, right knee: Secondary | ICD-10-CM

## 2018-03-26 DIAGNOSIS — M25562 Pain in left knee: Secondary | ICD-10-CM

## 2018-03-26 DIAGNOSIS — G8929 Other chronic pain: Secondary | ICD-10-CM | POA: Diagnosis not present

## 2018-03-26 DIAGNOSIS — M25561 Pain in right knee: Secondary | ICD-10-CM | POA: Diagnosis not present

## 2018-03-26 NOTE — Therapy (Signed)
[place], [place], [address] Phone: [phone]   Fax:  [phone]  Physical Therapy Treatment  Patient Details  Name: Renee [name] MRN: [id number] Date of Birth: 1961/05/13 Referring Provider (PT): Hulan Saas, DO   Encounter Date: 03/26/2018  PT End of Session - 03/26/18 2032    Visit Number  3    Number of Visits  10    Date for PT Re-Evaluation  04/29/18    Authorization Type  BCBS    PT Start Time  9211    PT Stop Time  1510    PT Time Calculation (min)  50 min    Activity Tolerance  Patient tolerated treatment well    Behavior During Therapy  [facility] for tasks assessed/performed       Past Medical History:  Diagnosis Date  . Abnormal chest x-ray    CXR c/w COPD. Full PFTs '11 - normal  . Allergic rhinitis, cause unspecified   . Allergy    seasonal  . Anxiety   . Arthritis    both knees, post traumatic - neck  . Cataract    removed bilateral  . Depression    with anxiety.  . Depression   . Dyslipidemia   . Hx of colonic polyps 2008  . IBS (irritable bowel syndrome)   . 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 Migraines    during teenage years  . MVP (mitral valve prolapse)    last 2D echo approx '10  . Osteopenia    DEXA 03/2012: -1.0    Past Surgical History:  Procedure Laterality Date  . APPENDECTOMY    . BUNIONECTOMY WITH HAMMERTOE RECONSTRUCTION Left 07/06/13   [facility]  . CARDIAC CATHETERIZATION  2006   normal study  . CARPAL TUNNEL RELEASE  2011   right hand  . HYSTEROPLASTY REPAIR OF UTERINE ANOMALY     2003 - laproscopically; 2006 - laparotomy  . LAPAROSCOPIC ENDOMETRIOSIS FULGURATION     1996 and 2002  . MANDIBLE RECONSTRUCTION    . NASAL SEPTUM SURGERY  11/2012   [name]  . TRIGGER FINGER RELEASE  2011   R thimb and ring finger  . TRIGGER FINGER RELEASE  02/04/2012   Gramig, in office    There were no vitals filed for this  visit.  Subjective Assessment - 03/26/18 2025    Subjective  Pt relays knee cap feels a little achy but she is no longer having the lateral pain on Rt knee, or the medial pain in Lt knee.    Currently in Pain?  Yes    Pain Score  2     Pain Location  Knee    Pain Orientation  Right;Left    Pain Descriptors / Indicators  Aching    Pain Type  Chronic pain                       [facility] Adult PT Treatment/Exercise - 03/26/18 2028      Exercises   Exercises  Knee/Hip      Knee/Hip Exercises: Stretches   Passive Hamstring Stretch  Both;2 reps;30 seconds    Quad Stretch  Right;2 reps;30 seconds    ITB Stretch  Right;2 reps;30 seconds      Knee/Hip Exercises: Seated   Long Arc Quad  Both;15 reps   with ball SQ     Knee/Hip Exercises: Prone   Hip Extension  10  reps;Both      Modalities   Modalities  Ultrasound      Cryotherapy   Number Minutes Cryotherapy  --   pt declined, will ice at home     Ultrasound   Ultrasound Location  bilat knee 5 min ea    Ultrasound Parameters  100%, 1.5 w/cm, 3.69mhz    Ultrasound Goals  Pain      Manual Therapy   Manual therapy comments  KT tape asterisk pattern over bilat patellas             PT Education - 03/26/18 2030    Education Details  again reviewed KT tape for self application at home    Person(s) Educated  Patient    Methods  Explanation;Tactile cues;Demonstration;Verbal cues    Comprehension  Verbalized understanding          PT Long Term Goals - 03/25/18 1250      PT LONG TERM GOAL #1   Title  Pt will be I and compliant with HEP. 6 weeks 04/29/18    Status  On-going      PT LONG TERM GOAL #2   Title  Pt will be able to go up down stairs and walk her dogs with less than 2-3/10 pain. 6 weeks 04/29/18    Status  On-going      PT LONG TERM GOAL #3   Title  Pt will improve hip strength to 5/5 MMT. 6 weeks 04/29/18    Status  On-going            Plan - 03/26/18 2033    Clinical Impression  Statement  Pt is progressing well with PT with strength, ROM, and decreased pain. KT tape and U.S was resumed as she feels this is improving her symptoms. She will miss next week due to vacation and PT will continue POC when she returns.    Rehab Potential  Good    PT Frequency  2x / week    PT Duration  6 weeks    PT Treatment/Interventions  Cryotherapy;Electrical Stimulation;Iontophoresis 4mg /ml Dexamethasone;Moist Heat;Ultrasound;Therapeutic exercise;Neuromuscular re-education;Manual techniques;Passive range of motion;Dry needling;Taping;Vasopneumatic Device;Joint Manipulations    PT Next Visit Plan   consider modalites U.S or IONTO or TENS, knee and hip stretching and strength.     Consulted and Agree with Plan of Care  Patient       Patient will benefit from skilled therapeutic intervention in order to improve the following deficits and impairments:  Decreased activity tolerance, Decreased range of motion, Decreased strength, Hypomobility, Difficulty walking, Impaired flexibility, Pain  Visit Diagnosis: Patellofemoral syndrome of both knees  Chronic pain of both knees     Problem List Patient Active Problem List   Diagnosis Date Noted  . Patellofemoral syndrome of both knees 01/15/2018  . Chronic pain of both knees 12/19/2017  . Family history of thyroid cancer 12/19/2017  . Cough 08/12/2017  . Greater trochanteric bursitis of right hip 11/30/2016  . Gastroesophageal reflux disease 04/22/2016  . Loose stools 04/22/2016  . Routine general medical examination at a health care facility 03/16/2015  . Dyslipidemia 04/09/2013  . Osteopenia 04/06/2012  . Postmenopausal symptoms 02/12/2012  . Depression 11/11/2011  . Trigger finger of both hands   . IBS (irritable bowel syndrome)   . Abnormal chest x-ray     Debbe Odea, PT, DPT 03/26/2018, 8:43 PM  [facility] [address] [place], [place], [address] Phone: [phone]    Fax:  [phone]  Name: Renee [name] MRN: [id number] Date of Birth: 07-Jun-1961

## 2018-04-06 ENCOUNTER — Other Ambulatory Visit: Payer: Self-pay

## 2018-04-06 ENCOUNTER — Ambulatory Visit: Payer: Federal, State, Local not specified - PPO | Attending: Family Medicine | Admitting: Physical Therapy

## 2018-04-06 DIAGNOSIS — G8929 Other chronic pain: Secondary | ICD-10-CM | POA: Diagnosis not present

## 2018-04-06 DIAGNOSIS — M222X1 Patellofemoral disorders, right knee: Secondary | ICD-10-CM | POA: Diagnosis not present

## 2018-04-06 DIAGNOSIS — M25561 Pain in right knee: Secondary | ICD-10-CM | POA: Diagnosis not present

## 2018-04-06 DIAGNOSIS — M222X2 Patellofemoral disorders, left knee: Secondary | ICD-10-CM | POA: Insufficient documentation

## 2018-04-06 DIAGNOSIS — M25562 Pain in left knee: Secondary | ICD-10-CM | POA: Diagnosis not present

## 2018-04-06 NOTE — Therapy (Signed)
[place], [place], [address] Phone: [phone]   Fax:  [phone]  Physical Therapy Treatment  Patient Details  Name: Renee [name] MRN: [id number] Date of Birth: October 21, 1960 Referring Provider (PT): Hulan Saas, DO   Encounter Date: 04/06/2018  PT End of Session - 04/06/18 1155    Visit Number  4    Number of Visits  10    Date for PT Re-Evaluation  04/29/18    Authorization Type  BCBS    PT Start Time  1106    PT Stop Time  1147    PT Time Calculation (min)  41 min    Activity Tolerance  Patient tolerated treatment well    Behavior During Therapy  [facility] for tasks assessed/performed       Past Medical History:  Diagnosis Date  . Abnormal chest x-ray    CXR c/w COPD. Full PFTs '11 - normal  . Allergic rhinitis, cause unspecified   . Allergy    seasonal  . Anxiety   . Arthritis    both knees, post traumatic - neck  . Cataract    removed bilateral  . Depression    with anxiety.  . Depression   . Dyslipidemia   . Hx of colonic polyps 2008  . IBS (irritable bowel syndrome)   . 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 Migraines    during teenage years  . MVP (mitral valve prolapse)    last 2D echo approx '10  . Osteopenia    DEXA 03/2012: -1.0    Past Surgical History:  Procedure Laterality Date  . APPENDECTOMY    . BUNIONECTOMY WITH HAMMERTOE RECONSTRUCTION Left 07/06/13   [facility]  . CARDIAC CATHETERIZATION  2006   normal study  . CARPAL TUNNEL RELEASE  2011   right hand  . HYSTEROPLASTY REPAIR OF UTERINE ANOMALY     2003 - laproscopically; 2006 - laparotomy  . LAPAROSCOPIC ENDOMETRIOSIS FULGURATION     1996 and 2002  . MANDIBLE RECONSTRUCTION    . NASAL SEPTUM SURGERY  11/2012   [name]  . TRIGGER FINGER RELEASE  2011   R thimb and ring finger  . TRIGGER FINGER RELEASE  02/04/2012   Gramig, in office    There were no vitals filed for this  visit.  Subjective Assessment - 04/06/18 1109    Subjective  Pt. rates bilateral knee pain 4/10 this AM. Pt. just returned from vacation, limited exercises and notes some exacerbation from walking including on uneven surfaces on beach.    Limitations  Standing;Walking    How long can you stand comfortably?  depends    How long can you walk comfortably?  depends    Currently in Pain?  Yes    Pain Score  4     Pain Location  Knee    Pain Orientation  Right;Left    Pain Descriptors / Indicators  Aching    Pain Type  Chronic pain    Pain Frequency  Intermittent    Aggravating Factors   initial sit>stand, prolonged standing or walking    Pain Relieving Factors  Mild relief Pennsaid ointment as well as taping         [facility] PT Assessment - 04/06/18 0001      Strength   Overall Strength Comments  Knee ext/flex 5/5 bilat., knee flex 5/5 bilat., ext 4+/5 bilat., abd 4+/5 bilat. ER 4+/5 bilat., IR 5/5 bilat.  [facility] Adult PT Treatment/Exercise - 04/06/18 0001      Exercises   Exercises  Knee/Hip      Knee/Hip Exercises: Stretches   ITB Stretch  3 reps;30 seconds;Both   supine manual stretch     Knee/Hip Exercises: Aerobic   Stationary Bike  L1 x 6 min      Knee/Hip Exercises: Standing   Hip Flexion  Both;1 set;10 reps   front SLR   Hip Flexion Limitations  red Theraband    Hip Abduction  Both;1 set;10 reps    Abduction Limitations  red Theraband    Hip Extension  Both;1 set;10 reps    Extension Limitations  red Theraband    Lateral Step Up  1 set;10 reps;Both    Lateral Step Up Limitations  2 in. lateral eccentric stepdown    Functional Squat  15 reps    Functional Squat Limitations  partial squat with UE support on parallel bars      Knee/Hip Exercises: Seated   Long Arc Quad  Both;2 sets;10 reps    Long Arc Quad Weight  3 lbs.      Knee/Hip Exercises: Supine   Bridges  2 sets;10 reps    Straight Leg Raises  Both;2 sets;10 reps    Other  Supine Knee/Hip Exercises  supine clam green Theraband 2x10             PT Education - 04/06/18 1155    Education Details  Knee anatomy for patellofemoral mechanics, HEP updates    Person(s) Educated  Patient    Methods  Explanation;Demonstration;Verbal cues;Handout    Comprehension  Verbalized understanding;Returned demonstration          PT Long Term Goals - 04/06/18 1156      PT LONG TERM GOAL #1   Title  Pt will be I and compliant with HEP. 6 weeks 04/29/18    Baseline  updated today    Status  On-going      PT LONG TERM GOAL #2   Title  Pt will be able to go up down stairs and walk her dogs with less than 2-3/10 pain. 6 weeks 04/29/18    Status  On-going      PT LONG TERM GOAL #3   Title  Pt will improve hip strength to 5/5 MMT. 6 weeks 04/29/18    Status  On-going            Plan - 04/06/18 1159    Clinical Impression Statement  Fair progress with therapy to date with mild setback after travel out of town. Pt. continues with hip weakness and closed-kinetic chain quad weakness with compromised patellofemoral mechanics contrbuting to pain. Pt. would benefit from continued PT for further progress to address remaining functional limitations.    Clinical Presentation  Stable    Clinical Decision Making  Low    Rehab Potential  Good    PT Frequency  2x / week    PT Duration  6 weeks    PT Treatment/Interventions  Cryotherapy;Electrical Stimulation;Iontophoresis 4mg /ml Dexamethasone;Moist Heat;Ultrasound;Therapeutic exercise;Neuromuscular re-education;Manual techniques;Passive range of motion;Dry needling;Taping;Vasopneumatic Device;Joint Manipulations    PT Next Visit Plan  Continue emphasis hip and quadricep strengthening, stretches, continue/progress closed chain activities as tolerated    Consulted and Agree with Plan of Care  Patient       Patient will benefit from skilled therapeutic intervention in order to improve the following deficits and impairments:   Decreased activity tolerance, Decreased range of motion, Decreased strength, Hypomobility,  Difficulty walking, Impaired flexibility, Pain  Visit Diagnosis: Patellofemoral syndrome of both knees  Chronic pain of both knees     Problem List Patient Active Problem List   Diagnosis Date Noted  . Patellofemoral syndrome of both knees 01/15/2018  . Chronic pain of both knees 12/19/2017  . Family history of thyroid cancer 12/19/2017  . Cough 08/12/2017  . Greater trochanteric bursitis of right hip 11/30/2016  . Gastroesophageal reflux disease 04/22/2016  . Loose stools 04/22/2016  . Routine general medical examination at a health care facility 03/16/2015  . Dyslipidemia 04/09/2013  . Osteopenia 04/06/2012  . Postmenopausal symptoms 02/12/2012  . Depression 11/11/2011  . Trigger finger of both hands   . IBS (irritable bowel syndrome)   . Abnormal chest x-ray     [name], PT, DPT   04/06/2018, 12:01 PM  [facility] [address] [place], [place], [address] Phone: [phone]   Fax:  [phone]  Name: Renee [name] MRN: [id number] Date of Birth: 1960-09-01

## 2018-04-08 ENCOUNTER — Encounter: Payer: Federal, State, Local not specified - PPO | Admitting: Physical Therapy

## 2018-04-09 ENCOUNTER — Ambulatory Visit: Payer: Federal, State, Local not specified - PPO | Admitting: Physical Therapy

## 2018-04-09 ENCOUNTER — Ambulatory Visit (INDEPENDENT_AMBULATORY_CARE_PROVIDER_SITE_OTHER): Payer: Federal, State, Local not specified - PPO | Admitting: Internal Medicine

## 2018-04-09 ENCOUNTER — Encounter: Payer: Self-pay | Admitting: Internal Medicine

## 2018-04-09 ENCOUNTER — Other Ambulatory Visit (INDEPENDENT_AMBULATORY_CARE_PROVIDER_SITE_OTHER): Payer: Federal, State, Local not specified - PPO

## 2018-04-09 ENCOUNTER — Encounter: Payer: Self-pay | Admitting: Physical Therapy

## 2018-04-09 VITALS — BP 110/78 | HR 62 | Temp 98.2°F | Ht 67.5 in | Wt 140.0 lb

## 2018-04-09 DIAGNOSIS — K219 Gastro-esophageal reflux disease without esophagitis: Secondary | ICD-10-CM | POA: Diagnosis not present

## 2018-04-09 DIAGNOSIS — M222X2 Patellofemoral disorders, left knee: Secondary | ICD-10-CM | POA: Diagnosis not present

## 2018-04-09 DIAGNOSIS — Z808 Family history of malignant neoplasm of other organs or systems: Secondary | ICD-10-CM

## 2018-04-09 DIAGNOSIS — M25561 Pain in right knee: Secondary | ICD-10-CM

## 2018-04-09 DIAGNOSIS — Z0000 Encounter for general adult medical examination without abnormal findings: Secondary | ICD-10-CM

## 2018-04-09 DIAGNOSIS — G8929 Other chronic pain: Secondary | ICD-10-CM | POA: Diagnosis not present

## 2018-04-09 DIAGNOSIS — M222X1 Patellofemoral disorders, right knee: Secondary | ICD-10-CM

## 2018-04-09 DIAGNOSIS — M25562 Pain in left knee: Secondary | ICD-10-CM

## 2018-04-09 DIAGNOSIS — F3341 Major depressive disorder, recurrent, in partial remission: Secondary | ICD-10-CM | POA: Diagnosis not present

## 2018-04-09 LAB — T4, FREE: Free T4: 0.71 ng/dL (ref 0.60–1.60)

## 2018-04-09 LAB — COMPREHENSIVE METABOLIC PANEL
ALT: 13 U/L (ref 0–35)
AST: 15 U/L (ref 0–37)
Albumin: 4.4 g/dL (ref 3.5–5.2)
Alkaline Phosphatase: 61 U/L (ref 39–117)
BUN: 18 mg/dL (ref 6–23)
CO2: 31 meq/L (ref 19–32)
Calcium: 9.9 mg/dL (ref 8.4–10.5)
Chloride: 101 mEq/L (ref 96–112)
Creatinine, Ser: 0.85 mg/dL (ref 0.40–1.20)
GFR: 73.32 mL/min (ref >60.00)
GLUCOSE: 85 mg/dL (ref 70–99)
POTASSIUM: 4.6 meq/L (ref 3.5–5.1)
Sodium: 139 mEq/L (ref 135–145)
Total Bilirubin: 0.7 mg/dL (ref 0.2–1.2)
Total Protein: 7.5 g/dL (ref 6.0–8.3)

## 2018-04-09 LAB — LIPID PANEL
CHOL/HDL RATIO: 3
Cholesterol: 202 mg/dL — ABNORMAL HIGH (ref 0–200)
HDL: 74.2 mg/dL (ref >39.00)
LDL Cholesterol: 118 mg/dL — ABNORMAL HIGH (ref 0–99)
NonHDL: 127.4
Triglycerides: 46 mg/dL (ref 0.0–149.0)
VLDL: 9.2 mg/dL (ref 0.0–40.0)

## 2018-04-09 LAB — CBC
HCT: 40.1 % (ref 36.0–46.0)
Hemoglobin: 13.4 g/dL (ref 12.0–15.0)
MCHC: 33.3 g/dL (ref 30.0–36.0)
MCV: 89.2 fl (ref 78.0–100.0)
PLATELETS: 197 10*3/uL (ref 150.0–400.0)
RBC: 4.5 Mil/uL (ref 3.87–5.11)
RDW: 13.3 % (ref 11.5–15.5)
WBC: 4.4 10*3/uL (ref 4.0–10.5)

## 2018-04-09 LAB — TSH: TSH: 2.3 u[IU]/mL (ref 0.35–4.50)

## 2018-04-09 MED ORDER — FLUTICASONE PROPIONATE 50 MCG/ACT NA SUSP: 2.0000 | Freq: Every day | NASAL | 3 refills | Status: DC

## 2018-04-09 MED ORDER — SERTRALINE HCL 50 MG PO TABS
100.0000 mg | ORAL_TABLET | Freq: Every day | ORAL | 3 refills | Status: DC
Start: 2018-04-09 — End: 2019-04-19

## 2018-04-09 NOTE — Patient Instructions (Signed)

## 2018-04-09 NOTE — Therapy (Signed)
Renee [name], [place], [address] Phone: [phone]   Fax:  [phone]  Physical Therapy Treatment  Patient Details  Name: Renee [name] MRN: [id number] Date of Birth: September 05, 1960 Referring Provider (PT): Hulan Saas, DO   Encounter Date: 04/09/2018  PT End of Session - 04/09/18 1440    Visit Number  5    Number of Visits  12    Date for PT Re-Evaluation  04/29/18    Authorization Type  BCBS    PT Start Time  1421    PT Stop Time  1500    PT Time Calculation (min)  39 min    Activity Tolerance  Patient tolerated treatment well    Behavior During Therapy  Renee [name] for tasks assessed/performed       Past Medical History:  Diagnosis Date  . Abnormal chest x-ray    CXR c/w COPD. Full PFTs '11 - normal  . Allergic rhinitis, cause unspecified   . Allergy    seasonal  . Anxiety   . Arthritis    both knees, post traumatic - neck  . Cataract    removed bilateral  . Depression    with anxiety.  . Depression   . Dyslipidemia   . Hx of colonic polyps 2008  . IBS (irritable bowel syndrome)   . 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 Migraines    during teenage years  . MVP (mitral valve prolapse)    last 2D echo approx '10  . Osteopenia    DEXA 03/2012: -1.0    Past Surgical History:  Procedure Laterality Date  . APPENDECTOMY    . BUNIONECTOMY WITH HAMMERTOE RECONSTRUCTION Left 07/06/13   [facility]  . CARDIAC CATHETERIZATION  2006   normal study  . CARPAL TUNNEL RELEASE  2011   right hand  . HYSTEROPLASTY REPAIR OF UTERINE ANOMALY     2003 - laproscopically; 2006 - laparotomy  . LAPAROSCOPIC ENDOMETRIOSIS FULGURATION     1996 and 2002  . MANDIBLE RECONSTRUCTION    . NASAL SEPTUM SURGERY  11/2012   Renee [name]  . TRIGGER FINGER RELEASE  2011   R thimb and ring finger  . TRIGGER FINGER RELEASE  02/04/2012   Gramig, in office    There were no vitals filed for this  visit.  Subjective Assessment - 04/09/18 1428    Subjective  No significant soreness after last tx. 3/10 knee pain today. No new complaints or concerns otherwise.    Limitations  Standing;Walking    Pain Score  3     Pain Location  Knee    Pain Orientation  Right;Left    Pain Descriptors / Indicators  Aching    Pain Type  Chronic pain    Pain Onset  More than a month ago                       [facility] Adult PT Treatment/Exercise - 04/09/18 0001      Exercises   Exercises  Knee/Hip      Knee/Hip Exercises: Stretches   Passive Hamstring Stretch  Both;3 reps;30 seconds    Quad Stretch  Both;2 reps;30 seconds    ITB Stretch  Both;3 reps;30 seconds      Knee/Hip Exercises: Aerobic   Stationary Bike  L4 x 6 min      Knee/Hip Exercises: Standing   Hip Flexion  Both;1 set;10 reps  Hip Flexion Limitations  red Theraband    Hip Abduction  Both;1 set;10 reps    Abduction Limitations  red Theraband    Hip Extension  Both;10 reps;1 set    Extension Limitations  red Theraband    Lateral Step Up  Both;1 set;15 reps    Lateral Step Up Limitations  4 in. lateral eccentric stepdown    Functional Squat  2 sets;10 reps   partial squat Green band above knees at // bars   Other Standing Knee Exercises  Monster walf 15 feet x 4 with green band      Knee/Hip Exercises: Supine   Bridges  15 reps   legs on red P-ball   Other Supine Knee/Hip Exercises  supine alt. unilat. clam with green band 2x10             PT Education - 04/09/18 1440    Education Details  HEP    Person(s) Educated  Patient    Methods  Explanation    Comprehension  Verbalized understanding          PT Long Term Goals - 04/06/18 1156      PT LONG TERM GOAL #1   Title  Pt will be I and compliant with HEP. 6 weeks 04/29/18    Baseline  updated today    Status  On-going      PT LONG TERM GOAL #2   Title  Pt will be able to go up down stairs and walk her dogs with less than 2-3/10 pain. 6  weeks 04/29/18    Status  On-going      PT LONG TERM GOAL #3   Title  Pt will improve hip strength to 5/5 MMT. 6 weeks 04/29/18    Status  On-going            Plan - 04/09/18 1442    Clinical Impression Statement  Good tolerance for continued exercise/strengthening progression for hips and knees to improve patellofemoral mechanics. Expect progress will be gradual to address closed chain weakness but progressing from previous status.    Rehab Potential  Good    PT Frequency  2x / week    PT Duration  6 weeks    PT Treatment/Interventions  Cryotherapy;Electrical Stimulation;Iontophoresis 4mg /ml Dexamethasone;Moist Heat;Ultrasound;Therapeutic exercise;Neuromuscular re-education;Manual techniques;Passive range of motion;Dry needling;Taping;Vasopneumatic Device;Joint Manipulations    PT Next Visit Plan  Continue emphasis hip and quadricep strengthening, stretches, continue/progress closed chain activities as tolerated    Consulted and Agree with Plan of Care  Patient       Patient will benefit from skilled therapeutic intervention in order to improve the following deficits and impairments:  Decreased activity tolerance, Decreased range of motion, Decreased strength, Hypomobility, Difficulty walking, Impaired flexibility, Pain  Visit Diagnosis: Patellofemoral syndrome of both knees  Chronic pain of both knees     Problem List Patient Active Problem List   Diagnosis Date Noted  . Patellofemoral syndrome of both knees 01/15/2018  . Chronic pain of both knees 12/19/2017  . Family history of thyroid cancer 12/19/2017  . Cough 08/12/2017  . Greater trochanteric bursitis of right hip 11/30/2016  . Gastroesophageal reflux disease 04/22/2016  . Loose stools 04/22/2016  . Routine general medical examination at a health care facility 03/16/2015  . Dyslipidemia 04/09/2013  . Osteopenia 04/06/2012  . Postmenopausal symptoms 02/12/2012  . Depression 11/11/2011  . Trigger finger of both  hands   . IBS (irritable bowel syndrome)   . Abnormal chest x-ray  [name], PT, DPT 04/09/18 3:06 PM  [facility] [address] [place], [place], [address] Phone: [phone]   Fax:  [phone]  Name: Renee [name] MRN: [id number] Date of Birth: [birth year]

## 2018-04-09 NOTE — Progress Notes (Signed)
   Subjective:    Patient ID: Renee [name], female    DOB: 12-Jun-1961, 57 y.o.   MRN: [id number]  HPI The patient is a 58 YO female coming in for physical.   PMH, [place], social history reviewed and updated  Review of Systems  Constitutional: Negative.   HENT: Negative.   Eyes: Negative.   Respiratory: Negative for cough, chest tightness and shortness of breath.   Cardiovascular: Negative for chest pain, palpitations and leg swelling.  Gastrointestinal: Negative for abdominal distention, abdominal pain, constipation, diarrhea, nausea and vomiting.  Musculoskeletal: Positive for arthralgias.  Skin: Negative.   Neurological: Negative.   Psychiatric/Behavioral: Negative.       Objective:   Physical Exam  Constitutional: She is oriented to person, place, and time. She appears well-developed and well-nourished.  HENT:  Head: Normocephalic and atraumatic.  Eyes: EOM are normal.  Neck: Normal range of motion.  Cardiovascular: Normal rate and regular rhythm.  Pulmonary/Chest: Effort normal and breath sounds normal. No respiratory distress. She has no wheezes. She has no rales.  Abdominal: Soft. Bowel sounds are normal. She exhibits no distension. There is no tenderness. There is no rebound.  Musculoskeletal: She exhibits no edema.  Neurological: She is alert and oriented to person, place, and time. Coordination normal.  Skin: Skin is warm and dry.  Psychiatric: She has a normal mood and affect.   Vitals:   04/09/18 1010  BP: 110/78  Pulse: 62  Temp: 98.2 F (36.8 C)  TempSrc: Oral  SpO2: 98%  Weight: 140 lb (63.5 kg)  Height: 5' 7.5" (1.715 m)      Assessment & Plan:

## 2018-04-10 ENCOUNTER — Encounter: Payer: Self-pay | Admitting: Internal Medicine

## 2018-04-10 NOTE — Assessment & Plan Note (Signed)
Checking TSH and free T4.

## 2018-04-10 NOTE — Assessment & Plan Note (Signed)
Increase zoloft back to 100 mg daily and flexibility to take 75 mg daily if symptoms improve.

## 2018-04-10 NOTE — Assessment & Plan Note (Signed)
Flu shot declines. Shingrix declines. Tetanus up to date. Colonoscopy up to date. Mammogram up to date, pap smear up to date. Counseled about sun safety and mole surveillance. Counseled about the dangers of distracted driving. Given 10 year screening recommendations.   

## 2018-04-10 NOTE — Assessment & Plan Note (Signed)
Controlled with omeprazole and continue.

## 2018-04-13 ENCOUNTER — Ambulatory Visit: Payer: Federal, State, Local not specified - PPO | Admitting: Physical Therapy

## 2018-04-13 ENCOUNTER — Encounter: Payer: Self-pay | Admitting: Physical Therapy

## 2018-04-13 DIAGNOSIS — M25561 Pain in right knee: Secondary | ICD-10-CM | POA: Diagnosis not present

## 2018-04-13 DIAGNOSIS — G8929 Other chronic pain: Secondary | ICD-10-CM | POA: Diagnosis not present

## 2018-04-13 DIAGNOSIS — M222X1 Patellofemoral disorders, right knee: Secondary | ICD-10-CM

## 2018-04-13 DIAGNOSIS — M222X2 Patellofemoral disorders, left knee: Principal | ICD-10-CM

## 2018-04-13 DIAGNOSIS — M25562 Pain in left knee: Secondary | ICD-10-CM | POA: Diagnosis not present

## 2018-04-13 NOTE — Therapy (Signed)
[place], [place], [address] Phone: [phone]   Fax:  [phone]  Physical Therapy Treatment  Patient Details  Name: Renee [name] MRN: [id number] Date of Birth: 1961/06/04 Referring Provider (PT): Hulan Saas, DO   Encounter Date: 04/13/2018  PT End of Session - 04/13/18 1409    Visit Number  6    Number of Visits  12    Date for PT Re-Evaluation  04/29/18    Authorization Type  BCBS    PT Start Time  3295    PT Stop Time  1408    PT Time Calculation (min)  40 min    Activity Tolerance  Patient tolerated treatment well    Behavior During Therapy  [facility] for tasks assessed/performed       Past Medical History:  Diagnosis Date  . Abnormal chest x-ray    CXR c/w COPD. Full PFTs '11 - normal  . Allergic rhinitis, cause unspecified   . Allergy    seasonal  . Anxiety   . Arthritis    both knees, post traumatic - neck  . Cataract    removed bilateral  . Depression    with anxiety.  . Depression   . Dyslipidemia   . Hx of colonic polyps 2008  . IBS (irritable bowel syndrome)   . 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 Migraines    during teenage years  . MVP (mitral valve prolapse)    last 2D echo approx '10  . Osteopenia    DEXA 03/2012: -1.0    Past Surgical History:  Procedure Laterality Date  . APPENDECTOMY    . BUNIONECTOMY WITH HAMMERTOE RECONSTRUCTION Left 07/06/13   [facility]  . CARDIAC CATHETERIZATION  2006   normal study  . CARPAL TUNNEL RELEASE  2011   right hand  . HYSTEROPLASTY REPAIR OF UTERINE ANOMALY     2003 - laproscopically; 2006 - laparotomy  . LAPAROSCOPIC ENDOMETRIOSIS FULGURATION     1996 and 2002  . MANDIBLE RECONSTRUCTION    . NASAL SEPTUM SURGERY  11/2012   [name]  . TRIGGER FINGER RELEASE  2011   R thimb and ring finger  . TRIGGER FINGER RELEASE  02/04/2012   Gramig, in office    There were no vitals filed for this  visit.  Subjective Assessment - 04/13/18 1332    Subjective  Pt. reports knees were sore Friday evening and over the weekend after prolonged standing for cooking and moving some boxes.    Limitations  Standing;Walking    Currently in Pain?  Yes    Pain Score  4     Pain Location  Knee    Pain Orientation  Right;Left    Pain Descriptors / Indicators  Aching    Pain Type  Chronic pain    Pain Onset  More than a month ago    Pain Frequency  Intermittent    Aggravating Factors   sit>stand, prolonged standing and walking    Pain Relieving Factors  Pennsaid, taping                       [facility] Adult PT Treatment/Exercise - 04/13/18 0001      Exercises   Exercises  Knee/Hip      Knee/Hip Exercises: Stretches   Passive Hamstring Stretch  3 reps;30 seconds    ITB Stretch  Both;3 reps;30 seconds  Knee/Hip Exercises: Aerobic   Stationary Bike  L3-4 x 6 min      Knee/Hip Exercises: Standing   Hip Flexion  Both;2 sets;10 reps    Hip Flexion Limitations  red Theraband    Hip Abduction  Both;2 sets;10 reps    Abduction Limitations  red Theraband    Hip Extension  Both;2 sets;10 reps    Extension Limitations  red Theraband    Lateral Step Up  Both;1 set;15 reps   4 in.   Forward Step Up  1 set;15 reps;Both   4 in. step   Functional Squat  2 sets;10 reps   at // bars   Other Standing Knee Exercises  Monster walf 15 feet x 4 with red band      Knee/Hip Exercises: Seated   Long Arc Quad  --   2x12 bilat.   Long Arc Quad Weight  3 lbs.    Other Seated Knee/Hip Exercises  Unilat. hip ER with T-band (bilat.)   red x 15ea.     Knee/Hip Exercises: Supine   Bridges  2 sets;10 reps    Other Supine Knee/Hip Exercises  supine alt. unilat. clam with green band 2x10             PT Education - 04/13/18 1409    Education Details  POC, HEP    Person(s) Educated  Patient    Methods  Explanation    Comprehension  Verbalized understanding          PT Long Term  Goals - 04/06/18 1156      PT LONG TERM GOAL #1   Title  Pt will be I and compliant with HEP. 6 weeks 04/29/18    Baseline  updated today    Status  On-going      PT LONG TERM GOAL #2   Title  Pt will be able to go up down stairs and walk her dogs with less than 2-3/10 pain. 6 weeks 04/29/18    Status  On-going      PT LONG TERM GOAL #3   Title  Pt will improve hip strength to 5/5 MMT. 6 weeks 04/29/18    Status  On-going            Plan - 04/13/18 1409    Clinical Impression Statement  Fair progress in terms of knee pain-expect progress will be gradual for time for strengthening to take effect. Improving from previous status with closed chain knee patellofemoral mechanics and eccentric quad control.    Rehab Potential  Good    PT Frequency  2x / week    PT Duration  6 weeks    PT Treatment/Interventions  Cryotherapy;Electrical Stimulation;Iontophoresis 4mg /ml Dexamethasone;Moist Heat;Ultrasound;Therapeutic exercise;Neuromuscular re-education;Manual techniques;Passive range of motion;Dry needling;Taping;Vasopneumatic Device;Joint Manipulations    PT Next Visit Plan  Continue emphasis hip and quadricep strengthening, stretches, continue/progress closed chain activities as tolerated    PT Home Exercise Plan  Partial squats, SLRs, IT band and HS stretches    Consulted and Agree with Plan of Care  Patient       Patient will benefit from skilled therapeutic intervention in order to improve the following deficits and impairments:  Decreased activity tolerance, Decreased range of motion, Decreased strength, Hypomobility, Difficulty walking, Impaired flexibility, Pain  Visit Diagnosis: Patellofemoral syndrome of both knees  Chronic pain of both knees     Problem List Patient Active Problem List   Diagnosis Date Noted  . Patellofemoral syndrome of both knees 01/15/2018  .  Family history of thyroid cancer 12/19/2017  . Gastroesophageal reflux disease 04/22/2016  . Routine  general medical examination at a health care facility 03/16/2015  . Dyslipidemia 04/09/2013  . Osteopenia 04/06/2012  . Postmenopausal symptoms 02/12/2012  . Depression 11/11/2011  . Trigger finger of both hands   . IBS (irritable bowel syndrome)   . Abnormal chest x-ray    [name], PT, DPT 04/13/18 2:12 PM  [facility] [address] [place], [place], [address] Phone: [phone]   Fax:  [phone]  Name: Renee [name] MRN: [id number] Date of Birth: Jan 09, 1961

## 2018-04-15 ENCOUNTER — Encounter: Payer: Self-pay | Admitting: Physical Therapy

## 2018-04-15 ENCOUNTER — Ambulatory Visit: Payer: Federal, State, Local not specified - PPO | Admitting: Physical Therapy

## 2018-04-15 DIAGNOSIS — M222X2 Patellofemoral disorders, left knee: Secondary | ICD-10-CM | POA: Diagnosis not present

## 2018-04-15 DIAGNOSIS — M25562 Pain in left knee: Secondary | ICD-10-CM

## 2018-04-15 DIAGNOSIS — M25561 Pain in right knee: Secondary | ICD-10-CM | POA: Diagnosis not present

## 2018-04-15 DIAGNOSIS — G8929 Other chronic pain: Secondary | ICD-10-CM | POA: Diagnosis not present

## 2018-04-15 DIAGNOSIS — M222X1 Patellofemoral disorders, right knee: Secondary | ICD-10-CM | POA: Diagnosis not present

## 2018-04-15 NOTE — Patient Instructions (Signed)
+   Add Program Notes  1. Mini Squat with Counter Support  x Weekly x Daily reps sets  2. Step Up  x Weekly x Daily reps sets  3. PRIVATE "TYPE=PICT;ALT=Standing Repeated Hip Abduction with Resistance"Standing Repeated Hip Abduction with Resistance  x Weekly x Daily reps sets  4. Standing Repeated Hip Flexion with Resistance  x Weekly x Daily reps sets  5. Sitting Knee Extension with Resistance  x Weekly x Daily reps sets  6. Supine Bridge  x Weekly x Daily reps sets  7. Supine Hamstring Stretch with Strap  x Weekly x Daily reps sets hold  8. Hooklying Clamshell with Resistance  x Weekly x Daily reps sets  9. Supine ITB Stretch with Strap  x Weekly x Daily reps sets hold

## 2018-04-15 NOTE — Therapy (Signed)
[place], [place], [address] Phone: [phone]   Fax:  [phone]  Physical Therapy Treatment/Discharge  Patient Details  Name: Renee [name] MRN: [id number] Date of Birth: 08/25/1960 Referring Provider (PT): Hulan Saas, DO   Encounter Date: 04/15/2018  PT End of Session - 04/15/18 1243    Visit Number  7    Number of Visits  12    Date for PT Re-Evaluation  04/29/18    Authorization Type  [employer]    PT Start Time  1147    PT Stop Time  1227    PT Time Calculation (min)  40 min    Activity Tolerance  Patient tolerated treatment well    Behavior During Therapy  [facility] for tasks assessed/performed       Past Medical History:  Diagnosis Date  . Abnormal chest x-ray    CXR c/w COPD. Full PFTs '11 - normal  . Allergic rhinitis, cause unspecified   . Allergy    seasonal  . Anxiety   . Arthritis    both knees, post traumatic - neck  . Cataract    removed bilateral  . Depression    with anxiety.  . Depression   . Dyslipidemia   . Hx of colonic polyps 2008  . IBS (irritable bowel syndrome)   . 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 Migraines    during teenage years  . MVP (mitral valve prolapse)    last 2D echo approx '10  . Osteopenia    DEXA 03/2012: -1.0    Past Surgical History:  Procedure Laterality Date  . APPENDECTOMY    . BUNIONECTOMY WITH HAMMERTOE RECONSTRUCTION Left 07/06/13   [facility]  . CARDIAC CATHETERIZATION  2006   normal study  . CARPAL TUNNEL RELEASE  2011   right hand  . HYSTEROPLASTY REPAIR OF UTERINE ANOMALY     2003 - laproscopically; 2006 - laparotomy  . LAPAROSCOPIC ENDOMETRIOSIS FULGURATION     1996 and 2002  . MANDIBLE RECONSTRUCTION    . NASAL SEPTUM SURGERY  11/2012   [name]  . TRIGGER FINGER RELEASE  2011   R thimb and ring finger  . TRIGGER FINGER RELEASE  02/04/2012   Gramig, in office    There were no vitals filed for  this visit.  Subjective Assessment - 04/15/18 1150    Subjective  Bilat. knee pain about 3/10 pre-tx. Pt. rates improvement at around 50% from baseline. Pt. reports feels ready to d/c today and try continuing via HEP.    Limitations  Standing;Walking    How long can you stand comfortably?  an hour and a half    How long can you walk comfortably?  45 minutes    Diagnostic tests  NORMAL ULTRASONOGRAPHIC EXAMINATION OF THE KNEES, neg x-rays    Patient Stated Goals  less pain and stand/walk longer    Currently in Pain?  Yes    Pain Score  3     Pain Location  Knee    Pain Orientation  Right;Left    Pain Descriptors / Indicators  Aching    Pain Type  Chronic pain    Pain Onset  More than a month ago    Pain Frequency  Intermittent    Aggravating Factors   sit>stand, prolonged standing or walking    Pain Relieving Factors  Pennsaid, rest    Multiple Pain Sites  No  [facility] PT Assessment - 04/15/18 0001      Observation/Other Assessments   Focus on Therapeutic Outcomes (FOTO)   38% limited      ROM / Strength   AROM / PROM / Strength  AROM;Strength      AROM   AROM Assessment Site  Knee    Right/Left Knee  Right;Left    Right Knee Extension  0    Right Knee Flexion  145    Left Knee Extension  0    Left Knee Flexion  145      Strength   Strength Assessment Site  Knee;Hip    Right/Left Hip  Right;Left    Right Hip Flexion  4+/5    Right Hip Extension  4/5    Right Hip External Rotation   5/5    Right Hip Internal Rotation  5/5    Right Hip ABduction  4+/5    Left Hip Flexion  4+/5    Left Hip Extension  4/5    Left Hip External Rotation  5/5    Left Hip Internal Rotation  5/5    Left Hip ABduction  4+/5                   [facility] Adult PT Treatment/Exercise - 04/15/18 0001      Knee/Hip Exercises: Stretches   Passive Hamstring Stretch  Both;3 reps;30 seconds    ITB Stretch  Both;3 reps;30 seconds      Knee/Hip Exercises: Aerobic   Stationary Bike  x  6 min      Knee/Hip Exercises: Standing   Hip Flexion  Both;2 sets;10 reps    Hip Flexion Limitations  red Theraband    Hip Abduction  Both;2 sets;10 reps    Abduction Limitations  red Theraband    Lateral Step Up  Both;1 set;15 reps   side step down   Functional Squat  2 sets;10 reps   partial squat     Knee/Hip Exercises: Seated   Long Arc Quad  Both;2 sets;10 reps   red Theraband     Knee/Hip Exercises: Supine   Bridges  2 sets;10 reps    Other Supine Knee/Hip Exercises  supine alt. unilat. clam with red band 2x10             PT Education - 04/15/18 1242    Education Details  POC, HEP    Person(s) Educated  Patient    Methods  Explanation;Demonstration;Verbal cues    Comprehension  Verbalized understanding;Returned demonstration          PT Long Term Goals - 04/15/18 1251      PT LONG TERM GOAL #1   Title  Pt will be I and compliant with HEP.     Time  6    Period  Weeks    Status  Achieved      PT LONG TERM GOAL #2   Title  Pt will be able to go up down stairs and walk her dogs with less than 2-3/10 pain.    Time  6    Period  Weeks    Status  --   pain variable but 50% improved from baseline     PT LONG TERM GOAL #3   Title  Pt will improve hip strength to 5/5 MMT.     Time  6    Period  Weeks    Status  Not Met  Plan - 04/15/18 1244    Clinical Impression Statement  Pt. has made moderate improvements with therapy to date-still some issues with stairs but overall improving with standing/walking tolerance. Presentation consistent with bilat. patellofemoral pain with hip and eccentric quadricep weakness and altered closed kinetic chain patellofemoral mechanics. Pt. reports feels ready to d/c today so plan is for her to continue via updated HEP.    Clinical Presentation  Stable    Clinical Presentation due to:  improving    Clinical Decision Making  Low    Rehab Potential  Good    PT Frequency  --   d/c to HEP today   PT Duration   --   d/c to HEP today   PT Treatment/Interventions  --   d/c to HEP today   PT Home Exercise Plan  see chart copy of handout    Consulted and Agree with Plan of Care  Patient       Patient will benefit from skilled therapeutic intervention in order to improve the following deficits and impairments:  Decreased activity tolerance, Decreased range of motion, Decreased strength, Hypomobility, Difficulty walking, Impaired flexibility, Pain  Visit Diagnosis: Patellofemoral syndrome of both knees  Chronic pain of both knees     Problem List Patient Active Problem List   Diagnosis Date Noted  . Patellofemoral syndrome of both knees 01/15/2018  . Family history of thyroid cancer 12/19/2017  . Gastroesophageal reflux disease 04/22/2016  . Routine general medical examination at a health care facility 03/16/2015  . Dyslipidemia 04/09/2013  . Osteopenia 04/06/2012  . Postmenopausal symptoms 02/12/2012  . Depression 11/11/2011  . Trigger finger of both hands   . IBS (irritable bowel syndrome)   . Abnormal chest x-ray        PHYSICAL THERAPY DISCHARGE SUMMARY  Visits from Start of Care: 7  Current functional level related to goals / functional outcomes: Pt. Reports 50% improvement from baseline-see goals   Remaining deficits: Difficulty with prolonged standing and walking, stair navigation   Education / Equipment: HEP updated Plan: Patient agrees to discharge.  Patient goals were partially met. Patient is being discharged due to the patient's request.  ?????         [name], PT, DPT 04/15/18 1:01 PM  [facility] [address] [place], [place], [address] Phone: [phone]   Fax:  [phone]  Name: Jayliani Wanner MRN: [id number] Date of Birth: May 23, 1961

## 2018-04-17 ENCOUNTER — Ambulatory Visit (INDEPENDENT_AMBULATORY_CARE_PROVIDER_SITE_OTHER): Payer: Federal, State, Local not specified - PPO | Admitting: Internal Medicine

## 2018-04-17 DIAGNOSIS — Z0000 Encounter for general adult medical examination without abnormal findings: Secondary | ICD-10-CM | POA: Diagnosis not present

## 2018-04-17 LAB — PULMONARY FUNCTION TEST
FEF 25-75 Post: 2.48 L/sec
FEF 25-75 Pre: 2.04 L/sec
FEF2575-%CHANGE-POST: 21 %
FEF2575-%Pred-Post: 91 %
FEF2575-%Pred-Pre: 75 %
FEV1-%CHANGE-POST: 5 %
FEV1-%PRED-POST: 100 %
FEV1-%Pred-Pre: 94 %
FEV1-POST: 2.96 L
FEV1-Pre: 2.8 L
FEV1FVC-%Change-Post: 4 %
FEV1FVC-%Pred-Pre: 91 %
FEV6-%CHANGE-POST: 1 %
FEV6-%PRED-POST: 107 %
FEV6-%PRED-PRE: 106 %
FEV6-PRE: 3.89 L
FEV6-Post: 3.93 L
FEV6FVC-%CHANGE-POST: 0 %
FEV6FVC-%PRED-PRE: 103 %
FEV6FVC-%Pred-Post: 103 %
FVC-%Change-Post: 1 %
FVC-%PRED-POST: 104 %
FVC-%Pred-Pre: 102 %
FVC-Post: 3.94 L
FVC-Pre: 3.89 L
POST FEV6/FVC RATIO: 100 %
PRE FEV6/FVC RATIO: 100 %
Post FEV1/FVC ratio: 75 %
Pre FEV1/FVC ratio: 72 %
RV % PRED: 117 %
RV: 2.42 L
TLC % pred: 113 %
TLC: 6.24 L

## 2018-04-17 NOTE — Progress Notes (Signed)
PFT completed today.  

## 2018-04-26 NOTE — Progress Notes (Signed)
[name] [place], [place] [address] Phone: [phone] Subjective:    I [name] am serving as a Education administrator for Dr. Hulan Saas.    CC: Bilateral knee pain follow-up  [id number]  Renee [name] is a 57 y.o. female coming in with complaint of bilateral knee pain. Has been in PT. Pain in the right knee radiates down the leg. Doesn't happen often.  Patient states that overall seems to be doing well doing well but does still feel that patient is having more difficulty.      Past Medical History:  Diagnosis Date  . Abnormal chest x-ray    CXR c/w COPD. Full PFTs '11 - normal  . Allergic rhinitis, cause unspecified   . Allergy    seasonal  . Anxiety   . Arthritis    both knees, post traumatic - neck  . Cataract    removed bilateral  . Depression    with anxiety.  . Depression   . Dyslipidemia   . Hx of colonic polyps 2008  . IBS (irritable bowel syndrome)   . 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 Migraines    during teenage years  . MVP (mitral valve prolapse)    last 2D echo approx '10  . Osteopenia    DEXA 03/2012: -1.0   Past Surgical History:  Procedure Laterality Date  . APPENDECTOMY    . BUNIONECTOMY WITH HAMMERTOE RECONSTRUCTION Left 07/06/13   [facility]  . CARDIAC CATHETERIZATION  2006   normal study  . CARPAL TUNNEL RELEASE  2011   right hand  . HYSTEROPLASTY REPAIR OF UTERINE ANOMALY     2003 - laproscopically; 2006 - laparotomy  . LAPAROSCOPIC ENDOMETRIOSIS FULGURATION     1996 and 2002  . MANDIBLE RECONSTRUCTION    . NASAL SEPTUM SURGERY  11/2012   [name]  . TRIGGER FINGER RELEASE  2011   R thimb and ring finger  . TRIGGER FINGER RELEASE  02/04/2012   Gramig, in office   Social History   Socioeconomic History  . Marital status: Married    Spouse name: Not on file  . Number of children: 0  . Years of education: 37  . Highest education level: Not on file    Occupational History  . 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 Financial resource strain: Not on file  . Food insecurity:    Worry: Not on file    Inability: Not on file  . Transportation needs:    Medical: Not on file    Non-medical: Not on file  Tobacco Use  . Smoking status: Never Smoker  . Smokeless tobacco: Never Used  Substance and Sexual Activity  . Alcohol use: Yes    Comment: social  . Drug use: No  . Sexual activity: Yes    Partners: Male  Lifestyle  . Physical activity:    Days per week: Not on file    Minutes per session: Not on file  . Stress: Not on file  Relationships  . Social connections:    Talks on phone: Not on file    Gets together: Not on file    Attends religious service: Not on file    Active member of club or organization: Not on file    Attends meetings of clubs or organizations: Not on file    Relationship status: Not on file  Other  Topics Concern  . Not on file  Social History Narrative   Graduated from [employer] with degree in Food science/nutrition.   Married '94.   Denies any physical or sexual abuse.   Family in [place]: parents and sister.   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   Has smoke alarms, wears seatbelt at all times, uses helmut when appropriate, firearms present in the home, uses herbal remedies, caffeine- 2-3 per day.   No regular exercise.   Allergies  Allergen Reactions  . Codeine Other (See Comments)    Hot flashes/passed out  . Pollen Extract Other (See Comments)    drainage   Family History  Problem Relation Age of Onset  . Arthritis Mother        DOB 51  . Cancer Mother        Thyroid cancer  . Hypertension Mother   . Atrial fibrillation Mother   . Melanoma Mother   . Breast cancer Mother   . Arthritis Father        DOB 1929  . Melanoma Father   . Colon polyps Neg Hx   . Rectal cancer Neg Hx   . Stomach cancer Neg Hx       Current Outpatient  Medications (Respiratory):  .  fluticasone (FLONASE) 50 MCG/ACT nasal spray, Place 2 sprays into both nostrils daily.    Current Outpatient Medications (Other):  Marland Kitchen  Diclofenac Sodium 2 % SOLN, Place 2 g onto the skin 2 (two) times daily. Marland Kitchen  DIGESTIVE ENZYMES PO, Take 1 tablet by mouth every other day. .  L-GLUTAMINE PO, Take 1 tablet by mouth 2 (two) times a week. .  metroNIDAZOLE (METROCREAM) 0.75 % cream,  .  Multiple Vitamin (MULTIVITAMIN WITH MINERALS) TABS tablet, Take 1 tablet by mouth daily. Marland Kitchen  omeprazole (PRILOSEC) 40 MG capsule, TAKE 1 CAPSULE (40 MG TOTAL) BY MOUTH DAILY. Marland Kitchen  sertraline (ZOLOFT) 50 MG tablet, Take 2 tablets (100 mg total) by mouth daily. Take 1 & 1/2 tablets by mouth once daily .  simethicone (MYLICON) 80 MG chewable tablet, Chew 80 mg by mouth every 6 (six) hours as needed for flatulence.    Past medical history, social, surgical and family history all reviewed in electronic medical record.  No pertanent information unless stated regarding to the chief complaint.   Review of Systems:  No headache, visual changes, nausea, vomiting, diarrhea, constipation, dizziness, abdominal pain, skin rash, fevers, chills, night sweats, weight loss, swollen lymph nodes, body aches, joint swelling, chest pain, shortness of breath, mood changes.  Positive muscle aches  Objective  Blood pressure 130/70, pulse 73, height 5\' 7"  (1.702 m), weight 140 lb (63.5 kg), SpO2 98 %.    General: No apparent distress alert and oriented x3 mood and affect normal, dressed appropriately.  HEENT: Pupils equal, extraocular movements intact  Respiratory: Patient's speak in full sentences and does not appear short of breath  Cardiovascular: No lower extremity edema, non tender, no erythema  Skin: Warm dry intact with no signs of infection or rash on extremities or on axial skeleton.  Abdomen: Soft nontender  Neuro: Cranial nerves II through XII are intact, neurovascularly intact in all  extremities with 2+ DTRs and 2+ pulses.  Lymph: No lymphadenopathy of posterior or anterior cervical chain or axillae bilaterally.  Gait mild antalgic MSK:  tender with mild limited range of motion and good stability and symmetric strength and tone of shoulders, elbows, wrist, hip, nd ankles bilaterally.  Knee: Bilateral  Normal to inspection with no erythema or effusion or obvious bony abnormalities. Palpation tenderness to palpation over the patellofemoral joint ROM full in flexion and extension and lower leg rotation. Ligaments with solid consistent endpoints including ACL, PCL, LCL, MCL. Negative Mcmurray's, Apley's, and Thessalonian tests. painful patellar compression. Patellar glide with mild to moderate crepitus. Patellar and quadriceps tendons unremarkable. Hamstring and quadriceps strength is normal.     Impression and Recommendations:     This case required medical decision making of moderate complexity. The above documentation has been reviewed and is accurate and complete Lyndal Pulley, DO       Note: This dictation was prepared with Dragon dictation along with smaller phrase technology. Any transcriptional errors that result from this process are unintentional.

## 2018-04-28 ENCOUNTER — Ambulatory Visit: Payer: Federal, State, Local not specified - PPO | Admitting: Family Medicine

## 2018-04-28 ENCOUNTER — Encounter: Payer: Self-pay | Admitting: Family Medicine

## 2018-04-28 ENCOUNTER — Other Ambulatory Visit (INDEPENDENT_AMBULATORY_CARE_PROVIDER_SITE_OTHER): Payer: Federal, State, Local not specified - PPO

## 2018-04-28 VITALS — BP 130/70 | HR 73 | Ht 67.0 in | Wt 140.0 lb

## 2018-04-28 DIAGNOSIS — M222X2 Patellofemoral disorders, left knee: Secondary | ICD-10-CM | POA: Diagnosis not present

## 2018-04-28 DIAGNOSIS — M222X1 Patellofemoral disorders, right knee: Secondary | ICD-10-CM | POA: Diagnosis not present

## 2018-04-28 DIAGNOSIS — M2550 Pain in unspecified joint: Secondary | ICD-10-CM | POA: Diagnosis not present

## 2018-04-28 LAB — SEDIMENTATION RATE: Sed Rate: 10 mm/hr (ref 0–30)

## 2018-04-28 LAB — FERRITIN: FERRITIN: 53.6 ng/mL (ref 10.0–291.0)

## 2018-04-28 LAB — IBC PANEL
Iron: 68 ug/dL (ref 42–145)
Saturation Ratios: 19.4 % — ABNORMAL LOW (ref 20.0–50.0)
Transferrin: 251 mg/dL (ref 212.0–360.0)

## 2018-04-28 LAB — URIC ACID: Uric Acid, Serum: 3.9 mg/dL (ref 2.4–7.0)

## 2018-04-28 LAB — T3, FREE: T3, Free: 3.3 pg/mL (ref 2.3–4.2)

## 2018-04-28 NOTE — Patient Instructions (Signed)
Good to see you  We will get some other labs  Ice is your friend Stay active Lets give it another 6-7 weeks

## 2018-04-28 NOTE — Assessment & Plan Note (Addendum)
Patellofemoral syndrome bilaterally. Discussed labs due to pain overall  RTC in 4-6 weeks May need injections  Spent  25 minutes with patient face-to-face and had greater than 50% of counseling including as described above in assessment and plan.

## 2018-05-02 LAB — ANA: ANA: NEGATIVE

## 2018-05-02 LAB — VITAMIN D 1,25 DIHYDROXY
Vitamin D 1, 25 (OH)2 Total: 46 pg/mL (ref 18–72)
Vitamin D3 1, 25 (OH)2: 46 pg/mL

## 2018-05-02 LAB — CYCLIC CITRUL PEPTIDE ANTIBODY, IGG: Cyclic Citrullin Peptide Ab: <16 UNITS

## 2018-05-02 LAB — PTH, INTACT AND CALCIUM
CALCIUM: 9.7 mg/dL (ref 8.6–10.4)
PTH: 63 pg/mL (ref 14–64)

## 2018-05-02 LAB — RHEUMATOID FACTOR: Rhuematoid fact SerPl-aCnc: <14 IU/mL (ref <14)

## 2018-05-06 ENCOUNTER — Other Ambulatory Visit: Payer: Self-pay

## 2018-05-06 ENCOUNTER — Emergency Department (HOSPITAL_COMMUNITY)
Admission: EM | Admit: 2018-05-06 | Discharge: 2018-05-06 | Disposition: A | Discharge status: Home / Self Care | Payer: Federal, State, Local not specified - PPO | Attending: Emergency Medicine | Admitting: Emergency Medicine

## 2018-05-06 ENCOUNTER — Encounter (HOSPITAL_COMMUNITY): Payer: Self-pay | Admitting: Emergency Medicine

## 2018-05-06 DIAGNOSIS — R112 Nausea with vomiting, unspecified: Secondary | ICD-10-CM | POA: Diagnosis not present

## 2018-05-06 DIAGNOSIS — R531 Weakness: Secondary | ICD-10-CM | POA: Diagnosis not present

## 2018-05-06 DIAGNOSIS — R1084 Generalized abdominal pain: Secondary | ICD-10-CM | POA: Diagnosis not present

## 2018-05-06 DIAGNOSIS — Z79899 Other long term (current) drug therapy: Secondary | ICD-10-CM | POA: Insufficient documentation

## 2018-05-06 DIAGNOSIS — I4510 Unspecified right bundle-branch block: Secondary | ICD-10-CM | POA: Diagnosis not present

## 2018-05-06 DIAGNOSIS — I959 Hypotension, unspecified: Secondary | ICD-10-CM | POA: Diagnosis not present

## 2018-05-06 LAB — CBC WITH DIFFERENTIAL/PLATELET
Abs Immature Granulocytes: 0.02 10*3/uL (ref 0.00–0.07)
BASOS PCT: 0 %
Basophils Absolute: 0 10*3/uL (ref 0.0–0.1)
EOS ABS: 0 10*3/uL (ref 0.0–0.5)
Eosinophils Relative: 0 %
HEMATOCRIT: 40.8 % (ref 36.0–46.0)
Hemoglobin: 13.4 g/dL (ref 12.0–15.0)
Immature Granulocytes: 0 %
LYMPHS ABS: 0.5 10*3/uL — AB (ref 0.7–4.0)
Lymphocytes Relative: 7 %
MCH: 29.6 pg (ref 26.0–34.0)
MCHC: 32.8 g/dL (ref 30.0–36.0)
MCV: 90.1 fL (ref 80.0–100.0)
MONO ABS: 0.2 10*3/uL (ref 0.1–1.0)
Monocytes Relative: 2 %
Neutro Abs: 7 10*3/uL (ref 1.7–7.7)
Neutrophils Relative %: 91 %
PLATELETS: 202 10*3/uL (ref 150–400)
RBC: 4.53 MIL/uL (ref 3.87–5.11)
RDW: 12.2 % (ref 11.5–15.5)
WBC: 7.8 10*3/uL (ref 4.0–10.5)
nRBC: 0 % (ref 0.0–0.2)

## 2018-05-06 LAB — COMPREHENSIVE METABOLIC PANEL
ALK PHOS: 64 U/L (ref 38–126)
ALT: 15 U/L (ref 0–44)
AST: 20 U/L (ref 15–41)
Albumin: 4.1 g/dL (ref 3.5–5.0)
Anion gap: 9 (ref 5–15)
BILIRUBIN TOTAL: 1.3 mg/dL — AB (ref 0.3–1.2)
BUN: 12 mg/dL (ref 6–20)
CALCIUM: 9.3 mg/dL (ref 8.9–10.3)
CO2: 26 mmol/L (ref 22–32)
CREATININE: 0.79 mg/dL (ref 0.44–1.00)
Chloride: 103 mmol/L (ref 98–111)
GFR calc Af Amer: >60 mL/min (ref >60)
Glucose, Bld: 128 mg/dL — ABNORMAL HIGH (ref 70–99)
POTASSIUM: 3.7 mmol/L (ref 3.5–5.1)
Sodium: 138 mmol/L (ref 135–145)
TOTAL PROTEIN: 7.2 g/dL (ref 6.5–8.1)

## 2018-05-06 LAB — I-STAT BETA HCG BLOOD, ED (MC, WL, AP ONLY): I-stat hCG, quantitative: <5 m[IU]/mL (ref <5)

## 2018-05-06 LAB — LIPASE, BLOOD: LIPASE: 28 U/L (ref 11–51)

## 2018-05-06 MED ORDER — PROMETHAZINE HCL 25 MG PO TABS: 25.0000 mg | ORAL_TABLET | Freq: Four times a day (QID) | ORAL | 0 refills | Status: DC | PRN

## 2018-05-06 MED ORDER — PROMETHAZINE HCL 25 MG/ML IJ SOLN
25.0000 mg | Freq: Once | INTRAMUSCULAR | Status: AC
  Administered 2018-05-06: 25 mg via INTRAVENOUS
  Filled 2018-05-06: qty 1

## 2018-05-06 NOTE — Discharge Instructions (Addendum)
Use phenergan as needed for nausea. Stay well hydrated with small sips of fluids throughout the day. Follow a BRAT diet (bananas, rice, applesauce, toast) for the next 24-48 hours. Follow up with your primary care provider within the next week or sooner if needed. Return to ER for changing or worsening of symptoms.

## 2018-05-06 NOTE — ED Triage Notes (Addendum)
Per ems pt had a different food and then nausea, weakness, abdominal pain and pale. Pt is alert oriented x 4.  Ems gave 4mg  zofran 500 bolus. 78 palpated 136/60, hr 76, 20 rr, 100% ra cbg 157

## 2018-05-06 NOTE — ED Notes (Signed)
Pt is very dizzy and asked to lat there for a few mins bc she felt sick

## 2018-05-06 NOTE — ED Provider Notes (Signed)
[name] Provider Note   CSN: [id number] Arrival date & time: 05/06/18  1547     History   Chief Complaint Chief Complaint  Patient presents with  . Weakness    HPI Renee [name] is a 57 y.o. female presenting with nausea, vomiting, and diarrhea onset today at 1am. Patient reports mild epigastric abdominal pain due to the constant nausea. Patient also reports diffuse weakness. Patient states she tried Peppermint tea without relief. Patient states tea aggravated her nausea. Patient reports she ate a Quiche last night around 21:00. Patient denies any sick contacts, recent travel, or any one else sick at home. Patient reports associated chills and night sweats. Patient denies chest pain, shortness of breath, dizziness, or fever.   HPI  Past Medical History:  Diagnosis Date  . Abnormal chest x-ray    CXR c/w COPD. Full PFTs '11 - normal  . Allergic rhinitis, cause unspecified   . Allergy    seasonal  . Anxiety   . Arthritis    both knees, post traumatic - neck  . Cataract    removed bilateral  . Depression    with anxiety.  . Depression   . Dyslipidemia   . Hx of colonic polyps 2008  . IBS (irritable bowel syndrome)   . 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 Migraines    during teenage years  . MVP (mitral valve prolapse)    last 2D echo approx '10  . Osteopenia    DEXA 03/2012: -1.0    Patient Active Problem List   Diagnosis Date Noted  . Patellofemoral syndrome of both knees 01/15/2018  . Family history of thyroid cancer 12/19/2017  . Gastroesophageal reflux disease 04/22/2016  . Routine general medical examination at a health care facility 03/16/2015  . Dyslipidemia 04/09/2013  . Osteopenia 04/06/2012  . Postmenopausal symptoms 02/12/2012  . Depression 11/11/2011  . Trigger finger of both hands   . IBS (irritable bowel syndrome)   . Abnormal chest x-ray     Past Surgical History:    Procedure Laterality Date  . APPENDECTOMY    . BUNIONECTOMY WITH HAMMERTOE RECONSTRUCTION Left 07/06/13   [facility]  . CARDIAC CATHETERIZATION  2006   normal study  . CARPAL TUNNEL RELEASE  2011   right hand  . HYSTEROPLASTY REPAIR OF UTERINE ANOMALY     2003 - laproscopically; 2006 - laparotomy  . LAPAROSCOPIC ENDOMETRIOSIS FULGURATION     1996 and 2002  . MANDIBLE RECONSTRUCTION    . NASAL SEPTUM SURGERY  11/2012   [name]  . TRIGGER FINGER RELEASE  2011   R thimb and ring finger  . TRIGGER FINGER RELEASE  02/04/2012   [facility], in office     OB History   None      Home Medications    Prior to Admission medications   Medication Sig Start Date End Date Taking? Authorizing Provider  Diclofenac Sodium 2 % SOLN Place 2 g onto the skin 2 (two) times daily. 01/15/18   Lyndal Pulley, DO  DIGESTIVE ENZYMES PO Take 1 tablet by mouth every other day.    [provider]  fluticasone (FLONASE) 50 MCG/ACT nasal spray Place 2 sprays into both nostrils daily. 04/09/18   [name], MD  L-GLUTAMINE PO Take 1 tablet by mouth 2 (two) times a week.    [provider]  metroNIDAZOLE (METROCREAM) 0.75 % cream  04/18/16   [provider]  Multiple Vitamin (MULTIVITAMIN WITH MINERALS) TABS tablet Take 1 tablet by mouth daily.    [provider]  omeprazole (PRILOSEC) 40 MG capsule TAKE 1 CAPSULE (40 MG TOTAL) BY MOUTH DAILY. 06/03/16   [name], MD  promethazine (PHENERGAN) 25 MG tablet Take 1 tablet (25 mg total) by mouth every 6 (six) hours as needed for nausea or vomiting. 05/06/18   Darlin Drop P, PA-C  sertraline (ZOLOFT) 50 MG tablet Take 2 tablets (100 mg total) by mouth daily. Take 1 & 1/2 tablets by mouth once daily 04/09/18   [name], MD  simethicone (MYLICON) 80 MG chewable tablet Chew 80 mg by mouth every 6 (six) hours as needed for flatulence.    [provider]    Family History Family History  Problem  Relation Age of Onset  . Arthritis Mother        DOB 56  . Cancer Mother        Thyroid cancer  . Hypertension Mother   . Atrial fibrillation Mother   . Melanoma Mother   . Breast cancer Mother   . Arthritis Father        DOB 1929  . Melanoma Father   . Colon polyps Neg Hx   . Rectal cancer Neg Hx   . Stomach cancer Neg Hx     Social History Social History   Tobacco Use  . Smoking status: Never Smoker  . Smokeless tobacco: Never Used  Substance Use Topics  . Alcohol use: Yes    Comment: social  . Drug use: No     Allergies   Codeine and Pollen extract   Review of Systems Review of Systems  Constitutional: Positive for chills and diaphoresis. Negative for activity change, appetite change, fever and unexpected weight change.  HENT: Positive for postnasal drip. Negative for congestion, rhinorrhea and sore throat.   Eyes: Negative for visual disturbance.  Respiratory: Negative for cough and shortness of breath.   Cardiovascular: Negative for chest pain.  Gastrointestinal: Positive for abdominal pain, diarrhea, nausea and vomiting. Negative for constipation.  Genitourinary: Negative for dysuria, flank pain and frequency.  Musculoskeletal: Negative for back pain.  Skin: Negative for rash.  Allergic/Immunologic: Positive for environmental allergies. Negative for immunocompromised state.  Neurological: Positive for weakness (Pt reports diffuse weakness.). Negative for dizziness, syncope, facial asymmetry, speech difficulty, light-headedness, numbness and headaches.  Psychiatric/Behavioral: The patient is not nervous/anxious.      Physical Exam Updated Vital Signs BP 128/74   Pulse 69   Temp 97.9 F (36.6 C) (Oral)   Resp 18   Ht 5\' 7"  (1.702 m)   Wt 62.6 kg   SpO2 100%   BMI 21.61 kg/m   Physical Exam  Constitutional: She is oriented to person, place, and time. She appears well-developed and well-nourished. No distress.  HENT:  Head: Normocephalic and  atraumatic.  Eyes: Pupils are equal, round, and reactive to light. Conjunctivae and EOM are normal.  Neck: Normal range of motion. Neck supple.  Cardiovascular: Normal rate, regular rhythm and normal heart sounds. Exam reveals no gallop and no friction rub.  No murmur heard. Pulmonary/Chest: Effort normal and breath sounds normal. No respiratory distress. She has no wheezes. She has no rales.  Abdominal: Soft. Bowel sounds are normal. She exhibits no distension and no mass. There is no tenderness. There is no rigidity, no rebound, no guarding and no CVA tenderness. No hernia.  Musculoskeletal: Normal range of motion.  Neurological: She  is alert and oriented to person, place, and time. No cranial nerve deficit or sensory deficit. She exhibits normal muscle tone.  Skin: Skin is warm. No rash noted. She is not diaphoretic.  Psychiatric: She has a normal mood and affect.  Nursing note and vitals reviewed.    ED Treatments / Results  Labs (all labs ordered are listed, but only abnormal results are displayed) Labs Reviewed  COMPREHENSIVE METABOLIC PANEL - Abnormal; Notable for the following components:      Result Value   Glucose, Bld 128 (*)    Total Bilirubin 1.3 (*)    All other components within normal limits  CBC WITH DIFFERENTIAL/PLATELET - Abnormal; Notable for the following components:   Lymphs Abs 0.5 (*)    All other components within normal limits  LIPASE, BLOOD  I-STAT BETA HCG BLOOD, ED ([facility], [facility], [facility] ONLY)    EKG EKG Interpretation  Date/Time:  Wednesday May 06 2018 15:53:44 EST Ventricular Rate:  61 PR Interval:    QRS Duration: 145 QT Interval:  498 QTC Calculation: 506 R Axis:   107 Text Interpretation:  Sinus rhythm Right bundle branch block Lateral infarct, age indeterminate Baseline wander in lead(s) I II aVR Interpretation limited secondary to artifact Confirmed by Virgel Manifold [phone]) on 05/06/2018 3:57:42 PM   Radiology No results  found.  Procedures Procedures (including critical care time)  Medications Ordered in ED Medications  promethazine (PHENERGAN) injection 25 mg (25 mg Intravenous Given 05/06/18 1655)     Initial Impression / Assessment and Plan / ED Course  I have reviewed the triage vital signs and the nursing notes.  Pertinent labs & imaging results that were available during my care of the patient were reviewed by me and considered in my medical decision making (see chart for details).   CBC, CMP, and lipase are all within normal limits with the exception of her glucose, total bilirubin, which are mildly elevated. Suspect this is not associated with patient's symptoms. Advised patient to follow up with her PCP regarding these results.  I have considered other causes of vomiting including, but not limited to: systemic infection, Appendicitis, biliary colic, perforated viscus. In this non-toxic, afebrile adult with a normal abdominal exam, and in light of the history, I think those considerations are very unlikely at this time. Clinical Course as of May 06 1918  Wed May 06, 2018  1843 Patient reports her nausea has improved and has been able to sleep. Patient denies any more episodes of vomiting or diarrhea.    [AH]  1903 Patient reports she feels a lot better and is eager to go home. Since patient denies any urinary symptoms, will dc UA. Discussed this decision with patient and she agrees.    [AH]    Clinical Course User Index [AH] Arville Lime, PA-C    Patient is nontoxic, nonseptic appearing, in no apparent distress.  Patient's pain and other symptoms adequately managed in emergency department.  Fluid bolus given.  Labs, imaging and vitals reviewed.  Patient does not meet the SIRS or Sepsis criteria.  On repeat exam patient does not have a surgical abdomin and there are no peritoneal signs.  No indication of appendicitis, bowel obstruction, bowel perforation, cholecystitis, diverticulitis, PID or  ectopic pregnancy.  Patient discharged home with symptomatic treatment and given strict instructions for follow-up with their primary care physician.  I have also discussed reasons to return immediately to the ER.  Patient expresses understanding and agrees with plan.  Final Clinical Impressions(s) / ED Diagnoses   Final diagnoses:  Nausea and vomiting in adult  Weakness    ED Discharge Orders         Ordered    promethazine (PHENERGAN) 25 MG tablet  Every 6 hours PRN     05/06/18 1915           Arville Lime, [place] 05/06/18 1920    Virgel Manifold, MD 05/07/18 [phone]

## 2018-05-08 ENCOUNTER — Ambulatory Visit: Payer: Self-pay | Admitting: *Deleted

## 2018-05-08 ENCOUNTER — Ambulatory Visit: Payer: Federal, State, Local not specified - PPO | Admitting: Internal Medicine

## 2018-05-08 NOTE — Telephone Encounter (Signed)
Noted  

## 2018-05-08 NOTE — Telephone Encounter (Signed)
Patient husband Wilhemena Durie called to say his wife was taken to ER a few days ago but was not formally Dxed but was given medication for nausea and that its not working for her. I offered a follow up appointment but he just wanted medication stating that she need something other than the promethazine (PHENERGAN) 25 MG tablet that was given.Marland KitchenPh#  [phone]  Additional Information . Commented on: Nausea lasts > 1 week    Husband is calling- wife was seen at ED on Wednesday- she was not given definitive diagnosis. He is concerned that she is still nauseous and the medication given does not seem to be helping. Call to office- with it being Friday- patient may need to be seen per [facility]- appointment scheduled. Husband state's if wife makes major turn today he will call back to cancel appointment.  Answer Assessment - Initial Assessment Questions 1. NAUSEA SEVERITY: "How bad is the nausea?" (e.g., mild, moderate, severe; dehydration, weight loss)   - MILD: loss of appetite without change in eating habits   - MODERATE: decreased oral intake without significant weight loss, dehydration, or malnutrition   - SEVERE: inadequate caloric or fluid intake, significant weight loss, symptoms of dehydration     moderate 2. ONSET: "When did the nausea begin?"     Wednesday night 3. VOMITING: "Any vomiting?" If so, ask: "How many times today?"     Vomiting once on Wednesday- nothing since 4. RECURRENT SYMPTOM: "Have you had nausea before?" If so, ask: "When was the last time?" "What happened that time?"     Yes- 7-8 years, never got this bad, symptoms passed quickly within a day 5. CAUSE: "What do you think is causing the nausea?"     Thought food poisoning at first- not better 6. PREGNANCY: "Is there any chance you are pregnant?" (e.g., unprotected intercourse, missed birth control pill, broken condom)     n/a  Protocols used: NAUSEA-A-AH  Patient has had diarrhea episodes.

## 2018-05-08 NOTE — Telephone Encounter (Signed)
Attempted to contact pt; no answer at [phone].

## 2018-05-08 NOTE — Telephone Encounter (Signed)
Pt called with a question of what decongestant that she could take with phenergan. She stated the phenergan was not really working like it did when she got it IV or per suppository. But also need something for sinus drainage. She had bought Sudafed Head and Congestion. Per [facility], there is not an adverse reaction when you take these two meds together. Advised to check with her pharmacist to see what other medications she could take over the counter with her regular medications. Pt voiced understanding.  Reason for Disposition . Caller has medication question about med not prescribed by PCP and triager unable to answer question (e.g., compatibility with other med, storage)  Additional Information . Commented on: Answer Assessment - Initial Assessment Questions    Pt is not having symptoms from medications  Protocols used: MEDICATION QUESTION CALL-A-AH

## 2018-05-12 ENCOUNTER — Ambulatory Visit: Payer: Federal, State, Local not specified - PPO | Admitting: Family

## 2018-05-12 ENCOUNTER — Encounter: Payer: Self-pay | Admitting: Family

## 2018-05-12 ENCOUNTER — Other Ambulatory Visit: Payer: Self-pay | Admitting: Family

## 2018-05-12 DIAGNOSIS — R1084 Generalized abdominal pain: Secondary | ICD-10-CM | POA: Diagnosis not present

## 2018-05-12 MED ORDER — PANTOPRAZOLE SODIUM 40 MG PO TBEC: 40.0000 mg | DELAYED_RELEASE_TABLET | Freq: Two times a day (BID) | ORAL | 0 refills | Status: DC

## 2018-05-12 NOTE — Patient Instructions (Signed)
Walk into [facility] [address] for CT tomorrow;  Nothing to eat or drink 4 hours prior; can walk in as early as 8:30 am; must be there by 3:00 pm.

## 2018-05-12 NOTE — Progress Notes (Signed)
Renee [name] is a 57 y.o. female with the following history as recorded in EpicCare:  Patient Active Problem List   Diagnosis Date Noted  . Patellofemoral syndrome of both knees 01/15/2018  . Family history of thyroid cancer 12/19/2017  . Gastroesophageal reflux disease 04/22/2016  . Routine general medical examination at a health care facility 03/16/2015  . Dyslipidemia 04/09/2013  . Osteopenia 04/06/2012  . Postmenopausal symptoms 02/12/2012  . Depression 11/11/2011  . Trigger finger of both hands   . IBS (irritable bowel syndrome)   . Abnormal chest x-ray     Current Outpatient Medications  Medication Sig Dispense Refill  . Diclofenac Sodium 2 % SOLN Place 2 g onto the skin 2 (two) times daily. 112 g 3  . DIGESTIVE ENZYMES PO Take 1 tablet by mouth every other day.    . fluticasone (FLONASE) 50 MCG/ACT nasal spray Place 2 sprays into both nostrils daily. 48 g 3  . L-GLUTAMINE PO Take 1 tablet by mouth 2 (two) times a week.    . metroNIDAZOLE (METROCREAM) 0.75 % cream     . Multiple Vitamin (MULTIVITAMIN WITH MINERALS) TABS tablet Take 1 tablet by mouth daily.    . pantoprazole (PROTONIX) 40 MG tablet Take 1 tablet (40 mg total) by mouth 2 (two) times daily before a meal. 60 tablet 0  . sertraline (ZOLOFT) 50 MG tablet Take 2 tablets (100 mg total) by mouth daily. Take 1 & 1/2 tablets by mouth once daily 180 tablet 3  . simethicone (MYLICON) 80 MG chewable tablet Chew 80 mg by mouth every 6 (six) hours as needed for flatulence.     No current facility-administered medications for this visit.     Allergies: Codeine and Pollen extract  Past Medical History:  Diagnosis Date  . Abnormal chest x-ray    CXR c/w COPD. Full PFTs '11 - normal  . Allergic rhinitis, cause unspecified   . Allergy    seasonal  . Anxiety   . Arthritis    both knees, post traumatic - neck  . Cataract    removed bilateral  . Depression    with anxiety.  . Depression   . Dyslipidemia   . Hx of  colonic polyps 2008  . IBS (irritable bowel syndrome)   . 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 Migraines    during teenage years  . MVP (mitral valve prolapse)    last 2D echo approx '10  . Osteopenia    DEXA 03/2012: -1.0    Past Surgical History:  Procedure Laterality Date  . APPENDECTOMY    . BUNIONECTOMY WITH HAMMERTOE RECONSTRUCTION Left 07/06/13   [facility]  . CARDIAC CATHETERIZATION  2006   normal study  . CARPAL TUNNEL RELEASE  2011   right hand  . HYSTEROPLASTY REPAIR OF UTERINE ANOMALY     2003 - laproscopically; 2006 - laparotomy  . LAPAROSCOPIC ENDOMETRIOSIS FULGURATION     1996 and 2002  . MANDIBLE RECONSTRUCTION    . NASAL SEPTUM SURGERY  11/2012   [name]  . TRIGGER FINGER RELEASE  2011   R thimb and ring finger  . TRIGGER FINGER RELEASE  02/04/2012   [facility], in office    Family History  Problem Relation Age of Onset  . Arthritis Mother        DOB 2  . Cancer Mother        Thyroid cancer  . Hypertension Mother   . Atrial fibrillation  Mother   . Melanoma Mother   . Breast cancer Mother   . Arthritis Father        DOB 1929  . Melanoma Father   . Colon polyps Neg Hx   . Rectal cancer Neg Hx   . Stomach cancer Neg Hx     Social History   Tobacco Use  . Smoking status: Never Smoker  . Smokeless tobacco: Never Used  Substance Use Topics  . Alcohol use: Yes    Comment: social    Subjective:  Patient was seen at the ER on 05/06/2018 with nausea and vomiting; no imaging was done/ labs were slightly abnormal- thought to be viral; given Phenergan/ thought to be viral in nature; presents today to follow-up today- notes that pain has persisted x 1 week; describes as "constant gnawing pain." Increased burping recently; does not take NSAIDs on a regular basis/ stress level has been normal; no coffee grounds emesis; no blood or dark stools; no recent travel; no fever;     Objective:  Vitals:   05/12/18 1558  BP: 112/72  Pulse:  83  Temp: 98.1 F (36.7 C)  TempSrc: Oral  SpO2: 97%  Weight: 138 lb 1.3 oz (62.6 kg)  Height: 5\' 7"  (1.702 m)    General: Well developed, well nourished, in no acute distress  Skin : Warm and dry.  Head: Normocephalic and atraumatic  Eyes: Sclera and conjunctiva clear; pupils round and reactive to light; extraocular movements intact  Ears: External normal; canals clear; tympanic membranes normal  Oropharynx: Pink, supple. No suspicious lesions  Neck: Supple without thyromegaly, adenopathy  Lungs: Respirations unlabored; clear to auscultation bilaterally without wheeze, rales, rhonchi  CVS exam: normal rate and regular rhythm.  Abdomen: Soft; tender over RUQ/ epigastric area; nondistended; normoactive bowel sounds; no masses or hepatosplenomegaly  Musculoskeletal: No deformities; no active joint inflammation  Extremities: No edema, cyanosis, clubbing  Vessels: Symmetric bilaterally  Neurologic: Alert and oriented; speech intact; face symmetrical; moves all extremities well; CNII-XII intact without focal deficit   Assessment:  1. Generalized abdominal pain     Plan:  Reviewed ER notes/ labs- labs were reassuring; ? Gastritis vs gallbladder disease vs ulcer; trial of Protonix 40 mg bid; will update abdominal pelvic CT tomorrow; follow-up to be determined.   No follow-ups on file.  No orders of the defined types were placed in this encounter.   Requested Prescriptions   Signed Prescriptions Disp Refills  . pantoprazole (PROTONIX) 40 MG tablet 60 tablet 0    Sig: Take 1 tablet (40 mg total) by mouth 2 (two) times daily before a meal.

## 2018-05-13 ENCOUNTER — Other Ambulatory Visit: Payer: Self-pay | Admitting: Family

## 2018-05-13 ENCOUNTER — Ambulatory Visit
Admission: RE | Admit: 2018-05-13 | Discharge: 2018-05-13 | Disposition: A | Discharge status: Home / Self Care | Payer: Federal, State, Local not specified - PPO | Source: Ambulatory Visit | Attending: Family | Admitting: Family

## 2018-05-13 ENCOUNTER — Telehealth: Payer: Self-pay | Admitting: Internal Medicine

## 2018-05-13 DIAGNOSIS — R1084 Generalized abdominal pain: Secondary | ICD-10-CM

## 2018-05-13 MED ORDER — IOPAMIDOL (ISOVUE-300) INJECTION 61%
100.0000 mL | Freq: Once | INTRAVENOUS | Status: AC | PRN
  Administered 2018-05-13: 100 mL via INTRAVENOUS

## 2018-05-13 NOTE — Telephone Encounter (Signed)
Loma Sousa, CT Technologist from [facility] called a report from CT Abomen/Pelvis with contrast-no cause for acute abdominal pain. Result read back and verified, it is resulted in patient's chart. She says she will let the patient go and advised someone from the office will give her a call.

## 2018-05-13 NOTE — Telephone Encounter (Signed)
Looks like ct was ordered by [facility]

## 2018-05-19 ENCOUNTER — Encounter: Payer: Self-pay | Admitting: Gastroenterology

## 2018-05-19 ENCOUNTER — Ambulatory Visit: Payer: Federal, State, Local not specified - PPO | Admitting: Gastroenterology

## 2018-05-19 VITALS — BP 120/76 | HR 77 | Ht 67.5 in | Wt 138.0 lb

## 2018-05-19 DIAGNOSIS — R1013 Epigastric pain: Secondary | ICD-10-CM | POA: Diagnosis not present

## 2018-05-19 NOTE — Patient Instructions (Signed)
Continue your protonix twice daily for two more weeks.   Advance your diet slowly and call our office back as needed.

## 2018-05-19 NOTE — Progress Notes (Signed)
I agree with the above note, plan 

## 2018-05-19 NOTE — Progress Notes (Signed)
05/19/2018 Renee [name] [id number] June 16, 1961   HISTORY OF PRESENT ILLNESS: This is a pleasant 57 year old female who is a patient of Dr. Ardis Hughs.  She was last seen here in October 2017 by myself for acid reflux type symptoms.  Recently she has not been on any type of antireflux medicine.  Says that 2 weeks ago she had what she thought was a stomach virus.  She did not have any vomiting, but her stomach was just churning and very uncomfortable.  Somewhat of a burning or gnawing type pain.  She did have some diarrhea, but says that that is not unusual for her to have episodes of diarrhea anyways.  She went to the emergency department on November 6 at which time CBC, CMP, lipase were unremarkable.  She then saw her PCP on November 12 at which time she was prescribed pantoprazole 40 mg twice daily and scheduled for CT scan.  CT scan was performed on November 13, this was of the abdomen and pelvis with contrast.  It was unremarkable.  She tells me that she has been on the pantoprazole 40 mg twice daily since that time, which is a just about a week.  She says that at this point she has been feeling much better.    Past Medical History:  Diagnosis Date  . Abnormal chest x-ray    CXR c/w COPD. Full PFTs '11 - normal  . Allergic rhinitis, cause unspecified   . Allergy    seasonal  . Anxiety   . Arthritis    both knees, post traumatic - neck  . Cataract    removed bilateral  . Depression    with anxiety.  . Depression   . Dyslipidemia   . Hx of colonic polyps 2008  . IBS (irritable bowel syndrome)   . 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 Migraines    during teenage years  . MVP (mitral valve prolapse)    last 2D echo approx '10  . Osteopenia    DEXA 03/2012: -1.0   Past Surgical History:  Procedure Laterality Date  . APPENDECTOMY    . BUNIONECTOMY WITH HAMMERTOE RECONSTRUCTION Left 07/06/13   [facility]  . CARDIAC CATHETERIZATION  2006   normal  study  . CARPAL TUNNEL RELEASE  2011   right hand  . HYSTEROPLASTY REPAIR OF UTERINE ANOMALY     2003 - laproscopically; 2006 - laparotomy  . LAPAROSCOPIC ENDOMETRIOSIS FULGURATION     1996 and 2002  . MANDIBLE RECONSTRUCTION    . NASAL SEPTUM SURGERY  11/2012   [name]  . TRIGGER FINGER RELEASE  2011   R thimb and ring finger  . TRIGGER FINGER RELEASE  02/04/2012   [facility]ig, in office    reports that she has never smoked. She has never used smokeless tobacco. She reports that she drinks alcohol. She reports that she does not use drugs. family history includes Arthritis in her father and mother; Atrial fibrillation in her mother; Breast cancer in her mother; Cancer in her mother; Hypertension in her mother; Melanoma in her father and mother. Allergies  Allergen Reactions  . Codeine Other (See Comments)    Hot flashes/passed out  . Pollen Extract Other (See Comments)    drainage      Outpatient Encounter Medications as of 05/19/2018  Medication Sig  . Diclofenac Sodium 2 % SOLN Place 2 g onto the skin 2 (two) times daily.  Marland Kitchen DIGESTIVE ENZYMES PO  Take 1 tablet by mouth every other day.  . fluticasone (FLONASE) 50 MCG/ACT nasal spray Place 2 sprays into both nostrils daily.  . metroNIDAZOLE (METROCREAM) 0.75 % cream   . Multiple Vitamin (MULTIVITAMIN WITH MINERALS) TABS tablet Take 1 tablet by mouth daily.  . pantoprazole (PROTONIX) 40 MG tablet Take 1 tablet (40 mg total) by mouth 2 (two) times daily before a meal.  . sertraline (ZOLOFT) 50 MG tablet Take 2 tablets (100 mg total) by mouth daily. Take 1 & 1/2 tablets by mouth once daily  . simethicone (MYLICON) 80 MG chewable tablet Chew 80 mg by mouth every 6 (six) hours as needed for flatulence.  . [DISCONTINUED] L-GLUTAMINE PO Take 1 tablet by mouth 2 (two) times a week.   No facility-administered encounter medications on file as of 05/19/2018.      REVIEW OF SYSTEMS  : All other systems reviewed and negative except where noted in  the History of Present Illness.   PHYSICAL EXAM: BP 120/76   Pulse 77   Ht 5' 7.5" (1.715 m)   Wt 138 lb (62.6 kg)   LMP  (LMP Unknown)   BMI 21.29 kg/m  General: Well developed white female in no acute distress Head: Normocephalic and atraumatic Eyes:  Sclerae anicteric, conjunctiva pink. Ears: Normal auditory acuity Lungs: Clear throughout to auscultation; no increased WOB. Heart: Regular rate and rhythm; no M/R/G. Abdomen: Soft, non-distended.  BS present.  Non-tender. Musculoskeletal: Symmetrical with no gross deformities  Skin: No lesions on visible extremities Extremities: No edema  Neurological: Alert oriented x 4, grossly non-focal Psychological:  Alert and cooperative. Normal mood and affect  ASSESSMENT AND PLAN: *Epigastric abdominal pain:  Was started on pantoprazole 40 mg BID, which she has only been taking for 6 or 7 days, and is already feeling much better.  ? Some type of post-infectious gastritis.  Will continue pantoprazole 40 mg BID for another 1-2 weeks as she continues to increase her diet and if still feeling better then can discontinue it.   CC:  Marrian Salvage,*

## 2018-06-03 ENCOUNTER — Other Ambulatory Visit: Payer: Self-pay

## 2018-06-04 MED ORDER — PANTOPRAZOLE SODIUM 40 MG PO TBEC: 40.0000 mg | DELAYED_RELEASE_TABLET | Freq: Every day | ORAL | 0 refills | Status: DC

## 2018-06-08 NOTE — Progress Notes (Signed)
[name] [address], [place] [address] Phone: [phone] Subjective:    I Renee [name] am serving as a Education administrator for Dr. Hulan Saas.   CC: Bilateral knee pain  [id number]  Renee [name] is a 57 y.o. female coming in with complaint of bilateral knee pain. States that her knees are feeling good. Noncompliant with exercises but is seeing improvement. Most pain is from being on her feet a lot.        Past Medical History:  Diagnosis Date  . Abnormal chest x-ray    CXR c/w COPD. Full PFTs '11 - normal  . Allergic rhinitis, cause unspecified   . Allergy    seasonal  . Anxiety   . Arthritis    both knees, post traumatic - neck  . Cataract    removed bilateral  . Depression    with anxiety.  . Depression   . Dyslipidemia   . Hx of colonic polyps 2008  . IBS (irritable bowel syndrome)   . 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 Migraines    during teenage years  . MVP (mitral valve prolapse)    last 2D echo approx '10  . Osteopenia    DEXA 03/2012: -1.0   Past Surgical History:  Procedure Laterality Date  . APPENDECTOMY    . BUNIONECTOMY WITH HAMMERTOE RECONSTRUCTION Left 07/06/13   [facility]  . CARDIAC CATHETERIZATION  2006   normal study  . CARPAL TUNNEL RELEASE  2011   right hand  . HYSTEROPLASTY REPAIR OF UTERINE ANOMALY     2003 - laproscopically; 2006 - laparotomy  . LAPAROSCOPIC ENDOMETRIOSIS FULGURATION     1996 and 2002  . MANDIBLE RECONSTRUCTION    . NASAL SEPTUM SURGERY  11/2012   [name]  . TRIGGER FINGER RELEASE  2011   R thimb and ring finger  . TRIGGER FINGER RELEASE  02/04/2012   [place], in office   Social History   Socioeconomic History  . Marital status: Married    Spouse name: Not on file  . Number of children: 0  . Years of education: 61  . Highest education level: Not on file  Occupational History  . 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 Financial resource strain: Not on file  . Food insecurity:    Worry: Not on file    Inability: Not on file  . Transportation needs:    Medical: Not on file    Non-medical: Not on file  Tobacco Use  . Smoking status: Never Smoker  . Smokeless tobacco: Never Used  Substance and Sexual Activity  . Alcohol use: Yes    Comment: social  . Drug use: No  . Sexual activity: Yes    Partners: Male  Lifestyle  . Physical activity:    Days per week: Not on file    Minutes per session: Not on file  . Stress: Not on file  Relationships  . Social connections:    Talks on phone: Not on file    Gets together: Not on file    Attends religious service: Not on file    Active member of club or organization: Not on file    Attends meetings of clubs or organizations: Not on file    Relationship status: Not on file  Other Topics Concern  . Not on file  Social History Narrative   Graduated from  [employer] with degree in Food science/nutrition.   Married '94.   Denies any physical or sexual abuse.   Family in [place]: parents and sister.   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   Has smoke alarms, wears seatbelt at all times, uses helmut when appropriate, firearms present in the home, uses herbal remedies, caffeine- 2-3 per day.   No regular exercise.   Allergies  Allergen Reactions  . Codeine Other (See Comments)    Hot flashes/passed out  . Pollen Extract Other (See Comments)    drainage   Family History  Problem Relation Age of Onset  . Arthritis Mother        DOB 89  . Cancer Mother        Thyroid cancer  . Hypertension Mother   . Atrial fibrillation Mother   . Melanoma Mother   . Breast cancer Mother   . Arthritis Father        DOB 1929  . Melanoma Father   . Colon polyps Neg Hx   . Rectal cancer Neg Hx   . Stomach cancer Neg Hx       Current Outpatient Medications (Respiratory):  .  fluticasone (FLONASE) 50 MCG/ACT nasal spray, Place 2  sprays into both nostrils daily.    Current Outpatient Medications (Other):  Marland Kitchen  Diclofenac Sodium 2 % SOLN, Place 2 g onto the skin 2 (two) times daily. Marland Kitchen  DIGESTIVE ENZYMES PO, Take 1 tablet by mouth every other day. .  metroNIDAZOLE (METROCREAM) 0.75 % cream,  .  Multiple Vitamin (MULTIVITAMIN WITH MINERALS) TABS tablet, Take 1 tablet by mouth daily. .  pantoprazole (PROTONIX) 40 MG tablet, Take 1 tablet (40 mg total) by mouth daily. .  sertraline (ZOLOFT) 50 MG tablet, Take 2 tablets (100 mg total) by mouth daily. Take 1 & 1/2 tablets by mouth once daily .  simethicone (MYLICON) 80 MG chewable tablet, Chew 80 mg by mouth every 6 (six) hours as needed for flatulence.    Past medical history, social, surgical and family history all reviewed in electronic medical record.  No pertanent information unless stated regarding to the chief complaint.   Review of Systems:  No headache, visual changes, nausea, vomiting, diarrhea, constipation, dizziness, abdominal pain, skin rash, fevers, chills, night sweats, weight loss, swollen lymph nodes, body aches, joint swelling, muscle aches, chest pain, shortness of breath, mood changes.   Objective  Blood pressure 130/80, pulse 80, height 5' 7.5" (1.715 m), weight 139 lb (63 kg), SpO2 98 %.    General: No apparent distress alert and oriented x3 mood and affect normal, dressed appropriately.  HEENT: Pupils equal, extraocular movements intact  Respiratory: Patient's speak in full sentences and does not appear short of breath  Cardiovascular: No lower extremity edema, non tender, no erythema  Skin: Warm dry intact with no signs of infection or rash on extremities or on axial skeleton.  Abdomen: Soft nontender  Neuro: Cranial nerves II through XII are intact, neurovascularly intact in all extremities with 2+ DTRs and 2+ pulses.  Lymph: No lymphadenopathy of posterior or anterior cervical chain or axillae bilaterally.  Gait normal with good balance and  coordination.  MSK:  Non tender with full range of motion and good stability and symmetric strength and tone of shoulders, elbows, wrist, hip, and ankles bilaterally.  Bilateral knee exam shows some mild lateral tracking.  Mild crepitus but improved.  Minimal tenderness to palpation though.  Full range of motion.  Good stability  of the knee otherwise.   Impression and Recommendations:     This case required medical decision making of moderate complexity. The above documentation has been reviewed and is accurate and complete Lyndal Pulley, DO       Note: This dictation was prepared with Dragon dictation along with smaller phrase technology. Any transcriptional errors that result from this process are unintentional.

## 2018-06-09 ENCOUNTER — Encounter: Payer: Self-pay | Admitting: Family Medicine

## 2018-06-09 ENCOUNTER — Ambulatory Visit: Payer: Federal, State, Local not specified - PPO | Admitting: Family Medicine

## 2018-06-09 DIAGNOSIS — M222X1 Patellofemoral disorders, right knee: Secondary | ICD-10-CM

## 2018-06-09 DIAGNOSIS — M222X2 Patellofemoral disorders, left knee: Secondary | ICD-10-CM

## 2018-06-09 NOTE — Patient Instructions (Signed)
Good to see you  Wear brace with a lot of activity  Ice 20 minutes 2 times daily. Usually after activity and before bed. [place] 3 or [place] are the 2 that could help  Also look at New balance 990 See me again in 4-6 weeks

## 2018-06-09 NOTE — Assessment & Plan Note (Signed)
Patellofemoral syndrome bilaterally.  Made a significant improvement already.  Discussed icing regimen and home exercise.  Discussed which activities to do which wants to avoid.  Follow-up again in 4 to 8 weeks

## 2018-06-23 DIAGNOSIS — Z1231 Encounter for screening mammogram for malignant neoplasm of breast: Secondary | ICD-10-CM | POA: Diagnosis not present

## 2018-06-23 DIAGNOSIS — Z01419 Encounter for gynecological examination (general) (routine) without abnormal findings: Secondary | ICD-10-CM | POA: Diagnosis not present

## 2018-06-23 DIAGNOSIS — Z6821 Body mass index (BMI) 21.0-21.9, adult: Secondary | ICD-10-CM | POA: Diagnosis not present

## 2018-06-23 DIAGNOSIS — Z1212 Encounter for screening for malignant neoplasm of rectum: Secondary | ICD-10-CM | POA: Diagnosis not present

## 2018-07-27 ENCOUNTER — Encounter: Payer: Self-pay | Admitting: Family Medicine

## 2018-07-27 ENCOUNTER — Ambulatory Visit: Payer: Federal, State, Local not specified - PPO | Admitting: Family Medicine

## 2018-07-27 DIAGNOSIS — M222X1 Patellofemoral disorders, right knee: Secondary | ICD-10-CM

## 2018-07-27 DIAGNOSIS — M222X2 Patellofemoral disorders, left knee: Secondary | ICD-10-CM | POA: Diagnosis not present

## 2018-07-27 NOTE — Assessment & Plan Note (Signed)
Discussed icing regimen and home exercises. Patient is well can follow-up as needed.

## 2018-07-27 NOTE — Patient Instructions (Signed)
Good to see you  You are doing great  Call [phone] whenever you need [place]! Happy New Year!

## 2018-07-27 NOTE — Progress Notes (Signed)
[name] [address], [place] [address] Phone: [phone] Subjective:    I Renee [name] am serving as a Education administrator for Dr. Hulan Saas.    CC: Bilateral knee pain follow-up  [id number]  Renee [name] is a 58 y.o. female coming in with complaint of bilateral knee pain. Patient states that her knees are doing well. Patient has been doing the exercises more religiously and taking the vitamin supplementation.  Feels that she is 85 to 90% better.  Some mild discomfort from time to time but nothing severe.  Happy with results so far.     Past Medical History:  Diagnosis Date  . Abnormal chest x-ray    CXR c/w COPD. Full PFTs '11 - normal  . Allergic rhinitis, cause unspecified   . Allergy    seasonal  . Anxiety   . Arthritis    both knees, post traumatic - neck  . Cataract    removed bilateral  . Depression    with anxiety.  . Depression   . Dyslipidemia   . Hx of colonic polyps 2008  . IBS (irritable bowel syndrome)   . 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 Migraines    during teenage years  . MVP (mitral valve prolapse)    last 2D echo approx '10  . Osteopenia    DEXA 03/2012: -1.0   Past Surgical History:  Procedure Laterality Date  . APPENDECTOMY    . BUNIONECTOMY WITH HAMMERTOE RECONSTRUCTION Left 07/06/13   Renee [name]  . CARDIAC CATHETERIZATION  2006   normal study  . CARPAL TUNNEL RELEASE  2011   right hand  . HYSTEROPLASTY REPAIR OF UTERINE ANOMALY     2003 - laproscopically; 2006 - laparotomy  . LAPAROSCOPIC ENDOMETRIOSIS FULGURATION     1996 and 2002  . MANDIBLE RECONSTRUCTION    . NASAL SEPTUM SURGERY  11/2012   Renee [name]  . TRIGGER FINGER RELEASE  2011   R thimb and ring finger  . TRIGGER FINGER RELEASE  02/04/2012   Renee [name], in office   Social History   Socioeconomic History  . Marital status: Married    Spouse name: Not on file  . Number of children: 0  . Years of education:  61  . Highest education level: Not on file  Occupational History  . 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 Financial resource strain: Not on file  . Food insecurity:    Worry: Not on file    Inability: Not on file  . Transportation needs:    Medical: Not on file    Non-medical: Not on file  Tobacco Use  . Smoking status: Never Smoker  . Smokeless tobacco: Never Used  Substance and Sexual Activity  . Alcohol use: Yes    Comment: social  . Drug use: No  . Sexual activity: Yes    Partners: Male  Lifestyle  . Physical activity:    Days per week: Not on file    Minutes per session: Not on file  . Stress: Not on file  Relationships  . Social connections:    Talks on phone: Not on file    Gets together: Not on file    Attends religious service: Not on file    Active member of club or organization: Not on file    Attends meetings of clubs or organizations: Not on file  Relationship status: Not on file  Other Topics Concern  . Not on file  Social History Narrative   Graduated from [employer] with degree in Food science/nutrition.   Married '94.   Denies any physical or sexual abuse.   Family in [place]: parents and sister.   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   Has smoke alarms, wears seatbelt at all times, uses helmut when appropriate, firearms present in the home, uses herbal remedies, caffeine- 2-3 per day.   No regular exercise.   Allergies  Allergen Reactions  . Codeine Other (See Comments)    Hot flashes/passed out  . Pollen Extract Other (See Comments)    drainage   Family History  Problem Relation Age of Onset  . Arthritis Mother        DOB 75  . Cancer Mother        Thyroid cancer  . Hypertension Mother   . Atrial fibrillation Mother   . Melanoma Mother   . Breast cancer Mother   . Arthritis Father        DOB 1929  . Melanoma Father   . Colon polyps Neg Hx   . Rectal cancer Neg Hx   . Stomach  cancer Neg Hx       Current Outpatient Medications (Respiratory):  .  fluticasone (FLONASE) 50 MCG/ACT nasal spray, Place 2 sprays into both nostrils daily.    Current Outpatient Medications (Other):  Marland Kitchen  Diclofenac Sodium 2 % SOLN, Place 2 g onto the skin 2 (two) times daily. Marland Kitchen  DIGESTIVE ENZYMES PO, Take 1 tablet by mouth every other day. .  metroNIDAZOLE (METROCREAM) 0.75 % cream,  .  Multiple Vitamin (MULTIVITAMIN WITH MINERALS) TABS tablet, Take 1 tablet by mouth daily. .  pantoprazole (PROTONIX) 40 MG tablet, Take 1 tablet (40 mg total) by mouth daily. .  sertraline (ZOLOFT) 50 MG tablet, Take 2 tablets (100 mg total) by mouth daily. Take 1 & 1/2 tablets by mouth once daily .  simethicone (MYLICON) 80 MG chewable tablet, Chew 80 mg by mouth every 6 (six) hours as needed for flatulence.    Past medical history, social, surgical and family history all reviewed in electronic medical record.  No pertanent information unless stated regarding to the chief complaint.   Review of Systems:  No headache, visual changes, nausea, vomiting, diarrhea, constipation, dizziness, abdominal pain, skin rash, fevers, chills, night sweats, weight loss, swollen lymph nodes, body aches, joint swelling, chest pain, shortness of breath, mood changes.  Positive muscle aches  Objective  Blood pressure 128/70, pulse 84, height 5' 7.5" (1.715 m), weight 144 lb (65.3 kg), SpO2 97 %.    General: No apparent distress alert and oriented x3 mood and affect normal, dressed appropriately.  HEENT: Pupils equal, extraocular movements intact  Respiratory: Patient's speak in full sentences and does not appear short of breath  Cardiovascular: No lower extremity edema, non tender, no erythema  Skin: Warm dry intact with no signs of infection or rash on extremities or on axial skeleton.  Abdomen: Soft nontender  Neuro: Cranial nerves II through XII are intact, neurovascularly intact in all extremities with 2+ DTRs and  2+ pulses.  Lymph: No lymphadenopathy of posterior or anterior cervical chain or axillae bilaterally.  Gait normal with good balance and coordination.  MSK:  Non tender with full range of motion and good stability and symmetric strength and tone of shoulders, elbows, wrist, hip, and ankles bilaterally.  Knee exams bilaterally show  some mild tenderness of the medial joint line and mild lateral tracking of the right patella but otherwise fairly unremarkable.    Impression and Recommendations:     The above documentation has been reviewed and is accurate and complete Renee Pulley, DO       Note: This dictation was prepared with Dragon dictation along with smaller phrase technology. Any transcriptional errors that result from this process are unintentional.

## 2018-08-20 DIAGNOSIS — Z809 Family history of malignant neoplasm, unspecified: Secondary | ICD-10-CM | POA: Diagnosis not present

## 2018-09-11 DIAGNOSIS — Z961 Presence of intraocular lens: Secondary | ICD-10-CM | POA: Diagnosis not present

## 2018-09-11 DIAGNOSIS — H52203 Unspecified astigmatism, bilateral: Secondary | ICD-10-CM | POA: Diagnosis not present

## 2018-09-11 DIAGNOSIS — H1789 Other corneal scars and opacities: Secondary | ICD-10-CM | POA: Diagnosis not present

## 2018-09-23 ENCOUNTER — Other Ambulatory Visit: Payer: Self-pay | Admitting: *Deleted

## 2019-02-02 DIAGNOSIS — E039 Hypothyroidism, unspecified: Secondary | ICD-10-CM | POA: Diagnosis not present

## 2019-02-02 DIAGNOSIS — R202 Paresthesia of skin: Secondary | ICD-10-CM | POA: Diagnosis not present

## 2019-02-02 DIAGNOSIS — R197 Diarrhea, unspecified: Secondary | ICD-10-CM | POA: Diagnosis not present

## 2019-02-03 DIAGNOSIS — D2262 Melanocytic nevi of left upper limb, including shoulder: Secondary | ICD-10-CM | POA: Diagnosis not present

## 2019-02-03 DIAGNOSIS — D2261 Melanocytic nevi of right upper limb, including shoulder: Secondary | ICD-10-CM | POA: Diagnosis not present

## 2019-02-03 DIAGNOSIS — L738 Other specified follicular disorders: Secondary | ICD-10-CM | POA: Diagnosis not present

## 2019-02-03 DIAGNOSIS — L570 Actinic keratosis: Secondary | ICD-10-CM | POA: Diagnosis not present

## 2019-04-14 ENCOUNTER — Ambulatory Visit (INDEPENDENT_AMBULATORY_CARE_PROVIDER_SITE_OTHER): Payer: Federal, State, Local not specified - PPO | Admitting: Internal Medicine

## 2019-04-14 ENCOUNTER — Other Ambulatory Visit: Payer: Self-pay

## 2019-04-14 ENCOUNTER — Encounter: Payer: Self-pay | Admitting: Internal Medicine

## 2019-04-14 ENCOUNTER — Other Ambulatory Visit (INDEPENDENT_AMBULATORY_CARE_PROVIDER_SITE_OTHER): Payer: Federal, State, Local not specified - PPO

## 2019-04-14 VITALS — BP 114/72 | HR 86 | Temp 98.6°F | Ht 67.5 in | Wt 147.0 lb

## 2019-04-14 DIAGNOSIS — Z23 Encounter for immunization: Secondary | ICD-10-CM | POA: Diagnosis not present

## 2019-04-14 DIAGNOSIS — Z0000 Encounter for general adult medical examination without abnormal findings: Secondary | ICD-10-CM

## 2019-04-14 DIAGNOSIS — Z808 Family history of malignant neoplasm of other organs or systems: Secondary | ICD-10-CM

## 2019-04-14 DIAGNOSIS — F3341 Major depressive disorder, recurrent, in partial remission: Secondary | ICD-10-CM

## 2019-04-14 LAB — CBC
HCT: 39.5 % (ref 36.0–46.0)
Hemoglobin: 13.2 g/dL (ref 12.0–15.0)
MCHC: 33.3 g/dL (ref 30.0–36.0)
MCV: 89.3 fl (ref 78.0–100.0)
Platelets: 199 10*3/uL (ref 150.0–400.0)
RBC: 4.42 Mil/uL (ref 3.87–5.11)
RDW: 13.2 % (ref 11.5–15.5)
WBC: 4.6 10*3/uL (ref 4.0–10.5)

## 2019-04-14 LAB — COMPREHENSIVE METABOLIC PANEL
ALT: 10 U/L (ref 0–35)
AST: 15 U/L (ref 0–37)
Albumin: 4.4 g/dL (ref 3.5–5.2)
Alkaline Phosphatase: 61 U/L (ref 39–117)
BUN: 14 mg/dL (ref 6–23)
CO2: 29 mEq/L (ref 19–32)
Calcium: 9.5 mg/dL (ref 8.4–10.5)
Chloride: 101 mEq/L (ref 96–112)
Creatinine, Ser: 1.01 mg/dL (ref 0.40–1.20)
GFR: 56.33 mL/min — ABNORMAL LOW (ref >60.00)
Glucose, Bld: 91 mg/dL (ref 70–99)
Potassium: 4.3 mEq/L (ref 3.5–5.1)
Sodium: 138 mEq/L (ref 135–145)
Total Bilirubin: 0.8 mg/dL (ref 0.2–1.2)
Total Protein: 7.2 g/dL (ref 6.0–8.3)

## 2019-04-14 LAB — LIPID PANEL
Cholesterol: 220 mg/dL — ABNORMAL HIGH (ref 0–200)
HDL: 74.5 mg/dL (ref >39.00)
LDL Cholesterol: 126 mg/dL — ABNORMAL HIGH (ref 0–99)
NonHDL: 145.13
Total CHOL/HDL Ratio: 3
Triglycerides: 94 mg/dL (ref 0.0–149.0)
VLDL: 18.8 mg/dL (ref 0.0–40.0)

## 2019-04-14 LAB — TSH: TSH: 2.66 u[IU]/mL (ref 0.35–4.50)

## 2019-04-14 LAB — VITAMIN D 25 HYDROXY (VIT D DEFICIENCY, FRACTURES): VITD: 28.49 ng/mL — ABNORMAL LOW (ref 30.00–100.00)

## 2019-04-14 MED ORDER — FLUTICASONE PROPIONATE 50 MCG/ACT NA SUSP: 2.0000 | Freq: Every day | NASAL | 3 refills | Status: DC

## 2019-04-14 NOTE — Progress Notes (Signed)
   Subjective:   Patient ID: Renee [name], female    DOB: 1961/04/16, 58 y.o.   MRN: [id number]  HPI The patient is a 58 YO female coming in for physical.   PMH, [place] [facility] [place], social history reviewed and updated  Review of Systems  Constitutional: Negative.   HENT: Negative.   Eyes: Negative.   Respiratory: Negative for cough, chest tightness and shortness of breath.   Cardiovascular: Negative for chest pain, palpitations and leg swelling.  Gastrointestinal: Negative for abdominal distention, abdominal pain, constipation, diarrhea, nausea and vomiting.  Musculoskeletal: Negative.   Skin: Negative.   Neurological: Negative.   Psychiatric/Behavioral: Negative.     Objective:  Physical Exam Constitutional:      Appearance: She is well-developed.  HENT:     Head: Normocephalic and atraumatic.  Neck:     Musculoskeletal: Normal range of motion.  Cardiovascular:     Rate and Rhythm: Normal rate and regular rhythm.  Pulmonary:     Effort: Pulmonary effort is normal. No respiratory distress.     Breath sounds: Normal breath sounds. No wheezing or rales.  Abdominal:     General: Bowel sounds are normal. There is no distension.     Palpations: Abdomen is soft.     Tenderness: There is no abdominal tenderness. There is no rebound.  Skin:    General: Skin is warm and dry.  Neurological:     Mental Status: She is alert and oriented to person, place, and time.     Coordination: Coordination normal.     Vitals:   04/14/19 1050  BP: 114/72  Pulse: 86  Temp: 98.6 F (37 C)  TempSrc: Oral  SpO2: 97%  Weight: 147 lb (66.7 kg)  Height: 5' 7.5" (1.715 m)    Assessment & Plan:  Flu shot given at visit

## 2019-04-14 NOTE — Patient Instructions (Addendum)
Health Maintenance, Female Adopting a healthy lifestyle and getting preventive care are important in promoting health and wellness. Ask your health care provider about:  The right schedule for you to have regular tests and exams.  Things you can do on your own to prevent diseases and keep yourself healthy. What should I know about diet, weight, and exercise? Eat a healthy diet   Eat a diet that includes plenty of vegetables, fruits, low-fat dairy products, and lean protein.  Do not eat a lot of foods that are high in solid fats, added sugars, or sodium. Maintain a healthy weight Body mass index (BMI) is used to identify weight problems. It estimates body fat based on height and weight. Your health care provider can help determine your BMI and help you achieve or maintain a healthy weight. Get regular exercise Get regular exercise. This is one of the most important things you can do for your health. Most adults should:  Exercise for at least 150 minutes each week. The exercise should increase your heart rate and make you sweat (moderate-intensity exercise).  Do strengthening exercises at least twice a week. This is in addition to the moderate-intensity exercise.  Spend less time sitting. Even light physical activity can be beneficial. Watch cholesterol and blood lipids Have your blood tested for lipids and cholesterol at 58 years of age, then have this test every 5 years. Have your cholesterol levels checked more often if:  Your lipid or cholesterol levels are high.  You are older than 58 years of age.  You are at high risk for heart disease. What should I know about cancer screening? Depending on your health history and family history, you may need to have cancer screening at various ages. This may include screening for:  Breast cancer.  Cervical cancer.  Colorectal cancer.  Skin cancer.  Lung cancer. What should I know about heart disease, diabetes, and high blood  pressure? Blood pressure and heart disease  High blood pressure causes heart disease and increases the risk of stroke. This is more likely to develop in people who have high blood pressure readings, are of African descent, or are overweight.  Have your blood pressure checked: ? Every 3-5 years if you are 18-39 years of age. ? Every year if you are 40 years old or older. Diabetes Have regular diabetes screenings. This checks your fasting blood sugar level. Have the screening done:  Once every three years after age 40 if you are at a normal weight and have a low risk for diabetes.  More often and at a younger age if you are overweight or have a high risk for diabetes. What should I know about preventing infection? Hepatitis B If you have a higher risk for hepatitis B, you should be screened for this virus. Talk with your health care provider to find out if you are at risk for hepatitis B infection. Hepatitis C Testing is recommended for:  Everyone born from 1945 through 1965.  Anyone with known risk factors for hepatitis C. Sexually transmitted infections (STIs)  Get screened for STIs, including gonorrhea and chlamydia, if: ? You are sexually active and are younger than 58 years of age. ? You are older than 58 years of age and your health care provider tells you that you are at risk for this type of infection. ? Your sexual activity has changed since you were last screened, and you are at increased risk for chlamydia or gonorrhea. Ask your health care provider if   you are at risk.  Ask your health care provider about whether you are at high risk for HIV. Your health care provider may recommend a prescription medicine to help prevent HIV infection. If you choose to take medicine to prevent HIV, you should first get tested for HIV. You should then be tested every 3 months for as long as you are taking the medicine. Pregnancy  If you are about to stop having your period (premenopausal) and  you may become pregnant, seek counseling before you get pregnant.  Take 400 to 800 micrograms (mcg) of folic acid every day if you become pregnant.  Ask for birth control (contraception) if you want to prevent pregnancy. Osteoporosis and menopause Osteoporosis is a disease in which the bones lose minerals and strength with aging. This can result in bone fractures. If you are 65 years old or older, or if you are at risk for osteoporosis and fractures, ask your health care provider if you should:  Be screened for bone loss.  Take a calcium or vitamin D supplement to lower your risk of fractures.  Be given hormone replacement therapy (HRT) to treat symptoms of menopause. Follow these instructions at home: Lifestyle  Do not use any products that contain nicotine or tobacco, such as cigarettes, e-cigarettes, and chewing tobacco. If you need help quitting, ask your health care provider.  Do not use street drugs.  Do not share needles.  Ask your health care provider for help if you need support or information about quitting drugs. Alcohol use  Do not drink alcohol if: ? Your health care provider tells you not to drink. ? You are pregnant, may be pregnant, or are planning to become pregnant.  If you drink alcohol: ? Limit how much you use to 0-1 drink a day. ? Limit intake if you are breastfeeding.  Be aware of how much alcohol is in your drink. In the [place] one drink equals one 12 oz bottle of beer (355 mL), one 5 oz glass of wine (148 mL), or one 1 oz glass of hard liquor (44 mL). General instructions  Schedule regular health, dental, and eye exams.  Stay current with your vaccines.  Tell your health care provider if: ? You often feel depressed. ? You have ever been abused or do not feel safe at home. Summary  Adopting a healthy lifestyle and getting preventive care are important in promoting health and wellness.  Follow your health care provider's instructions about healthy  diet, exercising, and getting tested or screened for diseases.  Follow your health care provider's instructions on monitoring your cholesterol and blood pressure. This information is not intended to replace advice given to you by your health care provider. Make sure you discuss any questions you have with your health care provider. Document Released: 12/31/2010 Document Revised: 06/10/2018 Document Reviewed: 06/10/2018 Elsevier Patient Education  2020 Elsevier Inc.  

## 2019-04-15 NOTE — Assessment & Plan Note (Signed)
Checking TSH and exam normal.

## 2019-04-15 NOTE — Assessment & Plan Note (Signed)
Flu shot given. Shingrix counseled. Tetanus up to date. Colonoscopy up to date. Mammogram up to date, pap smear up to date. Counseled about sun safety and mole surveillance. Counseled about the dangers of distracted driving. Given 10 year screening recommendations.   

## 2019-04-15 NOTE — Assessment & Plan Note (Signed)
Stable on current dosing but may need increase as family are planning to move in for unspecified amount of time toward the end of year. Coping well with pandemic.

## 2019-04-18 ENCOUNTER — Other Ambulatory Visit: Payer: Self-pay | Admitting: Internal Medicine

## 2019-06-29 ENCOUNTER — Other Ambulatory Visit: Payer: Self-pay

## 2019-06-29 ENCOUNTER — Encounter: Payer: Self-pay | Admitting: Internal Medicine

## 2019-06-29 ENCOUNTER — Ambulatory Visit (INDEPENDENT_AMBULATORY_CARE_PROVIDER_SITE_OTHER): Payer: Federal, State, Local not specified - PPO | Admitting: Internal Medicine

## 2019-06-29 VITALS — BP 118/82 | HR 88 | Temp 98.8°F | Ht 67.5 in | Wt 146.0 lb

## 2019-06-29 DIAGNOSIS — M79602 Pain in left arm: Secondary | ICD-10-CM | POA: Insufficient documentation

## 2019-06-29 DIAGNOSIS — M25512 Pain in left shoulder: Secondary | ICD-10-CM

## 2019-06-29 MED ORDER — KETOROLAC TROMETHAMINE 30 MG/ML IJ SOLN
30.0000 mg | Freq: Once | INTRAMUSCULAR | Status: AC
  Administered 2019-06-29: 30 mg via INTRAMUSCULAR

## 2019-06-29 MED ORDER — METHYLPREDNISOLONE ACETATE 40 MG/ML IJ SUSP
40.0000 mg | Freq: Once | INTRAMUSCULAR | Status: AC
  Administered 2019-06-29: 40 mg via INTRAMUSCULAR

## 2019-06-29 MED ORDER — CYCLOBENZAPRINE HCL 5 MG PO TABS: 5.0000 mg | ORAL_TABLET | Freq: Three times a day (TID) | ORAL | 1 refills | Status: DC | PRN

## 2019-06-29 NOTE — Patient Instructions (Signed)
The EKG of the heart looks normal and the same from the one last year.   We have given you a toradol shot and a steroid shot to help the shoulder improve.   We have sent in flexeril to use in the meantime up to 3 times a day for pain as needed. It is okay to take tylenol or advil in addition to this as they will not interfere with each other.

## 2019-06-29 NOTE — Assessment & Plan Note (Signed)
EKG done to rule out cardiac etiology which was unchanged from prior. Given toradol 30 mg IM and depo-medrol 40 mg IM today. Rx flexeril for pain. Call back if not improving or worsening.

## 2019-06-29 NOTE — Progress Notes (Signed)
   Subjective:   Patient ID: Renee [name], female    DOB: 10/11/1960, 58 y.o.   MRN: [id number]  HPI The patient is a 58 YO female coming in for left chest/shoulder/arm pain starting 06/24/19. She has had bursitis in that shoulder in the past over 10 years. Never this bad. Pain 10/10 constant since Christmas. She has tried advil and ice and that has not helped. Took some old methocarbamol (60+ years old) and this helped take the edge off. She denies fevers or chills. Denies cough or SOB. Pain does not change with activity. Pain is constant. Starts in the shoulder and radiates into left chest and left arm. Having some tingling in the left fingers since yesterday. Overall worsening. Has tried advil and ice.   Review of Systems  Constitutional: Negative.   HENT: Negative.   Eyes: Negative.   Respiratory: Negative for cough, chest tightness and shortness of breath.   Cardiovascular: Negative for chest pain, palpitations and leg swelling.  Gastrointestinal: Negative for abdominal distention, abdominal pain, constipation, diarrhea, nausea and vomiting.  Musculoskeletal: Positive for arthralgias and myalgias.  Skin: Negative.   Neurological: Positive for numbness.  Psychiatric/Behavioral: Negative.     Objective:  Physical Exam Constitutional:      Appearance: She is well-developed.  HENT:     Head: Normocephalic and atraumatic.  Cardiovascular:     Rate and Rhythm: Normal rate and regular rhythm.  Pulmonary:     Effort: Pulmonary effort is normal. No respiratory distress.     Breath sounds: Normal breath sounds. No wheezing or rales.  Abdominal:     General: Bowel sounds are normal. There is no distension.     Palpations: Abdomen is soft.     Tenderness: There is no abdominal tenderness. There is no rebound.  Musculoskeletal:        General: Tenderness present.     Cervical back: Normal range of motion.     Comments: Pain left chest wall some worse with palpation and in left AC and  shoulder/upper back  Skin:    General: Skin is warm and dry.  Neurological:     Mental Status: She is alert and oriented to person, place, and time.     Coordination: Coordination normal.     Vitals:   06/29/19 0907  BP: 118/82  Pulse: 88  Temp: 98.8 F (37.1 C)  TempSrc: Oral  SpO2: 96%  Weight: 146 lb (66.2 kg)  Height: 5' 7.5" (1.715 m)   EKG: Rate 72, axis normal, intervals normal, RBBB, sinus, no st or t wave changes, no significant change compared to prior 2019  This visit occurred during the SARS-CoV-2 public health emergency.  Safety protocols were in place, including screening questions prior to the visit, additional usage of staff PPE, and extensive cleaning of exam room while observing appropriate contact time as indicated for disinfecting solutions.   Assessment & Plan:  Toradol 30 mg IM and depo-medrol 40 mg IM given at visit  Visit time 25 minutes: greater than 50% of that time was spent in face to face counseling and coordination of care with the patient: counseled about etiology, EKG findings, regimen for healing and plan if not improving.

## 2019-06-30 DIAGNOSIS — B029 Zoster without complications: Secondary | ICD-10-CM | POA: Diagnosis not present

## 2019-06-30 DIAGNOSIS — L570 Actinic keratosis: Secondary | ICD-10-CM | POA: Diagnosis not present

## 2019-06-30 DIAGNOSIS — L239 Allergic contact dermatitis, unspecified cause: Secondary | ICD-10-CM | POA: Diagnosis not present

## 2019-07-04 ENCOUNTER — Encounter: Payer: Self-pay | Admitting: Internal Medicine

## 2019-07-07 ENCOUNTER — Encounter: Payer: Self-pay | Admitting: Internal Medicine

## 2019-07-14 DIAGNOSIS — B029 Zoster without complications: Secondary | ICD-10-CM | POA: Diagnosis not present

## 2019-09-14 DIAGNOSIS — H1789 Other corneal scars and opacities: Secondary | ICD-10-CM | POA: Diagnosis not present

## 2019-09-14 DIAGNOSIS — H52203 Unspecified astigmatism, bilateral: Secondary | ICD-10-CM | POA: Diagnosis not present

## 2019-09-14 DIAGNOSIS — Z961 Presence of intraocular lens: Secondary | ICD-10-CM | POA: Diagnosis not present

## 2019-09-23 DIAGNOSIS — Z01419 Encounter for gynecological examination (general) (routine) without abnormal findings: Secondary | ICD-10-CM | POA: Diagnosis not present

## 2019-09-23 DIAGNOSIS — Z1231 Encounter for screening mammogram for malignant neoplasm of breast: Secondary | ICD-10-CM | POA: Diagnosis not present

## 2019-09-28 ENCOUNTER — Other Ambulatory Visit: Payer: Self-pay | Admitting: Obstetrics and Gynecology

## 2019-09-28 DIAGNOSIS — R928 Other abnormal and inconclusive findings on diagnostic imaging of breast: Secondary | ICD-10-CM

## 2019-10-06 ENCOUNTER — Ambulatory Visit
Admission: RE | Admit: 2019-10-06 | Discharge: 2019-10-06 | Disposition: A | Discharge status: Home / Self Care | Payer: Federal, State, Local not specified - PPO | Source: Ambulatory Visit | Attending: Obstetrics and Gynecology | Admitting: Obstetrics and Gynecology

## 2019-10-06 ENCOUNTER — Other Ambulatory Visit: Payer: Self-pay

## 2019-10-06 DIAGNOSIS — N6489 Other specified disorders of breast: Secondary | ICD-10-CM | POA: Diagnosis not present

## 2019-10-06 DIAGNOSIS — R928 Other abnormal and inconclusive findings on diagnostic imaging of breast: Secondary | ICD-10-CM

## 2019-10-06 DIAGNOSIS — R922 Inconclusive mammogram: Secondary | ICD-10-CM | POA: Diagnosis not present

## 2019-10-28 DIAGNOSIS — Z13820 Encounter for screening for osteoporosis: Secondary | ICD-10-CM | POA: Diagnosis not present

## 2020-01-13 ENCOUNTER — Ambulatory Visit: Payer: Federal, State, Local not specified - PPO | Admitting: Internal Medicine

## 2020-01-13 ENCOUNTER — Encounter: Payer: Self-pay | Admitting: Internal Medicine

## 2020-01-13 ENCOUNTER — Other Ambulatory Visit: Payer: Self-pay

## 2020-01-13 DIAGNOSIS — M25511 Pain in right shoulder: Secondary | ICD-10-CM

## 2020-01-13 MED ORDER — PREDNISONE 20 MG PO TABS: 40.0000 mg | ORAL_TABLET | Freq: Every day | ORAL | 0 refills | Status: DC

## 2020-01-13 NOTE — Progress Notes (Signed)
   Subjective:   Patient ID: Renee [name], female    DOB: 1961-04-19, 59 y.o.   MRN: [id number]  HPI The patient is a 59 YO female coming in for right shoulder pain. Started 2-3 weeks ago. It started about 1-2 weeks after starting a pilates class once a week. She is also caring for grandchildren all the time as they are living with her including a 59 YO who she lifts from time to time. Purse is on that shoulder and is large. She has started carrying this on the other shoulder temporarily. Pain is mostly in the shoulder itself. Not limiting ROM and not with particular movements. Denies numbness or weakness in the right arm. Has tried aleve rarely and this helps when used. Overall it is stable.  Review of Systems  Constitutional: Negative.   HENT: Negative.   Eyes: Negative.   Respiratory: Negative for cough, chest tightness and shortness of breath.   Cardiovascular: Negative for chest pain, palpitations and leg swelling.  Gastrointestinal: Negative for abdominal distention, abdominal pain, constipation, diarrhea, nausea and vomiting.  Musculoskeletal: Positive for arthralgias and myalgias. Negative for gait problem and joint swelling.  Skin: Negative.   Neurological: Negative.   Psychiatric/Behavioral: Negative.     Objective:  Physical Exam Constitutional:      Appearance: She is well-developed.  HENT:     Head: Normocephalic and atraumatic.  Cardiovascular:     Rate and Rhythm: Normal rate and regular rhythm.  Pulmonary:     Effort: Pulmonary effort is normal. No respiratory distress.     Breath sounds: Normal breath sounds. No wheezing or rales.  Abdominal:     General: Bowel sounds are normal. There is no distension.     Palpations: Abdomen is soft.     Tenderness: There is no abdominal tenderness. There is no rebound.  Musculoskeletal:        General: Tenderness present.     Cervical back: Normal range of motion.     Comments: Tenderness at the right [facility] joint without  limitation of ROM or significant change in pain with ROM.  Skin:    General: Skin is warm and dry.  Neurological:     Mental Status: She is alert and oriented to person, place, and time.     Coordination: Coordination normal.     Vitals:   01/13/20 1434  BP: 120/78  Pulse: 73  Temp: 98 F (36.7 C)  TempSrc: Oral  SpO2: 97%  Weight: 144 lb (65.3 kg)  Height: 5' 7.5" (1.715 m)    This visit occurred during the SARS-CoV-2 public health emergency.  Safety protocols were in place, including screening questions prior to the visit, additional usage of staff PPE, and extensive cleaning of exam room while observing appropriate contact time as indicated for disinfecting solutions.   Assessment & Plan:

## 2020-01-13 NOTE — Patient Instructions (Signed)
We have sent in the prednisone to take 2 pills daily for 4 days.   Let us know if this is not helping and we will get you back in with Dr. Tamala Julian.

## 2020-01-14 DIAGNOSIS — M25511 Pain in right shoulder: Secondary | ICD-10-CM | POA: Insufficient documentation

## 2020-01-14 NOTE — Assessment & Plan Note (Signed)
Given short course prednisone and advised to take aleve regular for 1-2 weeks. Change purse shoulder or change to smaller bag. Can continue pilates but limit direct lifting with that arm until better. If no improvement 1-2 weeks needs referral to sports medicine for joint injection for likely bursitis.

## 2020-02-09 ENCOUNTER — Encounter: Payer: Self-pay | Admitting: Internal Medicine

## 2020-02-09 DIAGNOSIS — M25511 Pain in right shoulder: Secondary | ICD-10-CM

## 2020-02-17 ENCOUNTER — Encounter: Payer: Self-pay | Admitting: Family Medicine

## 2020-02-17 ENCOUNTER — Ambulatory Visit: Payer: Self-pay

## 2020-02-17 ENCOUNTER — Other Ambulatory Visit: Payer: Self-pay

## 2020-02-17 ENCOUNTER — Ambulatory Visit: Payer: Federal, State, Local not specified - PPO | Admitting: Family Medicine

## 2020-02-17 VITALS — BP 102/70 | HR 81 | Ht 67.5 in | Wt 148.0 lb

## 2020-02-17 DIAGNOSIS — M75111 Incomplete rotator cuff tear or rupture of right shoulder, not specified as traumatic: Secondary | ICD-10-CM

## 2020-02-17 DIAGNOSIS — M25511 Pain in right shoulder: Secondary | ICD-10-CM | POA: Diagnosis not present

## 2020-02-17 MED ORDER — VITAMIN D (ERGOCALCIFEROL) 1.25 MG (50000 UNIT) PO CAPS: 50000.0000 [IU] | ORAL_CAPSULE | ORAL | 0 refills | Status: DC

## 2020-02-17 NOTE — Assessment & Plan Note (Signed)
Partial tear noted.  Patient elected to try conservative therapy, referred to formal physical therapy, icing regimen, home exercise, increase activity slowly.  Discussed which activities to do which wants to avoid.  Patient will follow up again in 6 weeks.  At that time if worsening symptoms I would like to consider the possibility of an injection.  We will need x-rays at that time as well.  Hopefully will do well with conservative therapy

## 2020-02-17 NOTE — Progress Notes (Signed)
[place] Phone: [phone] Subjective:   Renee [name], am serving as a scribe for Dr. Hulan Saas. This visit occurred during the SARS-CoV-2 public health emergency.  Safety protocols were in place, including screening questions prior to the visit, additional usage of staff PPE, and extensive cleaning of exam room while observing appropriate contact time as indicated for disinfecting solutions.  I'm seeing this patient by the request  of:  [name], MD  CC: Right shoulder pain  [id number]  Renee [name] is a 59 y.o. female coming in with complaint of right shoulder pain. Last seen on 07/27/2018 for knee pain. Patient states that her pain is anterior but deep. [name] injury. Patient given prednisone by PCP. One week after finishing course pain came back. Is doing pilates which can aggravate shoulder. Pain with IR and flexion that radiates down into upper arm. Denies any numbness or tingling.      Past Medical History:  Diagnosis Date  . Abnormal chest x-ray    CXR c/w COPD. Full PFTs '11 - normal  . Allergic rhinitis, cause unspecified   . Allergy    seasonal  . Anxiety   . Arthritis    both knees, post traumatic - neck  . Cataract    removed bilateral  . Depression    with anxiety.  . Depression   . Dyslipidemia   . Hx of colonic polyps 2008  . IBS (irritable bowel syndrome)   . 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 Migraines    during teenage years  . MVP (mitral valve prolapse)    last 2D echo approx '10  . Osteopenia    DEXA 03/2012: -1.0   Past Surgical History:  Procedure Laterality Date  . APPENDECTOMY    . BUNIONECTOMY WITH HAMMERTOE RECONSTRUCTION Left 07/06/13   [place]  . CARDIAC CATHETERIZATION  2006   normal study  . CARPAL TUNNEL RELEASE  2011   right hand  . HYSTEROPLASTY REPAIR OF UTERINE ANOMALY     2003 - laproscopically; 2006 -  laparotomy  . LAPAROSCOPIC ENDOMETRIOSIS FULGURATION     1996 and 2002  . MANDIBLE RECONSTRUCTION    . NASAL SEPTUM SURGERY  11/2012   [name]  . TRIGGER FINGER RELEASE  2011   R thimb and ring finger  . TRIGGER FINGER RELEASE  02/04/2012   [place], in office   Social History   Socioeconomic History  . Marital status: Married    Spouse name: Not on file  . Number of children: 0  . Years of education: 78  . Highest education level: Not on file  Occupational History  . 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 Smoking status: Never Smoker  . Smokeless tobacco: Never Used  Substance and Sexual Activity  . Alcohol use: Yes    Comment: social  . Drug use: [name]  . Sexual activity: Yes    Partners: Male  Other Topics Concern  . Not on file  Social History Narrative   Graduated from [employer] with degree in Food science/nutrition.   Married '94.   Denies any physical or sexual abuse.   Family in [place]: parents and sister.   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   Has smoke alarms, wears seatbelt at all times, uses helmut when appropriate, firearms present in the home, uses herbal  remedies, caffeine- 2-3 per day.   [name] regular exercise.   Social Determinants of Health   Financial Resource Strain:   . Difficulty of Paying Living Expenses: Not on file  Food Insecurity:   . Worried About Charity fundraiser in the Last Year: Not on file  . Ran Out of Food in the Last Year: Not on file  Transportation Needs:   . Lack of Transportation (Medical): Not on file  . Lack of Transportation (Non-Medical): Not on file  Physical Activity:   . Days of Exercise per Week: Not on file  . Minutes of Exercise per Session: Not on file  Stress:   . Feeling of Stress : Not on file  Social Connections:   . Frequency of Communication with Friends and Family: Not on file  . Frequency of Social Gatherings with Friends and Family: Not on file  . Attends  Religious Services: Not on file  . Active Member of Clubs or Organizations: Not on file  . Attends Archivist Meetings: Not on file  . Marital Status: Not on file   Allergies  Allergen Reactions  . Codeine Other (See Comments)    Hot flashes/passed out  . Pollen Extract Other (See Comments)    drainage   Family History  Problem Relation Age of Onset  . Arthritis Mother        DOB 34  . Cancer Mother        Thyroid cancer  . Hypertension Mother   . Atrial fibrillation Mother   . Melanoma Mother   . Breast cancer Mother   . Arthritis Father        DOB 1929  . Melanoma Father   . Colon polyps Neg Hx   . Rectal cancer Neg Hx   . Stomach cancer Neg Hx     Current Outpatient Medications (Endocrine & Metabolic):  .  predniSONE (DELTASONE) 20 MG tablet, Take 2 tablets (40 mg total) by mouth daily with breakfast.   Current Outpatient Medications (Respiratory):  .  fluticasone (FLONASE) 50 MCG/ACT nasal spray, Place 2 sprays into both nostrils daily.    Current Outpatient Medications (Other):  .  cyclobenzaprine (FLEXERIL) 5 MG tablet, Take 1 tablet (5 mg total) by mouth 3 (three) times daily as needed for muscle spasms. Marland Kitchen  DIGESTIVE ENZYMES PO, Take 1 tablet by mouth every other day. .  metroNIDAZOLE (METROCREAM) 0.75 % cream,  .  Multiple Vitamin (MULTIVITAMIN WITH MINERALS) TABS tablet, Take 1 tablet by mouth daily. .  pantoprazole (PROTONIX) 40 MG tablet, Take 1 tablet (40 mg total) by mouth daily. .  sertraline (ZOLOFT) 50 MG tablet, TAKE 2 TABLETS BY MOUTH DAILY. .  simethicone (MYLICON) 80 MG chewable tablet, Chew 80 mg by mouth every 6 (six) hours as needed for flatulence. .  Vitamin D, Ergocalciferol, (DRISDOL) 1.25 MG (50000 UNIT) CAPS capsule, Take 1 capsule (50,000 Units total) by mouth every 7 (seven) days.   Reviewed prior external information including notes and imaging from  primary care provider As well as notes that were available from care  everywhere and other healthcare systems.  Past medical history, social, surgical and family history all reviewed in electronic medical record.  [name] pertanent information unless stated regarding to the chief complaint.   Review of Systems:  [name] headache, visual changes, nausea, vomiting, diarrhea, constipation, dizziness, abdominal pain, skin rash, fevers, chills, night sweats, weight loss, swollen lymph nodes, body aches, joint swelling, chest pain, shortness of breath, mood  changes. POSITIVE muscle aches  Objective  Blood pressure 102/70, pulse 81, height 5' 7.5" (1.715 m), weight 148 lb (67.1 kg), SpO2 97 %.   General: [name] apparent distress alert and oriented x3 mood and affect normal, dressed appropriately.  HEENT: Pupils equal, extraocular movements intact  Respiratory: Patient's speak in full sentences and does not appear short of breath  Cardiovascular: [name] lower extremity edema, non tender, [name] erythema  Neuro: Cranial nerves II through XII are intact, neurovascularly intact in all extremities with 2+ DTRs and 2+ pulses.  Gait normal with good balance and coordination.  MSK:  Non tender with full range of motion and good stability and symmetric strength and tone of  elbows, wrist, hip, knee and ankles bilaterally.   Right shoulder exam shows the patient does have some palpation diffusely.  Positive speeds test.  Patient does have positive impingement with Neer and Hawkins.  4+ out of 5 strength of the rotator cuff compared to 5 out of 5 on the contralateral side.  Full range of motion noted .  Limited musculoskeletal ultrasound performed and interpreted by Lyndal Pulley  Limited ultrasound shows the patient does have a very small partial tear of the supraspinatus on the anterior aspect.  Patient also has hypoechoic changes of the bicep tendon near the insertion on the anterior labrum that is approach appreciated.  Acromioclavicular appears to be unremarkable.  Does have some osteopenia  noted Impression: Partial tear of rotator cuff with capsulitis and bicep tendinitis   Impression and Recommendations:     The above documentation has been reviewed and is accurate and complete Lyndal Pulley, DO       Note: This dictation was prepared with Dragon dictation along with smaller phrase technology. Any transcriptional errors that result from this process are unintentional.

## 2020-02-17 NOTE — Patient Instructions (Signed)
Once weekly Vit D PT [address] Small partial tear of supraspinatus/thickening capsule, bicep tendonitis Pennsaid Arm compression sleeve Hands within peripheral vision See me in 6 weeks

## 2020-02-28 ENCOUNTER — Encounter: Payer: Self-pay | Admitting: Physical Therapy

## 2020-02-28 ENCOUNTER — Other Ambulatory Visit: Payer: Self-pay

## 2020-02-28 ENCOUNTER — Other Ambulatory Visit: Payer: Self-pay | Admitting: Endocrinology

## 2020-02-28 ENCOUNTER — Ambulatory Visit: Payer: Federal, State, Local not specified - PPO | Attending: Family Medicine | Admitting: Physical Therapy

## 2020-02-28 DIAGNOSIS — M6281 Muscle weakness (generalized): Secondary | ICD-10-CM | POA: Diagnosis not present

## 2020-02-28 DIAGNOSIS — G8929 Other chronic pain: Secondary | ICD-10-CM | POA: Diagnosis not present

## 2020-02-28 DIAGNOSIS — Z808 Family history of malignant neoplasm of other organs or systems: Secondary | ICD-10-CM

## 2020-02-28 DIAGNOSIS — R202 Paresthesia of skin: Secondary | ICD-10-CM | POA: Diagnosis not present

## 2020-02-28 DIAGNOSIS — M25511 Pain in right shoulder: Secondary | ICD-10-CM | POA: Diagnosis not present

## 2020-02-28 DIAGNOSIS — R002 Palpitations: Secondary | ICD-10-CM | POA: Diagnosis not present

## 2020-02-28 DIAGNOSIS — E039 Hypothyroidism, unspecified: Secondary | ICD-10-CM | POA: Diagnosis not present

## 2020-02-28 NOTE — Therapy (Signed)
[place], [place], [address] Phone: [phone]   Fax:  [phone]  Physical Therapy Evaluation  Patient Details  Name: Renee [name] MRN: [id number] Date of Birth: March 03, 1961 Referring Provider (PT): Lyndal Pulley, DO    Encounter Date: 02/28/2020   PT End of Session - 02/28/20 0933    Visit Number 1    Number of Visits 13    Date for PT Re-Evaluation 04/10/20    Authorization Type BCBS    PT Start Time 0933    PT Stop Time 1014    PT Time Calculation (min) 41 min    Activity Tolerance Patient tolerated treatment well    Behavior During Therapy [facility] for tasks assessed/performed           Past Medical History:  Diagnosis Date  . Abnormal chest x-ray    CXR c/w COPD. Full PFTs '11 - normal  . Allergic rhinitis, cause unspecified   . Allergy    seasonal  . Anxiety   . Arthritis    both knees, post traumatic - neck  . Cataract    removed bilateral  . Depression    with anxiety.  . Depression   . Dyslipidemia   . Hx of colonic polyps 2008  . IBS (irritable bowel syndrome)   . 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 Migraines    during teenage years  . MVP (mitral valve prolapse)    last 2D echo approx '10  . Osteopenia    DEXA 03/2012: -1.0    Past Surgical History:  Procedure Laterality Date  . APPENDECTOMY    . BUNIONECTOMY WITH HAMMERTOE RECONSTRUCTION Left 07/06/13   [facility]  . CARDIAC CATHETERIZATION  2006   normal study  . CARPAL TUNNEL RELEASE  2011   right hand  . HYSTEROPLASTY REPAIR OF UTERINE ANOMALY     2003 - laproscopically; 2006 - laparotomy  . LAPAROSCOPIC ENDOMETRIOSIS FULGURATION     1996 and 2002  . MANDIBLE RECONSTRUCTION    . NASAL SEPTUM SURGERY  11/2012   [name]  . TRIGGER FINGER RELEASE  2011   R thimb and ring finger  . TRIGGER FINGER RELEASE  02/04/2012   Gramig, in office    There were no vitals filed for this  visit.    Subjective Assessment - 02/28/20 0937    Subjective pt is a 59 y.o with CC of R shoulder pain that started about 6 months ago with no specific onset that she can remember outside of picking up her grandchild. She reports she started seeing the MD about a month ago.  She reports the pain has improved alilttle but notes it is sitll shoulder with intermittent referral to the elbow, and reports no PMhx of R shoulder problems    Limitations Lifting    How long can you sit comfortably? unlimited    How long can you stand comfortably? unlimited    How long can you walk comfortably? unlimited    Diagnostic tests 02/17/2020 [place], Limited musculoskeletal ultrasound performed and interpreted by Lyndal Pulley Limited ultrasound shows the patient does have a very small partial tear of the supraspinatus on the anterior aspect.  Patient also has hypoechoic changes of the bicep tendon near the insertion on the anterior labrum that is approach appreciated.  Acromioclavicular appears to be unremarkable.  Does have some osteopenia notedImpression: Partial tear of rotator cuff with capsulitis and bicep tendinitis  Patient Stated Goals strengthen the shoulder, decrease pain, returning to gardening and other personal goals.    Currently in Pain? Yes    Pain Score 4    at worst 8/10   Pain Location Shoulder    Pain Orientation Right;Anterior;Lateral    Pain Descriptors / Indicators Aching;Sore   pulling   Pain Type Chronic pain    Pain Radiating Towards to the elbow    Pain Onset More than a month ago    Pain Frequency Intermittent    Aggravating Factors  lifting, vacuuming, reaching behind the back.    Pain Relieving Factors ibuprofen PRN,    Effect of Pain on Daily Activities limited lifting, strength              [facility] PT Assessment - 02/28/20 0947      Assessment   Medical Diagnosis Acute pain of right shoulder M25.511    Referring Provider (PT) Lyndal Pulley, DO     Onset Date/Surgical  Date --   6 months ago   Hand Dominance Right    Next MD Visit --   7 weeks   Prior Therapy yes   for knee     Precautions   Precaution Comments avoiding lifting over head, keep hands in peripheral vision      Restrictions   Weight Bearing Restrictions No      Balance Screen   Has the patient fallen in the past 6 months No    Has the patient had a decrease in activity level because of a fear of falling?  No    Is the patient reluctant to leave their home because of a fear of falling?  No      Home Ecologist residence    Living Arrangements Spouse/significant other;Other relatives    Available Help at Discharge Family    Type of [place] to enter    Entrance Stairs-Number of Steps 3    Entrance Stairs-Rails Right   ascending   Home Layout Two level    Alternate Level Stairs-Number of Steps 12    Alternate Level Stairs-Rails Left   ascending   Home Equipment None      Prior Function   Level of Independence Independent with basic ADLs    Vocation Retired    Leisure gardening, spending time with grand children, working on Pensions consultant   Overall Cognitive Status Within [place] for tasks assessed      Observation/Other Assessments   Focus on Therapeutic Outcomes (FOTO)  38% limited   predicted 31% limited     ROM / Strength   AROM / PROM / Strength AROM;Strength;PROM      AROM   AROM Assessment Site Shoulder    Right/Left Shoulder Right;Left    Right Shoulder Extension 60 Degrees    Right Shoulder Flexion 120 Degrees   end range pain   Right Shoulder ABduction 105 Degrees   end range pain   Right Shoulder Internal Rotation --   T7 - end range pain    Right Shoulder External Rotation --   T5 - end range pain   Left Shoulder Extension 80 Degrees    Left Shoulder Flexion 148 Degrees    Left Shoulder ABduction 144 Degrees    Left Shoulder Internal Rotation --   T5   Left Shoulder External  Rotation --   T4     PROM  Overall PROM  Within functional limits for tasks performed    PROM Assessment Site Shoulder    Right/Left Shoulder Right;Left      Strength   Strength Assessment Site Shoulder;Hand    Right/Left Shoulder Right;Left    Right Shoulder Flexion 4/5   mild pain during testing    Right Shoulder Extension 5/5    Right Shoulder ABduction 4/5   pain during testing    Right Shoulder Internal Rotation 4-/5   pain during testing   Right Shoulder External Rotation 4-/5   mild pain during testing    Left Shoulder Flexion 4+/5    Left Shoulder Extension 5/5    Left Shoulder ABduction 4+/5    Left Shoulder Internal Rotation 4+/5    Left Shoulder External Rotation 4+/5    Right Hand Grip (lbs) 55   54,56,55   Left Hand Grip (lbs) 44   45,42,45     Palpation   Palpation comment TTP along the long head of the biceps brachii, suprasinatus soreness at the lesser tubercle. multiple trigger points in the R upper trap/ levator scapuale. specifc tenderness noted along the anterior GHJ between the coracoid procress and lesser tubercle      Special Tests    Special Tests Rotator Cuff Impingement    Rotator Cuff Impingment tests Michel Bickers test;Lift- off test;Empty Can test;Full Can test      Hawkins-Kennedy test   Findings Positive    Side Right      Lift-Off test   Findings Positive    Side Right      Empty Can test   Findings Positive    Side Right      Full Can test   Findings Negative    Side Right                      Objective measurements completed on examination: See above findings.               PT Education - 02/28/20 0946    Education Details evaluation findings, POC, goals, HEP with proper form/ rationale.    Person(s) Educated Patient    Methods Explanation;Verbal cues;Handout    Comprehension Verbalized understanding;Verbal cues required            PT Short Term Goals - 02/28/20 1134      PT SHORT TERM GOAL #1    Title pt to be I with initial HEP    Time 3    Period Weeks    Status New    Target Date 03/20/20             PT Long Term Goals - 02/28/20 1134      PT LONG TERM GOAL #1   Title increase R shulder flexion/ abduction to >/= 130 degrees for functional ROM required for ADLs    Time 6    Period Weeks    Status New    Target Date 04/10/20      PT LONG TERM GOAL #2   Title pt increase R shoulder strength to >/= 4+/5 to assist with shoulder stability with </= 2/10 pain during testing    Time 6    Period Weeks    Status New    Target Date 04/10/20      PT LONG TERM GOAL #3   Title pt to be able to lift/ lower >/= 10# to/from and overhead shelf, and to/from floor to waist with </= 2/10 pain for  pt's goals of returning to gardening    Time 6    Period Weeks    Status New    Target Date 04/10/20      PT LONG TERM GOAL #4   Title increase FOTO score to </= 31% limited to demo improvement in function    Time 6    Period Weeks    Status New    Target Date 04/10/20      PT LONG TERM GOAL #5   Title pt to be IND with all HEP to maintain and progress current LOF IND    Time 6    Period Weeks    Status New    Target Date 04/10/20                  Plan - 02/28/20 0945    Clinical Impression Statement pt presents to OPPT with CC of R chronic shoulder pain started 6 months ago with no specific onset. She demonstrates limited flexion / abduction compared bil with assoated weakness with pain during testing. TTP along the long head of the biceps brachii, supraspinatus soreness at the lesser tubercle.She would benefit from physical therapy to decrease R shoulder pain, increase ROM and strength, and  maximize function by addressing the deficits listed.    Personal Factors and Comorbidities Comorbidity 2    Comorbidities hx of depression, osteopenia,    Examination-Activity Limitations Lift    Stability/Clinical Decision Making Evolving/Moderate complexity    Clinical  Decision Making Moderate    Rehab Potential Good    PT Frequency 2x / week    PT Duration 6 weeks    PT Treatment/Interventions ADLs/Self Care Home Management;Electrical Stimulation;Iontophoresis 4mg /ml Dexamethasone;Moist Heat;Ultrasound;Therapeutic exercise;Therapeutic activities;Balance training;Functional mobility training;Neuromuscular re-education;Patient/family education;Manual techniques;Passive range of motion;Dry needling;Taping    PT Next Visit Plan review/ update HEP PRN, reivew FOTO and provide handout. scapular stab, gross shoulder strengthing, STW along long head of biceps brachii, upper trap/ suprasinatus, pilates    PT Home Exercise Plan [id number] - shoulder internal rotation/ external rotation, rows, scaption, and upper trap stretch    Consulted and Agree with Plan of Care Patient           Patient will benefit from skilled therapeutic intervention in order to improve the following deficits and impairments:  Improper body mechanics, Increased muscle spasms, Postural dysfunction, Pain, Decreased activity tolerance, Decreased strength, Decreased endurance  Visit Diagnosis: Chronic right shoulder pain - Plan: PT plan of care cert/re-cert  Muscle weakness (generalized) - Plan: PT plan of care cert/re-cert     Problem List Patient Active Problem List   Diagnosis Date Noted  . Partial tear of right rotator cuff 02/17/2020  . Right shoulder pain 01/14/2020  . Left arm pain 06/29/2019  . Family history of thyroid cancer 12/19/2017  . Gastroesophageal reflux disease 04/22/2016  . Routine general medical examination at a health care facility 03/16/2015  . Osteopenia 04/06/2012  . Depression 11/11/2011  . Trigger finger of both hands   . IBS (irritable bowel syndrome)   . Abnormal chest x-ray    Starr Lake PT, DPT, LAT, ATC  02/28/20  11:40 AM      [facility] [address] [place], [place], [address] Phone:  [phone]   Fax:  [phone]  Name: Renee [name] MRN: [id number] Date of Birth: 10-23-1960

## 2020-03-13 ENCOUNTER — Other Ambulatory Visit: Payer: Self-pay

## 2020-03-13 ENCOUNTER — Encounter: Payer: Self-pay | Admitting: Physical Therapy

## 2020-03-13 ENCOUNTER — Ambulatory Visit: Payer: Federal, State, Local not specified - PPO | Attending: Family Medicine | Admitting: Physical Therapy

## 2020-03-13 DIAGNOSIS — G8929 Other chronic pain: Secondary | ICD-10-CM | POA: Diagnosis not present

## 2020-03-13 DIAGNOSIS — M25511 Pain in right shoulder: Secondary | ICD-10-CM | POA: Insufficient documentation

## 2020-03-13 DIAGNOSIS — M6281 Muscle weakness (generalized): Secondary | ICD-10-CM | POA: Diagnosis not present

## 2020-03-13 NOTE — Patient Instructions (Signed)

## 2020-03-13 NOTE — Therapy (Signed)
[place], [place], [address] Phone: [phone]   Fax:  [phone]  Physical Therapy Treatment  Patient Details  Name: Renee [name] MRN: [id number] Date of Birth: 1961-01-12 Referring Provider (PT): Lyndal Pulley, DO    Encounter Date: 03/13/2020   PT End of Session - 03/13/20 1152    Visit Number 2    Number of Visits 13    Date for PT Re-Evaluation 04/10/20    Authorization Type BCBS    PT Start Time 1152   pt arrived 7 min late   Activity Tolerance Patient tolerated treatment well           Past Medical History:  Diagnosis Date  . Abnormal chest x-ray    CXR c/w COPD. Full PFTs '11 - normal  . Allergic rhinitis, cause unspecified   . Allergy    seasonal  . Anxiety   . Arthritis    both knees, post traumatic - neck  . Cataract    removed bilateral  . Depression    with anxiety.  . Depression   . Dyslipidemia   . Hx of colonic polyps 2008  . IBS (irritable bowel syndrome)   . 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 Migraines    during teenage years  . MVP (mitral valve prolapse)    last 2D echo approx '10  . Osteopenia    DEXA 03/2012: -1.0    Past Surgical History:  Procedure Laterality Date  . APPENDECTOMY    . BUNIONECTOMY WITH HAMMERTOE RECONSTRUCTION Left 07/06/13   [facility]  . CARDIAC CATHETERIZATION  2006   normal study  . CARPAL TUNNEL RELEASE  2011   right hand  . HYSTEROPLASTY REPAIR OF UTERINE ANOMALY     2003 - laproscopically; 2006 - laparotomy  . LAPAROSCOPIC ENDOMETRIOSIS FULGURATION     1996 and 2002  . MANDIBLE RECONSTRUCTION    . NASAL SEPTUM SURGERY  11/2012   [name]  . TRIGGER FINGER RELEASE  2011   R thimb and ring finger  . TRIGGER FINGER RELEASE  02/04/2012   Gramig, in office    There were no vitals filed for this visit.   Subjective Assessment - 03/13/20 1153    Subjective "I've been pretty busy since the last  session. I did to some lifting/ carrying since the last session. I haven't done much of the exercises."    Diagnostic tests 02/17/2020 [place], Limited musculoskeletal ultrasound performed and interpreted by Lyndal Pulley Limited ultrasound shows the patient does have a very small partial tear of the supraspinatus on the anterior aspect.  Patient also has hypoechoic changes of the bicep tendon near the insertion on the anterior labrum that is approach appreciated.  Acromioclavicular appears to be unremarkable.  Does have some osteopenia notedImpression: Partial tear of rotator cuff with capsulitis and bicep tendinitis    Patient Stated Goals strengthen the shoulder, decrease pain, returning to gardening and other personal goals.    Currently in Pain? Yes    Pain Score 4     Pain Orientation Right;Anterior    Pain Descriptors / Indicators Aching;Sore    Pain Type Chronic pain    Pain Onset More than a month ago    Pain Frequency Intermittent    Aggravating Factors  reaching behind the back              [facility] PT Assessment - 03/13/20 0001  Assessment   Medical Diagnosis Acute pain of right shoulder M25.511    Referring Provider (PT) Lyndal Pulley, DO                          [facility] Adult PT Treatment/Exercise - 03/13/20 0001      Exercises   Exercises Shoulder      Shoulder Exercises: Supine   Protraction Strengthening;Right;20 reps   maintained protraction with rhythmic stabilization. x 2 sets     Shoulder Exercises: Seated   Other Seated Exercises scapular retraction 2 x 15 with red theraband    Other Seated Exercises lower trap strengthening with elbows propped on bolster 2 x 12 with red theraband      Shoulder Exercises: Stretch   Other Shoulder Stretches 3- way bicep stretch 1 x 30 sec ea.      Manual Therapy   Manual Therapy Soft tissue mobilization    Manual therapy comments skilled palpation and monitoring of pt throughout TPDN    Soft tissue  mobilization IASTM over bicep brachii            Trigger Point Dry Needling - 03/13/20 0001    Consent Given? Yes    Education Handout Provided Previously provided    Muscles Treated Upper Quadrant Biceps    Biceps Response Twitch response elicited;Palpable increased muscle length   R x 2               PT Education - 03/13/20 1240    Education Details reviewed HEP and discussed importance of consistency. reviewed muscle anatomy, referral patterns. what TPDN is, benefits and what to expect.    Person(s) Educated Patient    Methods Explanation;Verbal cues    Comprehension Verbalized understanding;Verbal cues required            PT Short Term Goals - 03/13/20 1240      PT SHORT TERM GOAL #1   Title pt to be I with initial HEP    Period Weeks    Status On-going             PT Long Term Goals - 02/28/20 1134      PT LONG TERM GOAL #1   Title increase R shulder flexion/ abduction to >/= 130 degrees for functional ROM required for ADLs    Time 6    Period Weeks    Status New    Target Date 04/10/20      PT LONG TERM GOAL #2   Title pt increase R shoulder strength to >/= 4+/5 to assist with shoulder stability with </= 2/10 pain during testing    Time 6    Period Weeks    Status New    Target Date 04/10/20      PT LONG TERM GOAL #3   Title pt to be able to lift/ lower >/= 10# to/from and overhead shelf, and to/from floor to waist with </= 2/10 pain for pt's goals of returning to gardening    Time 6    Period Weeks    Status New    Target Date 04/10/20      PT LONG TERM GOAL #4   Title increase FOTO score to </= 31% limited to demo improvement in function    Time 6    Period Weeks    Status New    Target Date 04/10/20      PT LONG TERM GOAL #5   Title pt to  be IND with all HEP to maintain and progress current LOF IND    Time 6    Period Weeks    Status New    Target Date 04/10/20                 Plan - 03/13/20 1238    Clinical Impression  Statement pt reports limited consistency with her HEP due to having alot of things going on at home. educated and conset was provided for TPDN along the bicep brachii followed with IASTM Techniques. continued working on scapular stability to promote scapulohumeral rhythm which she noted improvement of ease with raising her arm overhead. s    PT Treatment/Interventions ADLs/Self Care Home Management;Electrical Stimulation;Iontophoresis 4mg /ml Dexamethasone;Moist Heat;Ultrasound;Therapeutic exercise;Therapeutic activities;Balance training;Functional mobility training;Neuromuscular re-education;Patient/family education;Manual techniques;Passive range of motion;Dry needling;Taping    PT Next Visit Plan review/ update HEP PRN, . scapular stab, gross shoulder strengthing, STW along long head of biceps brachii, upper trap/ suprasinatus, pilates, response to DN    PT Home Exercise Plan [id number] - shoulder internal rotation/ external rotation, rows, scaption, and upper trap stretch    Consulted and Agree with Plan of Care Patient           Patient will benefit from skilled therapeutic intervention in order to improve the following deficits and impairments:  Improper body mechanics, Increased muscle spasms, Postural dysfunction, Pain, Decreased activity tolerance, Decreased strength, Decreased endurance  Visit Diagnosis: Chronic right shoulder pain  Muscle weakness (generalized)     Problem List Patient Active Problem List   Diagnosis Date Noted  . Partial tear of right rotator cuff 02/17/2020  . Right shoulder pain 01/14/2020  . Left arm pain 06/29/2019  . Family history of thyroid cancer 12/19/2017  . Gastroesophageal reflux disease 04/22/2016  . Routine general medical examination at a health care facility 03/16/2015  . Osteopenia 04/06/2012  . Depression 11/11/2011  . Trigger finger of both hands   . IBS (irritable bowel syndrome)   . Abnormal chest x-ray    Starr Lake PT, DPT,  LAT, ATC  03/13/20  12:42 PM      [facility] [address] [place], [place], [address] Phone: [phone]   Fax:  [phone]  Name: Renee [name] MRN: [id number] Date of Birth: [birth year]

## 2020-03-15 ENCOUNTER — Ambulatory Visit: Payer: Federal, State, Local not specified - PPO | Admitting: Physical Therapy

## 2020-03-15 ENCOUNTER — Other Ambulatory Visit: Payer: Self-pay

## 2020-03-15 ENCOUNTER — Ambulatory Visit: Payer: Federal, State, Local not specified - PPO | Admitting: Family

## 2020-03-15 ENCOUNTER — Encounter: Payer: Self-pay | Admitting: Family

## 2020-03-15 ENCOUNTER — Encounter: Payer: Self-pay | Admitting: Physical Therapy

## 2020-03-15 VITALS — BP 118/70 | HR 69 | Temp 98.1°F | Ht 67.5 in | Wt 141.6 lb

## 2020-03-15 DIAGNOSIS — R0681 Apnea, not elsewhere classified: Secondary | ICD-10-CM

## 2020-03-15 DIAGNOSIS — M25511 Pain in right shoulder: Secondary | ICD-10-CM | POA: Diagnosis not present

## 2020-03-15 DIAGNOSIS — M6281 Muscle weakness (generalized): Secondary | ICD-10-CM

## 2020-03-15 DIAGNOSIS — R42 Dizziness and giddiness: Secondary | ICD-10-CM

## 2020-03-15 DIAGNOSIS — R002 Palpitations: Secondary | ICD-10-CM

## 2020-03-15 DIAGNOSIS — G8929 Other chronic pain: Secondary | ICD-10-CM | POA: Diagnosis not present

## 2020-03-15 NOTE — Progress Notes (Signed)
Renee [name] is a 59 y.o. female with the following history as recorded in [employer]:  Patient Active Problem List   Diagnosis Date Noted  . Partial tear of right rotator cuff 02/17/2020  . Right shoulder pain 01/14/2020  . Left arm pain 06/29/2019  . Family history of thyroid cancer 12/19/2017  . Gastroesophageal reflux disease 04/22/2016  . Routine general medical examination at a health care facility 03/16/2015  . Osteopenia 04/06/2012  . Depression 11/11/2011  . Trigger finger of both hands   . IBS (irritable bowel syndrome)   . Abnormal chest x-ray     Current Outpatient Medications  Medication Sig Dispense Refill  . cyclobenzaprine (FLEXERIL) 5 MG tablet Take 1 tablet (5 mg total) by mouth 3 (three) times daily as needed for muscle spasms. 30 tablet 1  . DIGESTIVE ENZYMES PO Take 1 tablet by mouth every other day.    . fluticasone (FLONASE) 50 MCG/ACT nasal spray Place 2 sprays into both nostrils daily. 48 g 3  . metroNIDAZOLE (METROCREAM) 0.75 % cream     . Multiple Vitamin (MULTIVITAMIN WITH MINERALS) TABS tablet Take 1 tablet by mouth daily.    . pantoprazole (PROTONIX) 40 MG tablet Take 1 tablet (40 mg total) by mouth daily. 90 tablet 0  . sertraline (ZOLOFT) 50 MG tablet TAKE 2 TABLETS BY MOUTH DAILY. 180 tablet 3  . simethicone (MYLICON) 80 MG chewable tablet Chew 80 mg by mouth every 6 (six) hours as needed for flatulence.    . Vitamin D, Ergocalciferol, (DRISDOL) 1.25 MG (50000 UNIT) CAPS capsule Take 1 capsule (50,000 Units total) by mouth every 7 (seven) days. 12 capsule 0   No current facility-administered medications for this visit.    Allergies: Codeine and Pollen extract  Past Medical History:  Diagnosis Date  . Abnormal chest x-ray    CXR c/w COPD. Full PFTs '11 - normal  . Allergic rhinitis, cause unspecified   . Allergy    seasonal  . Anxiety   . Arthritis    both knees, post traumatic - neck  . Cataract    removed bilateral  . Depression    with  anxiety.  . Depression   . Dyslipidemia   . Hx of colonic polyps 2008  . IBS (irritable bowel syndrome)   . 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 Migraines    during teenage years  . MVP (mitral valve prolapse)    last 2D echo approx '10  . Osteopenia    DEXA 03/2012: -1.0    Past Surgical History:  Procedure Laterality Date  . APPENDECTOMY    . BUNIONECTOMY WITH HAMMERTOE RECONSTRUCTION Left 07/06/13   [facility]  . CARDIAC CATHETERIZATION  2006   normal study  . CARPAL TUNNEL RELEASE  2011   right hand  . HYSTEROPLASTY REPAIR OF UTERINE ANOMALY     2003 - laproscopically; 2006 - laparotomy  . LAPAROSCOPIC ENDOMETRIOSIS FULGURATION     1996 and 2002  . MANDIBLE RECONSTRUCTION    . NASAL SEPTUM SURGERY  11/2012   [name]  . TRIGGER FINGER RELEASE  2011   R thimb and ring finger  . TRIGGER FINGER RELEASE  02/04/2012   [facility], in office    Family History  Problem Relation Age of Onset  . Arthritis Mother        DOB 45  . Cancer Mother        Thyroid cancer  . Hypertension Mother   .  Atrial fibrillation Mother   . Melanoma Mother   . Breast cancer Mother   . Arthritis Father        DOB 1929  . Melanoma Father   . Colon polyps Neg Hx   . Rectal cancer Neg Hx   . Stomach cancer Neg Hx     Social History   Tobacco Use  . Smoking status: Never Smoker  . Smokeless tobacco: Never Used  Substance Use Topics  . Alcohol use: Yes    Comment: social    Subjective:  Heart palpitations "on and off" x 1 week; history of heart palpitations but this "very much more pronounced." Did have episodes of dizziness/ feeling winded when episodes were occurring; No prior history of A. Fib; scheduled for thyroid ultrasound later in the week; has recently started new Vitamin D supplement;     Objective:  Vitals:   03/15/20 1210  BP: 118/70  Pulse: 69  Temp: 98.1 F (36.7 C)  TempSrc: Oral  SpO2: 97%  Weight: 141 lb 9.6 oz (64.2 kg)  Height: 5' 7.5"  (1.715 m)    General: Well developed, well nourished, in no acute distress  Skin : Warm and dry.  Head: Normocephalic and atraumatic  Eyes: Sclera and conjunctiva clear; pupils round and reactive to light; extraocular movements intact  Ears: External normal; canals clear; tympanic membranes normal  Oropharynx: Pink, supple. No suspicious lesions  Neck: Supple without thyromegaly, adenopathy  Lungs: Respirations unlabored; clear to auscultation bilaterally without wheeze, rales, rhonchi  CVS exam: normal rate and regular rhythm.  Abdomen: Soft; nontender; nondistended; normoactive bowel sounds; no masses or hepatosplenomegaly  Musculoskeletal: No deformities; no active joint inflammation  Extremities: No edema, cyanosis, clubbing  Vessels: Symmetric bilaterally  Neurologic: Alert and oriented; speech intact; face symmetrical; moves all extremities well; CNII-XII intact without focal deficit  Assessment:  1. Palpitations   2. Dizziness   3. Witnessed episode of apnea     Plan:  EKG shows NSR/ no abnormality noted; concern for A. Fib based on description of symptoms; hold Vitamin D supplement; refer to cardiology; start 81 mg daily ASA in the interim until she sees cardiology; refer for sleep study as well;  This visit occurred during the SARS-CoV-2 public health emergency.  Safety protocols were in place, including screening questions prior to the visit, additional usage of staff PPE, and extensive cleaning of exam room while observing appropriate contact time as indicated for disinfecting solutions.     No follow-ups on file.  Orders Placed This Encounter  Procedures  . CBC with Differential/Platelet    Standing Status:   Future    Number of Occurrences:   1    Standing Expiration Date:   03/15/2021  . Comp Met (CMET)    Standing Status:   Future    Number of Occurrences:   1    Standing Expiration Date:   03/15/2021  . Ambulatory referral to Cardiology    Referral Priority:    Urgent    Referral Type:   Consultation    Referral Reason:   Specialty Services Required    Requested Specialty:   Cardiology    Number of Visits Requested:   1  . Ambulatory referral to Neurology    Referral Priority:   Routine    Referral Type:   Consultation    Referral Reason:   Specialty Services Required    Requested Specialty:   Neurology    Number of Visits Requested:   1  .  EKG 12-Lead    Requested Prescriptions    No prescriptions requested or ordered in this encounter

## 2020-03-15 NOTE — Therapy (Signed)
[place], [place], [address] Phone: [phone]   Fax:  [phone]  Physical Therapy Treatment  Patient Details  Name: Vanity Larsson MRN: [id number] Date of Birth: May 10, 1961 Referring Provider (PT): Lyndal Pulley, DO    Encounter Date: 03/15/2020   PT End of Session - 03/15/20 1056    Visit Number 3    Number of Visits 13    Date for PT Re-Evaluation 04/10/20    Authorization Type BCBS    PT Start Time 1050    PT Stop Time 1140    PT Time Calculation (min) 50 min    Activity Tolerance Patient tolerated treatment well    Behavior During Therapy [facility] for tasks assessed/performed           Past Medical History:  Diagnosis Date  . Abnormal chest x-ray    CXR c/w COPD. Full PFTs '11 - normal  . Allergic rhinitis, cause unspecified   . Allergy    seasonal  . Anxiety   . Arthritis    both knees, post traumatic - neck  . Cataract    removed bilateral  . Depression    with anxiety.  . Depression   . Dyslipidemia   . Hx of colonic polyps 2008  . IBS (irritable bowel syndrome)   . 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 Migraines    during teenage years  . MVP (mitral valve prolapse)    last 2D echo approx '10  . Osteopenia    DEXA 03/2012: -1.0    Past Surgical History:  Procedure Laterality Date  . APPENDECTOMY    . BUNIONECTOMY WITH HAMMERTOE RECONSTRUCTION Left 07/06/13   [facility]  . CARDIAC CATHETERIZATION  2006   normal study  . CARPAL TUNNEL RELEASE  2011   right hand  . HYSTEROPLASTY REPAIR OF UTERINE ANOMALY     2003 - laproscopically; 2006 - laparotomy  . LAPAROSCOPIC ENDOMETRIOSIS FULGURATION     1996 and 2002  . MANDIBLE RECONSTRUCTION    . NASAL SEPTUM SURGERY  11/2012   [name]  . TRIGGER FINGER RELEASE  2011   R thimb and ring finger  . TRIGGER FINGER RELEASE  02/04/2012   Gramig, in office    There were no vitals filed for this  visit.   Subjective Assessment - 03/15/20 1053    Subjective No pain at rest, is hoping to be more compliant now.  Dry needling helped, was sore.    Diagnostic tests 02/17/2020 [place], Limited musculoskeletal ultrasound performed and interpreted by Lyndal Pulley Limited ultrasound shows the patient does have a very small partial tear of the supraspinatus on the anterior aspect.  Patient also has hypoechoic changes of the bicep tendon near the insertion on the anterior labrum that is approach appreciated.  Acromioclavicular appears to be unremarkable.  Does have some osteopenia notedImpression: Partial tear of rotator cuff with capsulitis and bicep tendinitis    Currently in Pain? No/denies             [facility] Adult PT Treatment/Exercise - 03/15/20 0001      Shoulder Exercises: Supine   Other Supine Exercises Pilates circle chest press and overhead lift with hands outside and then hands inside for Isometric external rotation       Shoulder Exercises: Sidelying   External Rotation Strengthening;Right;15 reps    External Rotation Weight (lbs) 2   2 sets    Flexion Strengthening;Right;15  reps    Flexion Weight (lbs) 0    ABduction Strengthening;Right;15 reps    ABduction Weight (lbs) 0      Shoulder Exercises: Standing   External Rotation Strengthening;20 reps    Theraband Level (Shoulder External Rotation) Level 2 (Red)    Internal Rotation Strengthening;Right;20 reps;Theraband    Theraband Level (Shoulder Internal Rotation) Level 2 (Red)    Row Strengthening;Both;15 reps    Theraband Level (Shoulder Row) Level 2 (Red)      Shoulder Exercises: ROM/Strengthening   UBE (Upper Arm Bike) 5 min L1 retro    Ranger flexion and scaption with weight shift x 2 min each       Modalities   Modalities Cryotherapy      Cryotherapy   Number Minutes Cryotherapy 6 Minutes    Cryotherapy Location Shoulder    Type of Cryotherapy Ice pack      Manual Therapy   Manual Therapy Passive ROM    Passive  ROM all planes for assessment                     PT Short Term Goals - 03/15/20 1108      PT SHORT TERM GOAL #1   Title pt to be I with initial HEP    Status On-going             PT Long Term Goals - 02/28/20 1134      PT LONG TERM GOAL #1   Title increase R shulder flexion/ abduction to >/= 130 degrees for functional ROM required for ADLs    Time 6    Period Weeks    Status New    Target Date 04/10/20      PT LONG TERM GOAL #2   Title pt increase R shoulder strength to >/= 4+/5 to assist with shoulder stability with </= 2/10 pain during testing    Time 6    Period Weeks    Status New    Target Date 04/10/20      PT LONG TERM GOAL #3   Title pt to be able to lift/ lower >/= 10# to/from and overhead shelf, and to/from floor to waist with </= 2/10 pain for pt's goals of returning to gardening    Time 6    Period Weeks    Status New    Target Date 04/10/20      PT LONG TERM GOAL #4   Title increase FOTO score to </= 31% limited to demo improvement in function    Time 6    Period Weeks    Status New    Target Date 04/10/20      PT LONG TERM GOAL #5   Title pt to be IND with all HEP to maintain and progress current LOF IND    Time 6    Period Weeks    Status New    Target Date 04/10/20                 Plan - 03/15/20 1237    Clinical Impression Statement Pt with min to no pain today, able to work without increaed pain.  External rotation 4-/5 with decreaed endurance. Scapular mobility is excessive with manual assessment in sidelying.  Needs cues for stability throughout.    PT Treatment/Interventions ADLs/Self Care Home Management;Electrical Stimulation;Iontophoresis 4mg /ml Dexamethasone;Moist Heat;Ultrasound;Therapeutic exercise;Therapeutic activities;Balance training;Functional mobility training;Neuromuscular re-education;Patient/family education;Manual techniques;Passive range of motion;Dry needling;Taping    PT Next Visit Plan review/ update  HEP  PRN, . scapular stab, gross shoulder strengthing, STW along long head of biceps brachii, upper trap/ suprasinatus, pilates, response to DN    PT Home Exercise Plan [id number] - shoulder internal rotation/ external rotation, rows, scaption, and upper trap stretch    Consulted and Agree with Plan of Care Patient           Patient will benefit from skilled therapeutic intervention in order to improve the following deficits and impairments:  Improper body mechanics, Increased muscle spasms, Postural dysfunction, Pain, Decreased activity tolerance, Decreased strength, Decreased endurance  Visit Diagnosis: Chronic right shoulder pain  Muscle weakness (generalized)     Problem List Patient Active Problem List   Diagnosis Date Noted  . Partial tear of right rotator cuff 02/17/2020  . Right shoulder pain 01/14/2020  . Left arm pain 06/29/2019  . Family history of thyroid cancer 12/19/2017  . Gastroesophageal reflux disease 04/22/2016  . Routine general medical examination at a health care facility 03/16/2015  . Osteopenia 04/06/2012  . Depression 11/11/2011  . Trigger finger of both hands   . IBS (irritable bowel syndrome)   . Abnormal chest x-ray     Kaysan Peixoto 03/15/2020, 12:43 PM  [place], [place], [address] Phone: [phone]   Fax:  [phone]  Name: Jullianna Gabor MRN: [id number] Date of Birth: [birth year]  Raeford Razor, PT 03/15/20 12:43 PM Phone: [phone] Fax: [phone]

## 2020-03-16 ENCOUNTER — Ambulatory Visit
Admission: RE | Admit: 2020-03-16 | Discharge: 2020-03-16 | Disposition: A | Discharge status: Home / Self Care | Payer: Federal, State, Local not specified - PPO | Source: Ambulatory Visit | Attending: Endocrinology | Admitting: Endocrinology

## 2020-03-16 DIAGNOSIS — Z808 Family history of malignant neoplasm of other organs or systems: Secondary | ICD-10-CM | POA: Diagnosis not present

## 2020-03-16 DIAGNOSIS — E041 Nontoxic single thyroid nodule: Secondary | ICD-10-CM | POA: Diagnosis not present

## 2020-03-16 LAB — COMPREHENSIVE METABOLIC PANEL
AG Ratio: 1.7 (calc) (ref 1.0–2.5)
ALT: 14 U/L (ref 6–29)
AST: 16 U/L (ref 10–35)
Albumin: 4.4 g/dL (ref 3.6–5.1)
Alkaline phosphatase (APISO): 66 U/L (ref 37–153)
BUN: 19 mg/dL (ref 7–25)
CO2: 30 mmol/L (ref 20–32)
Calcium: 10 mg/dL (ref 8.6–10.4)
Chloride: 101 mmol/L (ref 98–110)
Creat: 0.91 mg/dL (ref 0.50–1.05)
Globulin: 2.6 g/dL (calc) (ref 1.9–3.7)
Glucose, Bld: 90 mg/dL (ref 65–99)
Potassium: 4.6 mmol/L (ref 3.5–5.3)
Sodium: 140 mmol/L (ref 135–146)
Total Bilirubin: 0.8 mg/dL (ref 0.2–1.2)
Total Protein: 7 g/dL (ref 6.1–8.1)

## 2020-03-16 LAB — CBC WITH DIFFERENTIAL/PLATELET
Absolute Monocytes: 437 cells/uL (ref 200–950)
Basophils Absolute: 28 cells/uL (ref 0–200)
Basophils Relative: 0.5 %
Eosinophils Absolute: 129 cells/uL (ref 15–500)
Eosinophils Relative: 2.3 %
HCT: 40.8 % (ref 35.0–45.0)
Hemoglobin: 13.6 g/dL (ref 11.7–15.5)
Lymphs Abs: 1456 cells/uL (ref 850–3900)
MCH: 30.5 pg (ref 27.0–33.0)
MCHC: 33.3 g/dL (ref 32.0–36.0)
MCV: 91.5 fL (ref 80.0–100.0)
MPV: 10.5 fL (ref 7.5–12.5)
Monocytes Relative: 7.8 %
Neutro Abs: 3550 cells/uL (ref 1500–7800)
Neutrophils Relative %: 63.4 %
Platelets: 222 10*3/uL (ref 140–400)
RBC: 4.46 10*6/uL (ref 3.80–5.10)
RDW: 12.5 % (ref 11.0–15.0)
Total Lymphocyte: 26 %
WBC: 5.6 10*3/uL (ref 3.8–10.8)

## 2020-03-20 ENCOUNTER — Other Ambulatory Visit: Payer: Self-pay

## 2020-03-20 ENCOUNTER — Ambulatory Visit: Payer: Federal, State, Local not specified - PPO | Admitting: Physical Therapy

## 2020-03-20 ENCOUNTER — Encounter: Payer: Self-pay | Admitting: Physical Therapy

## 2020-03-20 DIAGNOSIS — M6281 Muscle weakness (generalized): Secondary | ICD-10-CM | POA: Diagnosis not present

## 2020-03-20 DIAGNOSIS — G8929 Other chronic pain: Secondary | ICD-10-CM

## 2020-03-20 DIAGNOSIS — M25511 Pain in right shoulder: Secondary | ICD-10-CM

## 2020-03-20 NOTE — Therapy (Signed)
[place], [place], [address] Phone: [phone]   Fax:  [phone]  Physical Therapy Treatment  Patient Details  Name: Renee [name] MRN: [id number] Date of Birth: [birth year] Referring Provider (PT): Lyndal Pulley, DO    Encounter Date: 03/20/2020   PT End of Session - 03/20/20 1047    Visit Number 4    Number of Visits 13    Date for PT Re-Evaluation 04/10/20    Authorization Type BCBS    PT Start Time 1046    PT Stop Time 1144    PT Time Calculation (min) 58 min    Activity Tolerance Patient tolerated treatment well    Behavior During Therapy [facility] for tasks assessed/performed           Past Medical History:  Diagnosis Date  . Abnormal chest x-ray    CXR c/w COPD. Full PFTs '11 - normal  . Allergic rhinitis, cause unspecified   . Allergy    seasonal  . Anxiety   . Arthritis    both knees, post traumatic - neck  . Cataract    removed bilateral  . Depression    with anxiety.  . Depression   . Dyslipidemia   . Hx of colonic polyps 2008  . IBS (irritable bowel syndrome)   . 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 Migraines    during teenage years  . MVP (mitral valve prolapse)    last 2D echo approx '10  . Osteopenia    DEXA 03/2012: -1.0    Past Surgical History:  Procedure Laterality Date  . APPENDECTOMY    . BUNIONECTOMY WITH HAMMERTOE RECONSTRUCTION Left 07/06/13   [facility]  . CARDIAC CATHETERIZATION  2006   normal study  . CARPAL TUNNEL RELEASE  2011   right hand  . HYSTEROPLASTY REPAIR OF UTERINE ANOMALY     2003 - laproscopically; 2006 - laparotomy  . LAPAROSCOPIC ENDOMETRIOSIS FULGURATION     1996 and 2002  . MANDIBLE RECONSTRUCTION    . NASAL SEPTUM SURGERY  11/2012   [name]  . TRIGGER FINGER RELEASE  2011   R thimb and ring finger  . TRIGGER FINGER RELEASE  02/04/2012   Gramig, in office    There were no vitals filed for this  visit.   Subjective Assessment - 03/20/20 1052    Subjective Pain is about 5/10 if I move it or try to get out of bed.    Currently in Pain? Yes    Pain Score 5     Pain Location Shoulder    Pain Orientation Right;Anterior    Pain Descriptors / Indicators Aching    Pain Onset More than a month ago    Pain Frequency Intermittent    Aggravating Factors  reaching back    Pain Relieving Factors ibuprofen              Pilates Reformer used for LE/core strength, postural strength, lumbopelvic disassociation and core control.  Exercises included:  Supine Arm work  1 [employer], Circles, Triceps   Quadruped 1 Red UE x 10 then add LE x 10 to a kneeling plank press out x 10   Long box Prone 1 Red overhead press double  Single arm x 1 blue then back to red for mini- swan   Seated Arms  1 blue: serving x 10    Hug a tree x 8  Side facing external  rotation and internal rotation x 10 each   ER (yellow) and IR (blue)  Min cues for posture and alignment     [facility] Adult PT Treatment/Exercise - 03/20/20 0001      Cryotherapy   Number Minutes Cryotherapy 10 Minutes    Cryotherapy Location Shoulder    Type of Cryotherapy Ice pack      Manual Therapy   Soft tissue mobilization Rt biceps, Rt upper trap, deltoid and supraspinatus     Passive ROM combined abduction and external rotation                     PT Short Term Goals - 03/15/20 1108      PT SHORT TERM GOAL #1   Title pt to be I with initial HEP    Status On-going             PT Long Term Goals - 02/28/20 1134      PT LONG TERM GOAL #1   Title increase R shulder flexion/ abduction to >/= 130 degrees for functional ROM required for ADLs    Time 6    Period Weeks    Status New    Target Date 04/10/20      PT LONG TERM GOAL #2   Title pt increase R shoulder strength to >/= 4+/5 to assist with shoulder stability with </= 2/10 pain during testing    Time 6    Period Weeks    Status New    Target Date  04/10/20      PT LONG TERM GOAL #3   Title pt to be able to lift/ lower >/= 10# to/from and overhead shelf, and to/from floor to waist with </= 2/10 pain for pt's goals of returning to gardening    Time 6    Period Weeks    Status New    Target Date 04/10/20      PT LONG TERM GOAL #4   Title increase FOTO score to </= 31% limited to demo improvement in function    Time 6    Period Weeks    Status New    Target Date 04/10/20      PT LONG TERM GOAL #5   Title pt to be IND with all HEP to maintain and progress current LOF IND    Time 6    Period Weeks    Status New    Target Date 04/10/20                 Plan - 03/20/20 1120    Clinical Impression Statement Pt able to exercise using the Pilates Reformer for strengthening and stabilization of UE, Core without increasing pain .  Modified spring tension for external rotation of Rt UE    PT Treatment/Interventions ADLs/Self Care Home Management;Electrical Stimulation;Iontophoresis 4mg /ml Dexamethasone;Moist Heat;Ultrasound;Therapeutic exercise;Therapeutic activities;Balance training;Functional mobility training;Neuromuscular re-education;Patient/family education;Manual techniques;Passive range of motion;Dry needling;Taping    PT Next Visit Plan review/ update HEP PRN, . scapular stab, gross shoulder strengthing, manual as needed ,  pilates, response to DN    PT Home Exercise Plan [id number] - shoulder internal rotation/ external rotation, rows, scaption, and upper trap stretch    Consulted and Agree with Plan of Care Patient           Patient will benefit from skilled therapeutic intervention in order to improve the following deficits and impairments:  Improper body mechanics, Increased muscle spasms, Postural dysfunction, Pain, Decreased activity tolerance, Decreased strength, Decreased  endurance  Visit Diagnosis: Chronic right shoulder pain  Muscle weakness (generalized)     Problem List Patient Active Problem List    Diagnosis Date Noted  . Partial tear of right rotator cuff 02/17/2020  . Right shoulder pain 01/14/2020  . Left arm pain 06/29/2019  . Family history of thyroid cancer 12/19/2017  . Gastroesophageal reflux disease 04/22/2016  . Routine general medical examination at a health care facility 03/16/2015  . Osteopenia 04/06/2012  . Depression 11/11/2011  . Trigger finger of both hands   . IBS (irritable bowel syndrome)   . Abnormal chest x-ray     Renee [name] 03/20/2020, 11:48 AM  [place], [place], [address] Phone: [phone]   Fax:  [phone]  Name: Renee [name] MRN: [id number] Date of Birth: Nov 06, 1960  Raeford Razor, PT 03/20/20 11:48 AM Phone: [phone] Fax: [phone]

## 2020-03-22 ENCOUNTER — Other Ambulatory Visit: Payer: Self-pay

## 2020-03-22 ENCOUNTER — Encounter: Payer: Self-pay | Admitting: Physical Therapy

## 2020-03-22 ENCOUNTER — Ambulatory Visit: Payer: Federal, State, Local not specified - PPO | Admitting: Physical Therapy

## 2020-03-22 DIAGNOSIS — G8929 Other chronic pain: Secondary | ICD-10-CM

## 2020-03-22 DIAGNOSIS — M25511 Pain in right shoulder: Secondary | ICD-10-CM | POA: Diagnosis not present

## 2020-03-22 DIAGNOSIS — M6281 Muscle weakness (generalized): Secondary | ICD-10-CM | POA: Diagnosis not present

## 2020-03-22 NOTE — Therapy (Signed)
[place], [place], [address] Phone: [phone]   Fax:  [phone]  Physical Therapy Treatment  Patient Details  Name: Renee [name] MRN: [id number] Date of Birth: 1961/03/18 Referring Provider (PT): Lyndal Pulley, DO    Encounter Date: 03/22/2020   PT End of Session - 03/22/20 1505    Visit Number 5    Number of Visits 13    Date for PT Re-Evaluation 04/10/20    Authorization Type BCBS    PT Start Time 1502    PT Stop Time 1547    PT Time Calculation (min) 45 min    Activity Tolerance Patient tolerated treatment well    Behavior During Therapy [facility] for tasks assessed/performed           Past Medical History:  Diagnosis Date  . Abnormal chest x-ray    CXR c/w COPD. Full PFTs '11 - normal  . Allergic rhinitis, cause unspecified   . Allergy    seasonal  . Anxiety   . Arthritis    both knees, post traumatic - neck  . Cataract    removed bilateral  . Depression    with anxiety.  . Depression   . Dyslipidemia   . Hx of colonic polyps 2008  . IBS (irritable bowel syndrome)   . 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 Migraines    during teenage years  . MVP (mitral valve prolapse)    last 2D echo approx '10  . Osteopenia    DEXA 03/2012: -1.0    Past Surgical History:  Procedure Laterality Date  . APPENDECTOMY    . BUNIONECTOMY WITH HAMMERTOE RECONSTRUCTION Left 07/06/13   [facility]  . CARDIAC CATHETERIZATION  2006   normal study  . CARPAL TUNNEL RELEASE  2011   right hand  . HYSTEROPLASTY REPAIR OF UTERINE ANOMALY     2003 - laproscopically; 2006 - laparotomy  . LAPAROSCOPIC ENDOMETRIOSIS FULGURATION     1996 and 2002  . MANDIBLE RECONSTRUCTION    . NASAL SEPTUM SURGERY  11/2012   [name]  . TRIGGER FINGER RELEASE  2011   R thimb and ring finger  . TRIGGER FINGER RELEASE  02/04/2012   [facility]ig, in office    There were no vitals filed for this  visit.   Subjective Assessment - 03/22/20 1506    Subjective "I was pretty sore after the last session but I liked the reformer. I was unsure if the DN helped last time."    Diagnostic tests 02/17/2020 [place], Limited musculoskeletal ultrasound performed and interpreted by Lyndal Pulley Limited ultrasound shows the patient does have a very small partial tear of the supraspinatus on the anterior aspect.  Patient also has hypoechoic changes of the bicep tendon near the insertion on the anterior labrum that is approach appreciated.  Acromioclavicular appears to be unremarkable.  Does have some osteopenia notedImpression: Partial tear of rotator cuff with capsulitis and bicep tendinitis    Patient Stated Goals strengthen the shoulder, decrease pain, returning to gardening and other personal goals.    Currently in Pain? Yes    Pain Score 4     Pain Location Shoulder    Pain Orientation Right;Anterior    Pain Type Surgical pain    Pain Onset More than a month ago              [facility] PT Assessment - 03/22/20 0001  Assessment   Medical Diagnosis Acute pain of right shoulder M25.511    Referring Provider (PT) Lyndal Pulley, DO                          [facility] Adult PT Treatment/Exercise - 03/22/20 0001      Elbow Exercises   Elbow Flexion Right;15 reps;Strengthening   3#, focus on eccentric loading     Shoulder Exercises: Prone   Other Prone Exercises R UE I's, T's and Y's. 1 x 15 ea.      Shoulder Exercises: Sidelying   External Rotation Strengthening;Right;10 reps;Weights    External Rotation Weight (lbs) 3    Internal Rotation Strengthening;Right   MAI with manual resistance     Shoulder Exercises: Standing   Protraction Strengthening;Both;10 reps   pushing into pink foam roller with shoulder flexion     Manual Therapy   Manual therapy comments skilled palpation and monitoring of pt throughout TPDN    Soft tissue mobilization Rt biceps, Rt upper trap, deltoid and  supraspinatus             Trigger Point Dry Needling - 03/22/20 0001    Consent Given? Yes    Education Handout Provided Previously provided    Muscles Treated Head and Neck Levator scapulae;Upper trapezius    Upper Trapezius Response Twitch reponse elicited;Palpable increased muscle length   R only   Levator Scapulae Response Twitch response elicited;Palpable increased muscle length   R only   Biceps Response Twitch response elicited;Palpable increased muscle length   R only               PT Education - 03/22/20 1542    Education Details okay to resume gardening but to take it slowly and listen to her body if she starts to have pain then thats her body saying to back off.    Person(s) Educated Patient    Methods Explanation;Verbal cues    Comprehension Verbalized understanding;Verbal cues required            PT Short Term Goals - 03/15/20 1108      PT SHORT TERM GOAL #1   Title pt to be I with initial HEP    Status On-going             PT Long Term Goals - 02/28/20 1134      PT LONG TERM GOAL #1   Title increase R shulder flexion/ abduction to >/= 130 degrees for functional ROM required for ADLs    Time 6    Period Weeks    Status New    Target Date 04/10/20      PT LONG TERM GOAL #2   Title pt increase R shoulder strength to >/= 4+/5 to assist with shoulder stability with </= 2/10 pain during testing    Time 6    Period Weeks    Status New    Target Date 04/10/20      PT LONG TERM GOAL #3   Title pt to be able to lift/ lower >/= 10# to/from and overhead shelf, and to/from floor to waist with </= 2/10 pain for pt's goals of returning to gardening    Time 6    Period Weeks    Status New    Target Date 04/10/20      PT LONG TERM GOAL #4   Title increase FOTO score to </= 31% limited to demo improvement in function  Time 6    Period Weeks    Status New    Target Date 04/10/20      PT LONG TERM GOAL #5   Title pt to be IND with all HEP to  maintain and progress current LOF IND    Time 6    Period Weeks    Status New    Target Date 04/10/20                 Plan - 03/22/20 1540    Clinical Impression Statement continued TPDN focusing on the R upper trap/ levator scapulae and biceps brachii. continued working on scapular stabilizing exercises which she did well but does fatigue quickly. end of session she reported soreness from the DN but declined modalities.    PT Treatment/Interventions ADLs/Self Care Home Management;Electrical Stimulation;Iontophoresis 4mg /ml Dexamethasone;Moist Heat;Ultrasound;Therapeutic exercise;Therapeutic activities;Balance training;Functional mobility training;Neuromuscular re-education;Patient/family education;Manual techniques;Passive range of motion;Dry needling;Taping    PT Next Visit Plan review/ update HEP PRN, . scapular stab, gross shoulder strengthing, manual as needed ,  pilates, response to DN    PT Home Exercise Plan [id number] - shoulder internal rotation/ external rotation, rows, scaption, and upper trap stretch    Consulted and Agree with Plan of Care Patient           Patient will benefit from skilled therapeutic intervention in order to improve the following deficits and impairments:  Improper body mechanics, Increased muscle spasms, Postural dysfunction, Pain, Decreased activity tolerance, Decreased strength, Decreased endurance  Visit Diagnosis: Chronic right shoulder pain  Muscle weakness (generalized)     Problem List Patient Active Problem List   Diagnosis Date Noted  . Partial tear of right rotator cuff 02/17/2020  . Right shoulder pain 01/14/2020  . Left arm pain 06/29/2019  . Family history of thyroid cancer 12/19/2017  . Gastroesophageal reflux disease 04/22/2016  . Routine general medical examination at a health care facility 03/16/2015  . Osteopenia 04/06/2012  . Depression 11/11/2011  . Trigger finger of both hands   . IBS (irritable bowel syndrome)   .  Abnormal chest x-ray    Starr Lake PT, DPT, LAT, ATC  03/22/20  3:53 PM      [facility] [address] [place], [place], [address] Phone: [phone]   Fax:  [phone]  Name: Renee [name] MRN: [id number] Date of Birth: December 18, 1960

## 2020-03-27 ENCOUNTER — Ambulatory Visit: Payer: Federal, State, Local not specified - PPO | Admitting: Physical Therapy

## 2020-03-27 ENCOUNTER — Other Ambulatory Visit: Payer: Self-pay

## 2020-03-27 ENCOUNTER — Encounter: Payer: Self-pay | Admitting: Physical Therapy

## 2020-03-27 DIAGNOSIS — M6281 Muscle weakness (generalized): Secondary | ICD-10-CM

## 2020-03-27 DIAGNOSIS — M25511 Pain in right shoulder: Secondary | ICD-10-CM | POA: Diagnosis not present

## 2020-03-27 DIAGNOSIS — G8929 Other chronic pain: Secondary | ICD-10-CM

## 2020-03-27 NOTE — Therapy (Signed)
[place], [place], [address] Phone: [phone]   Fax:  [phone]  Physical Therapy Treatment  Patient Details  Name: Renee [name] MRN: [id number] Date of Birth: 04-17-1961 Referring Provider (PT): Lyndal Pulley, DO    Encounter Date: 03/27/2020   PT End of Session - 03/27/20 1109    Visit Number 6    Number of Visits 13    Date for PT Re-Evaluation 04/10/20    Authorization Type BCBS    PT Start Time 1106    PT Stop Time 1144    PT Time Calculation (min) 38 min    Activity Tolerance Patient tolerated treatment well    Behavior During Therapy [facility] for tasks assessed/performed           Past Medical History:  Diagnosis Date  . Abnormal chest x-ray    CXR c/w COPD. Full PFTs '11 - normal  . Allergic rhinitis, cause unspecified   . Allergy    seasonal  . Anxiety   . Arthritis    both knees, post traumatic - neck  . Cataract    removed bilateral  . Depression    with anxiety.  . Depression   . Dyslipidemia   . Hx of colonic polyps 2008  . IBS (irritable bowel syndrome)   . 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 Migraines    during teenage years  . MVP (mitral valve prolapse)    last 2D echo approx '10  . Osteopenia    DEXA 03/2012: -1.0    Past Surgical History:  Procedure Laterality Date  . APPENDECTOMY    . BUNIONECTOMY WITH HAMMERTOE RECONSTRUCTION Left 07/06/13   [facility]  . CARDIAC CATHETERIZATION  2006   normal study  . CARPAL TUNNEL RELEASE  2011   right hand  . HYSTEROPLASTY REPAIR OF UTERINE ANOMALY     2003 - laproscopically; 2006 - laparotomy  . LAPAROSCOPIC ENDOMETRIOSIS FULGURATION     1996 and 2002  . MANDIBLE RECONSTRUCTION    . NASAL SEPTUM SURGERY  11/2012   [name]  . TRIGGER FINGER RELEASE  2011   R thimb and ring finger  . TRIGGER FINGER RELEASE  02/04/2012   G[facility]ig, in office    There were no vitals filed for this  visit.       [facility] PT Assessment - 03/27/20 0001      Assessment   Medical Diagnosis Acute pain of right shoulder M25.511    Referring Provider (PT) Lyndal Pulley, DO       Observation/Other Assessments   Focus on Therapeutic Outcomes (FOTO)  37% limited                         [place] Adult PT Treatment/Exercise - 03/27/20 0001      Self-Care   Self-Care Other Self-Care Comments    Other Self-Care Comments  how to perform self trigger point release and tools that can assist with self treatment.       Shoulder Exercises: Standing   Protraction Strengthening;Both   pressing bil UE into pink foam roll combined with flexion   External Rotation Strengthening;12 reps   x 2 sets   Theraband Level (Shoulder External Rotation) Level 3 (Green)    Internal Rotation Strengthening;Right;15 reps;Theraband    Theraband Level (Shoulder Internal Rotation) Level 3 (Green)    Flexion Strengthening;Both;10 reps;Weights    Shoulder Flexion  Weight (lbs) 1    Flexion Limitations scaption angle    Row Strengthening;Both;15 reps    Theraband Level (Shoulder Row) Level 3 (Green)      Shoulder Exercises: ROM/Strengthening   UBE (Upper Arm Bike) L1 x 6 min    fwd/bwd x 3 min ea.     Shoulder Exercises: Stretch   Other Shoulder Stretches 3- way bicep stretch 1 x 30 sec ea.    Other Shoulder Stretches upper trap/ levator scapulae stretch R only 2 x 30 secon                  PT Education - 03/27/20 1110    Education Details Reviewed FOTO assessment. How to perform manual trigger point release.    Person(s) Educated Patient    Methods Explanation    Comprehension Verbalized understanding            PT Short Term Goals - 03/15/20 1108      PT SHORT TERM GOAL #1   Title pt to be I with initial HEP    Status On-going             PT Long Term Goals - 02/28/20 1134      PT LONG TERM GOAL #1   Title increase R shulder flexion/ abduction to >/= 130 degrees for  functional ROM required for ADLs    Time 6    Period Weeks    Status New    Target Date 04/10/20      PT LONG TERM GOAL #2   Title pt increase R shoulder strength to >/= 4+/5 to assist with shoulder stability with </= 2/10 pain during testing    Time 6    Period Weeks    Status New    Target Date 04/10/20      PT LONG TERM GOAL #3   Title pt to be able to lift/ lower >/= 10# to/from and overhead shelf, and to/from floor to waist with </= 2/10 pain for pt's goals of returning to gardening    Time 6    Period Weeks    Status New    Target Date 04/10/20      PT LONG TERM GOAL #4   Title increase FOTO score to </= 31% limited to demo improvement in function    Time 6    Period Weeks    Status New    Target Date 04/10/20      PT LONG TERM GOAL #5   Title pt to be IND with all HEP to maintain and progress current LOF IND    Time 6    Period Weeks    Status New    Target Date 04/10/20                 Plan - 03/27/20 1257    Clinical Impression Statement pt reports improvement in shoulder ROM and additionally reports decreased pain. Reviewed how to perform self trigger point release and tools that can assist with self treatment. focused session on rotator cuff strength and scapular stability which she did well and was able to progress resistance to green theraband.    PT Treatment/Interventions ADLs/Self Care Home Management;Electrical Stimulation;Iontophoresis 4mg /ml Dexamethasone;Moist Heat;Ultrasound;Therapeutic exercise;Therapeutic activities;Balance training;Functional mobility training;Neuromuscular re-education;Patient/family education;Manual techniques;Passive range of motion;Dry needling;Taping    PT Next Visit Plan review/ update HEP PRN, . scapular stab, gross shoulder strengthing, manual as needed ,  pilates, dynamic scap stability, farmers carry,    PT Home Exercise  Plan [id number] - shoulder internal rotation/ external rotation, rows, scaption, and upper trap  stretch    Consulted and Agree with Plan of Care Patient           Patient will benefit from skilled therapeutic intervention in order to improve the following deficits and impairments:  Improper body mechanics, Increased muscle spasms, Postural dysfunction, Pain, Decreased activity tolerance, Decreased strength, Decreased endurance  Visit Diagnosis: Chronic right shoulder pain  Muscle weakness (generalized)     Problem List Patient Active Problem List   Diagnosis Date Noted  . Partial tear of right rotator cuff 02/17/2020  . Right shoulder pain 01/14/2020  . Left arm pain 06/29/2019  . Family history of thyroid cancer 12/19/2017  . Gastroesophageal reflux disease 04/22/2016  . Routine general medical examination at a health care facility 03/16/2015  . Osteopenia 04/06/2012  . Depression 11/11/2011  . Trigger finger of both hands   . IBS (irritable bowel syndrome)   . Abnormal chest x-ray    Starr Lake PT, DPT, LAT, ATC  03/27/20  1:01 PM      [facility] [address] [place], [place], [address] Phone: [phone]   Fax:  [phone]  Name: Renee [name] MRN: [id number] Date of Birth: [birth year]

## 2020-03-29 ENCOUNTER — Ambulatory Visit: Payer: Federal, State, Local not specified - PPO | Admitting: Physical Therapy

## 2020-03-29 ENCOUNTER — Other Ambulatory Visit: Payer: Self-pay

## 2020-03-29 ENCOUNTER — Encounter: Payer: Self-pay | Admitting: Physical Therapy

## 2020-03-29 DIAGNOSIS — M25511 Pain in right shoulder: Secondary | ICD-10-CM | POA: Diagnosis not present

## 2020-03-29 DIAGNOSIS — G8929 Other chronic pain: Secondary | ICD-10-CM | POA: Diagnosis not present

## 2020-03-29 DIAGNOSIS — M6281 Muscle weakness (generalized): Secondary | ICD-10-CM

## 2020-03-29 NOTE — Therapy (Signed)
[place] [address] [place], [place], [address] Phone: [phone]   Fax:  [phone]  Physical Therapy Treatment  Patient Details  Name: Renee [name] MRN: [id number] Date of Birth: 1960/08/13 Referring Provider (PT): Lyndal Pulley, DO    Encounter Date: 03/29/2020   PT End of Session - 03/29/20 1103    Visit Number 7    Number of Visits 13    Date for PT Re-Evaluation 04/10/20    Authorization Type BCBS    PT Start Time 1103    PT Stop Time 1143    PT Time Calculation (min) 40 min    Activity Tolerance Patient tolerated treatment well    Behavior During Therapy [facility] for tasks assessed/performed           Past Medical History:  Diagnosis Date   Abnormal chest x-ray    CXR c/w COPD. Full PFTs '11 - normal   Allergic rhinitis, cause unspecified    Allergy    seasonal   Anxiety    Arthritis    both knees, post traumatic - neck   Cataract    removed bilateral   Depression    with anxiety.   Depression    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   Migraines    during teenage years   MVP (mitral valve prolapse)    last 2D echo approx '10   Osteopenia    DEXA 03/2012: -1.0    Past Surgical History:  Procedure Laterality Date   APPENDECTOMY     BUNIONECTOMY WITH HAMMERTOE RECONSTRUCTION Left 07/06/13   [facility]   CARDIAC CATHETERIZATION  2006   normal study   CARPAL TUNNEL RELEASE  2011   right hand   HYSTEROPLASTY REPAIR OF UTERINE ANOMALY     2003 - laproscopically; 2006 - laparotomy   LAPAROSCOPIC ENDOMETRIOSIS FULGURATION     1996 and 2002   MANDIBLE RECONSTRUCTION     NASAL SEPTUM SURGERY  11/2012   [facility] FINGER RELEASE  2011   R thimb and ring finger   TRIGGER FINGER RELEASE  02/04/2012   [facility]ramig, in office    There were no vitals filed for this  visit.   Subjective Assessment - 03/29/20 1103    Subjective "I am doing pretty good, I had pliates yesterday and on my way there I felt a twing in the front of the shoulder, not sure what happened."    Diagnostic tests 02/17/2020 [place], Limited musculoskeletal ultrasound performed and interpreted by Lyndal Pulley Limited ultrasound shows the patient does have a very small partial tear of the supraspinatus on the anterior aspect.  Patient also has hypoechoic changes of the bicep tendon near the insertion on the anterior labrum that is approach appreciated.  Acromioclavicular appears to be unremarkable.  Does have some osteopenia notedImpression: Partial tear of rotator cuff with capsulitis and bicep tendinitis    Patient Stated Goals strengthen the shoulder, decrease pain, returning to gardening and other personal goals.    Currently in Pain? Yes    Pain Score 4     Pain Orientation Right              [facility] PT Assessment - 03/29/20 0001      Assessment   Medical Diagnosis Acute pain of right shoulder M25.511  [facility] Adult PT Treatment/Exercise - 03/29/20 0001      Elbow Exercises   Elbow Flexion Strengthening;Right;Supine   eccentric loading x 5 sebicep curl only with green theraband     Shoulder Exercises: Supine   Flexion Strengthening;Both;10 reps   x 2 sets   Flexion Limitations with sustained abudctions combined with flexion for lower trap activation      Shoulder Exercises: Seated   Other Seated Exercises tricep extension 2 x 10 with red hteraband      Modalities   Modalities Iontophoresis      Iontophoresis   Type of Iontophoresis Dexamethasone    Location R proximal long head of biceps    Dose 4mg /ml    Time 6 hour      Manual Therapy   Manual therapy comments MTPR along the bicpes brachii    Soft tissue mobilization IASTM along along the biceps brachii and long head biceps                    PT Short Term Goals -  03/15/20 1108      PT SHORT TERM GOAL #1   Title pt to be I with initial HEP    Status On-going             PT Long Term Goals - 02/28/20 1134      PT LONG TERM GOAL #1   Title increase R shulder flexion/ abduction to >/= 130 degrees for functional ROM required for ADLs    Time 6    Period Weeks    Status New    Target Date 04/10/20      PT LONG TERM GOAL #2   Title pt increase R shoulder strength to >/= 4+/5 to assist with shoulder stability with </= 2/10 pain during testing    Time 6    Period Weeks    Status New    Target Date 04/10/20      PT LONG TERM GOAL #3   Title pt to be able to lift/ lower >/= 10# to/from and overhead shelf, and to/from floor to waist with </= 2/10 pain for pt's goals of returning to gardening    Time 6    Period Weeks    Status New    Target Date 04/10/20      PT LONG TERM GOAL #4   Title increase FOTO score to </= 31% limited to demo improvement in function    Time 6    Period Weeks    Status New    Target Date 04/10/20      PT LONG TERM GOAL #5   Title pt to be IND with all HEP to maintain and progress current LOF IND    Time 6    Period Weeks    Status New    Target Date 04/10/20                 Plan - 03/29/20 1142    Clinical Impression Statement pt reported mild increaesd in soreness in the anterior aspect of the R shoulder with no specific onset. continued MTPR for the biceps brachii and IASTM along the bicep and long head bicep tendon. she did well with eccentric loading of the bicep and scpaualr stability strengthening in supine. educated and consent was provided for iontophroesis to continue to calm down bicep tendon soreness.    PT Treatment/Interventions ADLs/Self Care Home Management;Electrical Stimulation;Iontophoresis 4mg /ml Dexamethasone;Moist Heat;Ultrasound;Therapeutic exercise;Therapeutic activities;Balance training;Functional mobility training;Neuromuscular re-education;Patient/family education;Manual  techniques;Passive range of motion;Dry needling;Taping    PT Next Visit Plan review/ update HEP PRN, . scapular stab, gross shoulder strengthing, manual as needed ,  pilates, dynamic scap stability, farmers carry, response to ionto    PT Home Exercise Plan [id number] - shoulder internal rotation/ external rotation, rows, scaption, and upper trap stretch    Consulted and Agree with Plan of Care Patient           Patient will benefit from skilled therapeutic intervention in order to improve the following deficits and impairments:  Improper body mechanics, Increased muscle spasms, Postural dysfunction, Pain, Decreased activity tolerance, Decreased strength, Decreased endurance  Visit Diagnosis: Chronic right shoulder pain  Muscle weakness (generalized)     Problem List Patient Active Problem List   Diagnosis Date Noted   Partial tear of right rotator cuff 02/17/2020   Right shoulder pain 01/14/2020   Left arm pain 06/29/2019   Family history of thyroid cancer 12/19/2017   Gastroesophageal reflux disease 04/22/2016   Routine general medical examination at a health care facility 03/16/2015   Osteopenia 04/06/2012   Depression 11/11/2011   Trigger finger of both hands    IBS (irritable bowel syndrome)    Abnormal chest x-ray    Starr Lake PT, DPT, LAT, ATC  03/29/20  11:45 AM      [facility] [address] [place], [place], [address] Phone: [phone]   Fax:  [phone]  Name: Renee [name] MRN: [id number] Date of Birth: 02-13-1961

## 2020-03-29 NOTE — Patient Instructions (Signed)

## 2020-04-05 ENCOUNTER — Ambulatory Visit: Payer: Federal, State, Local not specified - PPO | Attending: Family Medicine | Admitting: Physical Therapy

## 2020-04-05 ENCOUNTER — Encounter: Payer: Self-pay | Admitting: Physical Therapy

## 2020-04-05 ENCOUNTER — Other Ambulatory Visit: Payer: Self-pay

## 2020-04-05 DIAGNOSIS — M6281 Muscle weakness (generalized): Secondary | ICD-10-CM | POA: Insufficient documentation

## 2020-04-05 DIAGNOSIS — M25511 Pain in right shoulder: Secondary | ICD-10-CM | POA: Insufficient documentation

## 2020-04-05 DIAGNOSIS — G8929 Other chronic pain: Secondary | ICD-10-CM

## 2020-04-05 NOTE — Therapy (Signed)
[place], [place], [address] Phone: [phone]   Fax:  [phone]  Physical Therapy Treatment  Patient Details  Name: Renee [name] MRN: [id number] Date of Birth: 05-Aug-1960 Referring Provider (PT): Lyndal Pulley, DO    Encounter Date: 04/05/2020   PT End of Session - 04/05/20 1149    Visit Number 8    Number of Visits 13    Date for PT Re-Evaluation 04/10/20    Authorization Type BCBS    PT Start Time 1147    PT Stop Time 1228    PT Time Calculation (min) 41 min    Activity Tolerance Patient tolerated treatment well    Behavior During Therapy [facility] for tasks assessed/performed           Past Medical History:  Diagnosis Date  . Abnormal chest x-ray    CXR c/w COPD. Full PFTs '11 - normal  . Allergic rhinitis, cause unspecified   . Allergy    seasonal  . Anxiety   . Arthritis    both knees, post traumatic - neck  . Cataract    removed bilateral  . Depression    with anxiety.  . Depression   . Dyslipidemia   . Hx of colonic polyps 2008  . IBS (irritable bowel syndrome)   . 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 Migraines    during teenage years  . MVP (mitral valve prolapse)    last 2D echo approx '10  . Osteopenia    DEXA 03/2012: -1.0    Past Surgical History:  Procedure Laterality Date  . APPENDECTOMY    . BUNIONECTOMY WITH HAMMERTOE RECONSTRUCTION Left 07/06/13   [facility]  . CARDIAC CATHETERIZATION  2006   normal study  . CARPAL TUNNEL RELEASE  2011   right hand  . HYSTEROPLASTY REPAIR OF UTERINE ANOMALY     2003 - laproscopically; 2006 - laparotomy  . LAPAROSCOPIC ENDOMETRIOSIS FULGURATION     1996 and 2002  . MANDIBLE RECONSTRUCTION    . NASAL SEPTUM SURGERY  11/2012   [name]  . TRIGGER FINGER RELEASE  2011   R thimb and ring finger  . TRIGGER FINGER RELEASE  02/04/2012   Gramig, in office    There were no vitals filed for this  visit.   Subjective Assessment - 04/05/20 1149    Subjective " I am doing pretty good. I did so some gardening which wasn't bad, but I did do some lifting at [employer] bags of fertilizer and was sore a little bit.    Diagnostic tests 02/17/2020 [place], Limited musculoskeletal ultrasound performed and interpreted by Lyndal Pulley Limited ultrasound shows the patient does have a very small partial tear of the supraspinatus on the anterior aspect.  Patient also has hypoechoic changes of the bicep tendon near the insertion on the anterior labrum that is approach appreciated.  Acromioclavicular appears to be unremarkable.  Does have some osteopenia notedImpression: Partial tear of rotator cuff with capsulitis and bicep tendinitis    Currently in Pain? Yes    Pain Score 0-No pain    Pain Orientation Right    Pain Type Chronic pain              [facility] PT Assessment - 04/05/20 0001      Assessment   Medical Diagnosis Acute pain of right shoulder M25.511    Referring Provider (PT) Lyndal Pulley, DO  [facility] Adult PT Treatment/Exercise - 04/05/20 0001      Shoulder Exercises: Seated   Other Seated Exercises scapular retraction with ER 2 x 15 with green band      Shoulder Exercises: Standing   Row Strengthening;Both;20 reps;Theraband    Theraband Level (Shoulder Row) Level 3 (Green)      Shoulder Exercises: ROM/Strengthening   UBE (Upper Arm Bike) L4 x 6 min    fwd/bwd x 3 min   Other ROM/Strengthening Exercises Quadruped shoulder I/Y/T bilaterally - 2x15      Shoulder Exercises: Stretch   Corner Stretch 2 reps;30 seconds   door way   Other Shoulder Stretches 3- way bicep stretch 1 x 30 sec ea.    Other Shoulder Stretches upper trap/ levator scapulae stretch R only 2 x 30 secon      Iontophoresis   Type of Iontophoresis Dexamethasone    Location R proximal long head of biceps    Dose 4mg /ml    Time 6 hour      Manual Therapy   Soft tissue  mobilization IASTM along along the biceps brachii and long head biceps                    PT Short Term Goals - 03/15/20 1108      PT SHORT TERM GOAL #1   Title pt to be I with initial HEP    Status On-going             PT Long Term Goals - 02/28/20 1134      PT LONG TERM GOAL #1   Title increase R shulder flexion/ abduction to >/= 130 degrees for functional ROM required for ADLs    Time 6    Period Weeks    Status New    Target Date 04/10/20      PT LONG TERM GOAL #2   Title pt increase R shoulder strength to >/= 4+/5 to assist with shoulder stability with </= 2/10 pain during testing    Time 6    Period Weeks    Status New    Target Date 04/10/20      PT LONG TERM GOAL #3   Title pt to be able to lift/ lower >/= 10# to/from and overhead shelf, and to/from floor to waist with </= 2/10 pain for pt's goals of returning to gardening    Time 6    Period Weeks    Status New    Target Date 04/10/20      PT LONG TERM GOAL #4   Title increase FOTO score to </= 31% limited to demo improvement in function    Time 6    Period Weeks    Status New    Target Date 04/10/20      PT LONG TERM GOAL #5   Title pt to be IND with all HEP to maintain and progress current LOF IND    Time 6    Period Weeks    Status New    Target Date 04/10/20                 Plan - 04/05/20 1234    Clinical Impression Statement mrs Shehadeh reports no pain today and has been able to do all exercise a activities at home with limited pain or irriation. continued working on gross shoulder strengthening and STW techniques to relieve bicep tension. she did very well with scapular stability strengthing. finished session with application of iontophoresis.  PT Treatment/Interventions ADLs/Self Care Home Management;Electrical Stimulation;Iontophoresis 4mg /ml Dexamethasone;Moist Heat;Ultrasound;Therapeutic exercise;Therapeutic activities;Balance training;Functional mobility  training;Neuromuscular re-education;Patient/family education;Manual techniques;Passive range of motion;Dry needling;Taping    PT Next Visit Plan ERO vs D/C,. scapular stab, gross shoulder strengthing, manual as needed ,  pilates, dynamic scap stability, farmers carry, response to ionto    PT Home Exercise Plan [id number] - shoulder internal rotation/ external rotation, rows, scaption, and upper trap stretch    Consulted and Agree with Plan of Care Patient           Patient will benefit from skilled therapeutic intervention in order to improve the following deficits and impairments:  Improper body mechanics, Increased muscle spasms, Postural dysfunction, Pain, Decreased activity tolerance, Decreased strength, Decreased endurance  Visit Diagnosis: Chronic right shoulder pain  Muscle weakness (generalized)     Problem List Patient Active Problem List   Diagnosis Date Noted  . Partial tear of right rotator cuff 02/17/2020  . Right shoulder pain 01/14/2020  . Left arm pain 06/29/2019  . Family history of thyroid cancer 12/19/2017  . Gastroesophageal reflux disease 04/22/2016  . Routine general medical examination at a health care facility 03/16/2015  . Osteopenia 04/06/2012  . Depression 11/11/2011  . Trigger finger of both hands   . IBS (irritable bowel syndrome)   . Abnormal chest x-ray    Starr Lake PT, DPT, LAT, ATC  04/05/20  12:36 PM      [facility] [address] [place], [place], [address] Phone: [phone]   Fax:  [phone]  Name: Renee [name] MRN: [id number] Date of Birth: 1960-08-18

## 2020-04-07 ENCOUNTER — Ambulatory Visit: Payer: Federal, State, Local not specified - PPO | Admitting: Physical Therapy

## 2020-04-07 ENCOUNTER — Other Ambulatory Visit: Payer: Self-pay

## 2020-04-07 ENCOUNTER — Encounter: Payer: Self-pay | Admitting: Physical Therapy

## 2020-04-07 DIAGNOSIS — M6281 Muscle weakness (generalized): Secondary | ICD-10-CM

## 2020-04-07 DIAGNOSIS — G8929 Other chronic pain: Secondary | ICD-10-CM

## 2020-04-07 DIAGNOSIS — M25511 Pain in right shoulder: Secondary | ICD-10-CM | POA: Diagnosis not present

## 2020-04-07 NOTE — Therapy (Signed)
[place], [place], [address] Phone: [phone]   Fax:  [phone]  Physical Therapy Treatment / Discharge  Patient Details  Name: Renee [name] MRN: [id number] Date of Birth: 1960-08-11 Referring Provider (PT): Lyndal Pulley, DO    Encounter Date: 04/07/2020   PT End of Session - 04/07/20 1147    Visit Number 9    Number of Visits 13    Date for PT Re-Evaluation 04/10/20    Authorization Type BCBS    PT Start Time 1146    PT Stop Time 1218    PT Time Calculation (min) 32 min    Activity Tolerance Patient tolerated treatment well    Behavior During Therapy [facility] for tasks assessed/performed           Past Medical History:  Diagnosis Date  . Abnormal chest x-ray    CXR c/w COPD. Full PFTs '11 - normal  . Allergic rhinitis, cause unspecified   . Allergy    seasonal  . Anxiety   . Arthritis    both knees, post traumatic - neck  . Cataract    removed bilateral  . Depression    with anxiety.  . Depression   . Dyslipidemia   . Hx of colonic polyps 2008  . IBS (irritable bowel syndrome)   . 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 Migraines    during teenage years  . MVP (mitral valve prolapse)    last 2D echo approx '10  . Osteopenia    DEXA 03/2012: -1.0    Past Surgical History:  Procedure Laterality Date  . APPENDECTOMY    . BUNIONECTOMY WITH HAMMERTOE RECONSTRUCTION Left 07/06/13   [facility]  . CARDIAC CATHETERIZATION  2006   normal study  . CARPAL TUNNEL RELEASE  2011   right hand  . HYSTEROPLASTY REPAIR OF UTERINE ANOMALY     2003 - laproscopically; 2006 - laparotomy  . LAPAROSCOPIC ENDOMETRIOSIS FULGURATION     1996 and 2002  . MANDIBLE RECONSTRUCTION    . NASAL SEPTUM SURGERY  11/2012   [name]  . TRIGGER FINGER RELEASE  2011   R thimb and ring finger  . TRIGGER FINGER RELEASE  02/04/2012   [facility]ig, in office    There were no vitals filed for  this visit.   Subjective Assessment - 04/07/20 1149    Subjective " I am doing pretty good, still a slight twinge with reaching up but nothing significant."    Diagnostic tests 02/17/2020 [place], Limited musculoskeletal ultrasound performed and interpreted by Lyndal Pulley Limited ultrasound shows the patient does have a very small partial tear of the supraspinatus on the anterior aspect.  Patient also has hypoechoic changes of the bicep tendon near the insertion on the anterior labrum that is approach appreciated.  Acromioclavicular appears to be unremarkable.  Does have some osteopenia notedImpression: Partial tear of rotator cuff with capsulitis and bicep tendinitis    Patient Stated Goals strengthen the shoulder, decrease pain, returning to gardening and other personal goals.    Currently in Pain? No/denies              [facility] PT Assessment - 04/07/20 0001      Assessment   Medical Diagnosis Acute pain of right shoulder M25.511    Referring Provider (PT) Lyndal Pulley, DO       Observation/Other Assessments   Focus on Therapeutic Outcomes (FOTO)  20% limited  AROM   Right Shoulder Flexion 138 Degrees    Right Shoulder ABduction 132 Degrees      Strength   Right Shoulder Flexion 4+/5    Right Shoulder Extension 5/5    Right Shoulder ABduction 4/5    Right Shoulder Internal Rotation 5/5    Right Shoulder External Rotation 4+/5                         [facility] Adult PT Treatment/Exercise - 04/07/20 0001      Shoulder Exercises: Standing   Row Strengthening;Theraband;10 reps    Theraband Level (Shoulder Row) Level 4 (Blue)    Other Standing Exercises scapular retraction with ER 1  x 10 blue band      Iontophoresis   Type of Iontophoresis Dexamethasone    Location R proximal long head of biceps    Dose 41m/ml    Time 6 hour                  PT Education - 04/07/20 1225    Education Details Reviewed final FOTO assessment, reivewed HEP and how to  appropriately progress strength/ endurnace with increaesd reps, sets and resistance.    Person(s) Educated Patient    Methods Explanation;Verbal cues    Comprehension Verbalized understanding            PT Short Term Goals - 04/07/20 1155      PT SHORT TERM GOAL #1   Title pt to be I with initial HEP    Period Weeks    Status Achieved             PT Long Term Goals - 04/07/20 1155      PT LONG TERM GOAL #1   Title increase R shulder flexion/ abduction to >/= 130 degrees for functional ROM required for ADLs    Period Weeks    Status Achieved      PT LONG TERM GOAL #2   Title pt increase R shoulder strength to >/= 4+/5 to assist with shoulder stability with </= 2/10 pain during testing    Baseline 4/5 was abduction 1/10 paing    Period Weeks    Status Partially Met      PT LONG TERM GOAL #3   Title pt to be able to lift/ lower >/= 10# to/from and overhead shelf, and to/from floor to waist with </= 2/10 pain for pt's goals of returning to gardening    Period Weeks    Status Achieved      PT LONG TERM GOAL #4   Title increase FOTO score to </= 31% limited to demo improvement in function    Period Weeks    Status Achieved      PT LONG TERM GOAL #5   Title pt to be IND with all HEP to maintain and progress current LOF IND    Period Weeks    Status Achieved                 Plan - 04/07/20 1222    Clinical Impression Statement Mrs PBreidenbachhas made excellent progress with physical therapy increase both R shoulder strength and ROM with max of 1/10. reviewed HEP and discussed appropriate progression. She was able to do exericse with no report of pain, and reapplied 1 more iontophoresis patch to calm down soreness. She met or partially met all goals today and is able to maintain and progress her current LOF IND and  will be discharged from PT today.    PT Treatment/Interventions ADLs/Self Care Home Management;Electrical Stimulation;Iontophoresis 34m/ml  Dexamethasone;Moist Heat;Ultrasound;Therapeutic exercise;Therapeutic activities;Balance training;Functional mobility training;Neuromuscular re-education;Patient/family education;Manual techniques;Passive range of motion;Dry needling;Taping    PT Next Visit Plan DC    PT Home Exercise Plan [id number] - shoulder internal rotation/ external rotation, rows, scaption, and upper trap stretch    Consulted and Agree with Plan of Care Patient           Patient will benefit from skilled therapeutic intervention in order to improve the following deficits and impairments:  Improper body mechanics, Increased muscle spasms, Postural dysfunction, Pain, Decreased activity tolerance, Decreased strength, Decreased endurance  Visit Diagnosis: Chronic right shoulder pain  Muscle weakness (generalized)     Problem List Patient Active Problem List   Diagnosis Date Noted  . Partial tear of right rotator cuff 02/17/2020  . Right shoulder pain 01/14/2020  . Left arm pain 06/29/2019  . Family history of thyroid cancer 12/19/2017  . Gastroesophageal reflux disease 04/22/2016  . Routine general medical examination at a health care facility 03/16/2015  . Osteopenia 04/06/2012  . Depression 11/11/2011  . Trigger finger of both hands   . IBS (irritable bowel syndrome)   . Abnormal chest x-ray     KStarr LakePT, DPT, LAT, ATC  04/07/20  12:26 PM      [facility]GWellington N[place] [address]Phone: [phone]  Fax:  [phone] Name: AKaylei FrinkMRN: [id number] of Birth: [birth year]       PHYSICAL THERAPY DISCHARGE SUMMARY  Visits from Start of Care: 9  Current functional level related to goals / functional outcomes: See goals, FOTO 20% limited    Remaining deficits: See assessment   Education / Equipment: HEP, theraband, efficient posture and lifting mechanics  Plan: Patient agrees to discharge.   Patient goals were met. Patient is being discharged due to being pleased with the current functional level.  ?????         Javone Ybanez PT, DPT, LAT, ATC  04/07/20  12:27 PM

## 2020-04-11 ENCOUNTER — Encounter: Payer: Self-pay | Admitting: Family Medicine

## 2020-04-11 ENCOUNTER — Other Ambulatory Visit: Payer: Self-pay

## 2020-04-11 ENCOUNTER — Ambulatory Visit: Payer: Self-pay

## 2020-04-11 ENCOUNTER — Ambulatory Visit: Payer: Federal, State, Local not specified - PPO | Admitting: Family Medicine

## 2020-04-11 VITALS — BP 130/80 | HR 70 | Ht 67.5 in | Wt 142.0 lb

## 2020-04-11 DIAGNOSIS — M75111 Incomplete rotator cuff tear or rupture of right shoulder, not specified as traumatic: Secondary | ICD-10-CM | POA: Diagnosis not present

## 2020-04-11 DIAGNOSIS — M25511 Pain in right shoulder: Secondary | ICD-10-CM

## 2020-04-11 DIAGNOSIS — G8929 Other chronic pain: Secondary | ICD-10-CM | POA: Diagnosis not present

## 2020-04-11 NOTE — Patient Instructions (Addendum)
Good to see you Injection today Continue exercising 2-3 times a week Send message in 1 month Keep hands within peripheral vision See me again in 2 months

## 2020-04-11 NOTE — Progress Notes (Signed)
[place] [address] [place] Phone: [phone] Subjective:   I [name] am serving as a Education administrator for Dr. Hulan Saas.  This visit occurred during the SARS-CoV-2 public health emergency.  Safety protocols were in place, including screening questions prior to the visit, additional usage of staff PPE, and extensive cleaning of exam room while observing appropriate contact time as indicated for disinfecting solutions.   I'm seeing this patient by the request  of:  [name], MD  CC: Right shoulder pain  [id number]   02/17/2020 Partial tear noted.  Patient elected to try conservative therapy, referred to formal physical therapy, icing regimen, home exercise, increase activity slowly.  Discussed which activities to do which wants to avoid.  Patient will follow up again in 6 weeks.  At that time if worsening symptoms I would like to consider the possibility of an injection.  We will need x-rays at that time as well.  Hopefully will do well with conservative therapy  Update 04/11/2020 Laterria Lasota is a 59 y.o. female coming in with complaint of right shoulder pain. Patient states she is doing well. Did PT for a month and believes it helped. Still gets pain with certain ROM.  Patient was seen approximately 85 to 90% better.  Patient states still with certain range of motion has some difficulty.  Has not noticed any weakness.  Has been doing Pilates fairly regularly.    been in PT, partial tear of rotator cuff   Past Medical History:  Diagnosis Date  . Abnormal chest x-ray    CXR c/w COPD. Full PFTs '11 - normal  . Allergic rhinitis, cause unspecified   . Allergy    seasonal  . Anxiety   . Arthritis    both knees, post traumatic - neck  . Cataract    removed bilateral  . Depression    with anxiety.  . Depression   . Dyslipidemia   . Hx of colonic polyps 2008  . IBS (irritable bowel syndrome)   . Infertility,  female    failed 3 attempts at in vitro fertilization  . Migraines    during teenage years  . MVP (mitral valve prolapse)    last 2D echo approx '10  . Osteopenia    DEXA 03/2012: -1.0   Past Surgical History:  Procedure Laterality Date  . APPENDECTOMY    . BUNIONECTOMY WITH HAMMERTOE RECONSTRUCTION Left 07/06/13   [facility]  . CARDIAC CATHETERIZATION  2006   normal study  . CARPAL TUNNEL RELEASE  2011   right hand  . HYSTEROPLASTY REPAIR OF UTERINE ANOMALY     2003 - laproscopically; 2006 - laparotomy  . LAPAROSCOPIC ENDOMETRIOSIS FULGURATION     1996 and 2002  . MANDIBLE RECONSTRUCTION    . NASAL SEPTUM SURGERY  11/2012   [name]  . TRIGGER FINGER RELEASE  2011   R thimb and ring finger  . TRIGGER FINGER RELEASE  02/04/2012   [place]ig, in office   Social History   Socioeconomic History  . Marital status: Married    Spouse name: Not on file  . Number of children: 0  . Years of education: 9  . Highest education level: Not on file  Occupational History  . 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 Smoking status: Never Smoker  . Smokeless tobacco: Never Used  Substance and Sexual Activity  . Alcohol use: Yes  Comment: social  . Drug use: No  . Sexual activity: Yes    Partners: Male  Other Topics Concern  . Not on file  Social History Narrative   Graduated from [employer] with degree in Food science/nutrition.   Married '94.   Denies any physical or sexual abuse.   Family in [place]: parents and sister.   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   Has smoke alarms, wears seatbelt at all times, uses helmut when appropriate, firearms present in the home, uses herbal remedies, caffeine- 2-3 per day.   No regular exercise.   Social Determinants of Health   Financial Resource Strain:   . Difficulty of Paying Living Expenses: Not on file  Food Insecurity:   . Worried About Charity fundraiser in the Last Year: Not on file  . Ran  Out of Food in the Last Year: Not on file  Transportation Needs:   . Lack of Transportation (Medical): Not on file  . Lack of Transportation (Non-Medical): Not on file  Physical Activity:   . Days of Exercise per Week: Not on file  . Minutes of Exercise per Session: Not on file  Stress:   . Feeling of Stress : Not on file  Social Connections:   . Frequency of Communication with Friends and Family: Not on file  . Frequency of Social Gatherings with Friends and Family: Not on file  . Attends Religious Services: Not on file  . Active Member of Clubs or Organizations: Not on file  . Attends Archivist Meetings: Not on file  . Marital Status: Not on file   Allergies  Allergen Reactions  . Codeine Other (See Comments)    Hot flashes/passed out  . Pollen Extract Other (See Comments)    drainage   Family History  Problem Relation Age of Onset  . Arthritis Mother        DOB 78  . Cancer Mother        Thyroid cancer  . Hypertension Mother   . Atrial fibrillation Mother   . Melanoma Mother   . Breast cancer Mother   . Arthritis Father        DOB 1929  . Melanoma Father   . Colon polyps Neg Hx   . Rectal cancer Neg Hx   . Stomach cancer Neg Hx       Current Outpatient Medications (Respiratory):  .  fluticasone (FLONASE) 50 MCG/ACT nasal spray, Place 2 sprays into both nostrils daily.    Current Outpatient Medications (Other):  .  cyclobenzaprine (FLEXERIL) 5 MG tablet, Take 1 tablet (5 mg total) by mouth 3 (three) times daily as needed for muscle spasms. Marland Kitchen  DIGESTIVE ENZYMES PO, Take 1 tablet by mouth every other day. .  metroNIDAZOLE (METROCREAM) 0.75 % cream,  .  Multiple Vitamin (MULTIVITAMIN WITH MINERALS) TABS tablet, Take 1 tablet by mouth daily. .  pantoprazole (PROTONIX) 40 MG tablet, Take 1 tablet (40 mg total) by mouth daily. .  sertraline (ZOLOFT) 50 MG tablet, TAKE 2 TABLETS BY MOUTH DAILY. .  simethicone (MYLICON) 80 MG chewable tablet, Chew 80 mg  by mouth every 6 (six) hours as needed for flatulence. .  Vitamin D, Ergocalciferol, (DRISDOL) 1.25 MG (50000 UNIT) CAPS capsule, Take 1 capsule (50,000 Units total) by mouth every 7 (seven) days.   Reviewed prior external information including notes and imaging from  primary care provider As well as notes that were available from care everywhere and  other healthcare systems.  Past medical history, social, surgical and family history all reviewed in electronic medical record.  No pertanent information unless stated regarding to the chief complaint.   Review of Systems:  No headache, visual changes, nausea, vomiting, diarrhea, constipation, dizziness, abdominal pain, skin rash, fevers, chills, night sweats, weight loss, swollen lymph nodes, body aches, joint swelling, chest pain, shortness of breath, mood changes. POSITIVE muscle aches  Objective  Blood pressure 130/80, pulse 70, height 5' 7.5" (1.715 m), weight 142 lb (64.4 kg), SpO2 98 %.   General: No apparent distress alert and oriented x3 mood and affect normal, dressed appropriately.  HEENT: Pupils equal, extraocular movements intact  Respiratory: Patient's speak in full sentences and does not appear short of breath  Cardiovascular: No lower extremity edema, non tender, no erythema  Neuro: Cranial nerves II through XII are intact, neurovascularly intact in all extremities with 2+ DTRs and 2+ pulses.  Gait normal with good balance and coordination.  MSK: Right shoulder exam shows the patient still has positive impingement.  Mild O'Brien's test.  Mild crossover.  Lacks last 5 degrees of extension.  Procedure: Real-time Ultrasound Guided Injection of right glenohumeral joint Device: GE Logiq Q7  Ultrasound guided injection is preferred based studies that show increased duration, increased effect, greater accuracy, decreased procedural pain, increased response rate with ultrasound guided versus blind injection.  Verbal informed consent  obtained.  Time-out conducted.  Noted no overlying erythema, induration, or other signs of local infection.  Skin prepped in a sterile fashion.  Local anesthesia: Topical Ethyl chloride.  With sterile technique and under real time ultrasound guidance:  Joint visualized.  23g 1  inch needle inserted posterior approach. Pictures taken for needle placement. Patient did have injection of 2 cc of 1% lidocaine, 2 cc of 0.5% Marcaine, and 1.0 cc of Kenalog 40 mg/dL. Completed without difficulty  Pain immediately resolved suggesting accurate placement of the medication.  Advised to call if fevers/chills, erythema, induration, drainage, or persistent bleeding.  Impression: Technically successful ultrasound guided injection.    Impression and Recommendations:     The above documentation has been reviewed and is accurate and complete Lyndal Pulley, DO

## 2020-04-11 NOTE — Assessment & Plan Note (Signed)
Injected today.  Has been doing relatively well but I do think that this will be helpful.  We discussed icing regimen and home exercises.  Patient will do more the home exercises after doing all the physical therapy.  Patient is not completely better in about 1 to 2 months I do think we should consider the MRI.  Patient is in agreement and will have a follow-up scheduled in 2 months as well

## 2020-04-12 ENCOUNTER — Other Ambulatory Visit: Payer: Self-pay | Admitting: Internal Medicine

## 2020-04-13 ENCOUNTER — Ambulatory Visit: Payer: Federal, State, Local not specified - PPO | Admitting: Internal Medicine

## 2020-04-18 NOTE — Progress Notes (Signed)
Renee Bison, MD Reason for referral-palpitations  HPI: 59 year old female for evaluation of palpitations at request of Pricilla Holm, MD.  Echocardiogram August 2011 showed normal LV function, mitral valve prolapse and trace mitral regurgitation.  Laboratories September 2021 showed hemoglobin 13.6, potassium 4.6 and normal renal function.  Patient states she has had intermittent palpitations since she was 59 years old.  She has noticed them more frequently recently.  They are described as a skip and "heartbeat".  They are not sustained.  Occasional dizziness associated with these.  She has some dyspnea with lying flat but can do her daily activities with no dyspnea.  No pedal edema.  No chest pain or syncope.  Cardiology now asked to evaluate.  Current Outpatient Medications  Medication Sig Dispense Refill   aspirin EC 81 MG tablet Take 81 mg by mouth daily. Swallow whole.     cetirizine (ZYRTEC) 10 MG tablet Take 10 mg by mouth daily.     DIGESTIVE ENZYMES PO Take 1 tablet by mouth every other day.     fluticasone (FLONASE) 50 MCG/ACT nasal spray Place 2 sprays into both nostrils daily. 48 g 3   Multiple Vitamin (MULTIVITAMIN WITH MINERALS) TABS tablet Take 1 tablet by mouth daily.     pantoprazole (PROTONIX) 40 MG tablet Take 1 tablet (40 mg total) by mouth daily. 90 tablet 0   sertraline (ZOLOFT) 50 MG tablet TAKE 2 TABLETS BY MOUTH DAILY. 180 tablet 1   simethicone (MYLICON) 80 MG chewable tablet Chew 80 mg by mouth every 6 (six) hours as needed for flatulence.     Vitamin D, Ergocalciferol, (DRISDOL) 1.25 MG (50000 UNIT) CAPS capsule Take 1 capsule (50,000 Units total) by mouth every 7 (seven) days. 12 capsule 0   No current facility-administered medications for this visit.    Allergies  Allergen Reactions   Codeine Other (See Comments)    Hot flashes/passed out   Pollen Extract Other (See Comments)    drainage     Past Medical History:   Diagnosis Date   Abnormal chest x-ray    CXR c/w COPD. Full PFTs '11 - normal   Allergy    seasonal   Anxiety    Arthritis    both knees, post traumatic - neck   Cataract    removed bilateral   Depression    with anxiety.   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   Migraines    during teenage years   MVP (mitral valve prolapse)    last 2D echo approx '10   Osteopenia    DEXA 03/2012: -1.0    Past Surgical History:  Procedure Laterality Date   APPENDECTOMY     BUNIONECTOMY WITH HAMMERTOE RECONSTRUCTION Left 07/06/13   [facility]   CARDIAC CATHETERIZATION  2006   normal study   CARPAL TUNNEL RELEASE  2011   right hand   HYSTEROPLASTY REPAIR OF UTERINE ANOMALY     2003 - laproscopically; 2006 - laparotomy   LAPAROSCOPIC ENDOMETRIOSIS FULGURATION     1996 and 2002   MANDIBLE RECONSTRUCTION     NASAL SEPTUM SURGERY  11/2012   [facility] FINGER RELEASE  2011   R thimb and ring finger   TRIGGER FINGER RELEASE  02/04/2012   [facility], in office    Social History   Socioeconomic History   Marital  status: Married    Spouse name: Not on file   Number of children: 0   Years of education: 65   Highest education level: Not on file  Occupational History   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   Married '94.   Denies any physical or sexual abuse.   Family in [place]: parents and sister.   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   Has smoke alarms, wears seatbelt at all  times, uses helmut when appropriate, firearms present in the home, uses herbal remedies, caffeine- 2-3 per day.   No regular exercise.   Social Determinants of Health   Financial Resource Strain:    Difficulty of Paying Living Expenses: Not on file  Food Insecurity:    Worried About Charity fundraiser in the Last Year: Not on file   [employer] of Food in the Last Year: Not on file  Transportation Needs:    Lack of Transportation (Medical): Not on file   Lack of Transportation (Non-Medical): Not on file  Physical Activity:    Days of Exercise per Week: Not on file   Minutes of Exercise per Session: Not on file  Stress:    Feeling of Stress : Not on file  Social Connections:    Frequency of Communication with Friends and Family: Not on file   Frequency of Social Gatherings with Friends and Family: Not on file   Attends Religious Services: Not on file   Active Member of Clubs or Organizations: Not on file   Attends Archivist Meetings: Not on file   Marital Status: Not on file  Intimate Partner Violence:    Fear of Current or Ex-Partner: Not on file   Emotionally Abused: Not on file   Physically Abused: Not on file   Sexually Abused: Not on file    Family History  Problem Relation Age of Onset   Arthritis Mother        DOB 44   Cancer Mother        Thyroid cancer   Hypertension Mother    Atrial fibrillation Mother    Melanoma Mother    Breast cancer Mother    Arthritis Father        DOB 41   Melanoma Father    Colon polyps Neg Hx    Rectal cancer Neg Hx    Stomach cancer Neg Hx     ROS: no fevers or chills, productive cough, hemoptysis, dysphasia, odynophagia, melena, hematochezia, dysuria, hematuria, rash, seizure activity, orthopnea, PND, pedal edema, claudication. Remaining systems are negative.  Physical Exam:   Blood pressure 116/72, pulse 62, height 5' 7.5" (1.715 m), weight 143 lb 12.8 oz (65.2 kg), SpO2 98  %.  General:  Well developed/well nourished in NAD Skin warm/dry Patient not depressed No peripheral clubbing Back-normal HEENT-normal/normal eyelids Neck supple/normal carotid upstroke bilaterally; no bruits; no JVD; no thyromegaly chest - CTA/ normal expansion CV - RRR/normal S1 and S2; no murmurs, rubs or gallops;  PMI nondisplaced Abdomen -NT/ND, no HSM, no mass, + bowel sounds,  no bruit 2+ femoral pulses, no bruits Ext-no edema, chords, 2+ DP Neuro-grossly nonfocal  ECG -normal sinus rhythm at a rate of 62, right bundle branch block.  Personally reviewed  A/P  1 palpitations-symptoms sound likely to be PACs or PVCs.  She has a cardiamobile device and will send me strips at the time she has her symptoms.  Plan to repeat echocardiogram.  We will consider beta-blocker in the future if needed.  2 mitral valve prolapse-patient with history of MVP.  We will repeat echocardiogram to reassess.  3 hyperlipidemia-Per primary care.  Kirk Ruths, MD

## 2020-04-20 ENCOUNTER — Encounter: Payer: Self-pay | Admitting: Neurology

## 2020-04-20 ENCOUNTER — Ambulatory Visit: Payer: Federal, State, Local not specified - PPO | Admitting: Neurology

## 2020-04-20 VITALS — BP 108/61 | HR 78 | Ht 67.5 in | Wt 143.0 lb

## 2020-04-20 DIAGNOSIS — L710 Perioral dermatitis: Secondary | ICD-10-CM | POA: Diagnosis not present

## 2020-04-20 DIAGNOSIS — R0683 Snoring: Secondary | ICD-10-CM | POA: Diagnosis not present

## 2020-04-20 DIAGNOSIS — D225 Melanocytic nevi of trunk: Secondary | ICD-10-CM | POA: Diagnosis not present

## 2020-04-20 DIAGNOSIS — L814 Other melanin hyperpigmentation: Secondary | ICD-10-CM | POA: Diagnosis not present

## 2020-04-20 NOTE — Progress Notes (Signed)
SLEEP MEDICINE CLINIC    Provider:  Larey Seat, MD  Primary Care Physician:  [name], MD [place] [address]     Referring Provider: [name], [place],  [place] [address]          Chief Complaint according to patient   Patient presents with:    . New Patient (Initial Visit)     pt alone, rm 10. presents today to discuss not feeling well rested after sleeping. never had a SS. states that she has been told she snores. she admits to having something heart palpitations and wore a heart monitor. she has this feeling of not feeling like she gets enough air.      HISTORY OF PRESENT ILLNESS:  Renee [name] is a 59 year-old Caucasian female patient and is seen here upon a request for a sleep consultation on 04/20/2020 from Dr. Sharlet Salina, MD.   Chief concern according to patient :  Renee [name] reports that she has been snoring morning more loudly as has been witnessed by her husband.  She is not feeling as well rested and as restored in the morning either.  In addition she has noted heart palpitations and associated shortness of breath which occurs in daytime without warning and is not related to physical exertion.  She describes an example where she was shopping with another family member and had an episode of palpitations while looking at some clothes.  There was no orthostatic change and she was not standing in one place for long time.  She was neither exposed to extreme heat or felt dehydrated at the time.  Some of these palpitations have already occurred in her 30s and 29s.  She is physically active and participates in Pilates and in August and September had regular physical therapy for a rotator cuff injury of the right shoulder.   I have the pleasure of seeing Renee [name] today, a right-handed White or Caucasian female with a possible sleep disorder.  She  has a past medical history of Abnormal chest x-ray, Allergic  rhinitis, cause unspecified, Allergy, Anxiety, Arthritis, Cataract, Depression, Depression, Dyslipidemia, colonic polyps (2008), IBS (irritable bowel syndrome), Infertility, female, Migraines, MVP (mitral valve prolapse), and Osteopenia.    Sleep relevant medical history: Nocturia once at 5 AM, deviated septum repair.GERD- nasal congestion    Family medical /sleep history: No other family member on CPAP with OSA.mother is 26 and has memory loss, hearing loss.   Social history: Patient is retired from [employer] in [place]-  and lives in a household with spouse , watches her grandchildren in daytime. Stepdaughter with kids moved back in.   Family status is married -  with children and grandchildren.  Pets are present. Tobacco use; none .  ETOH use; 1-2 / week , Caffeine intake in form of Coffee( 2 cups in AM ) Soda( rarely) Tea ( green tea in Pm, 3-4 / week) or energy drinks. Regular exercise in form of pilates.  Hobbies: gardening.      Sleep habits are as follows: The patient's dinner time is between 5-7 PM.  The patient goes to bed at 12 PM and continues to sleep for 7-8 hours, wakes for one bathroom breaks, the first time at 4-5 AM.   The preferred sleep position is on her left side ( right shoulder was not possible)  , with the support of 1 pillows. Dreams are reportedly frequent. 9 AM is  the usual rise time.  The patient wakes up spontaneously. Snoring witnessed between 4-6  AM.  She reports not feeling refreshed or restored in AM, with symptoms such as dry mouth and some residual fatigue. Naps are taken rarely.    Review of Systems: Out of a complete 14 system review, the patient complains of only the following symptoms, and all other reviewed systems are negative.:  Fatigue, sleepiness , snoring, She also feels she needs longer to find the right words, attention is easily diverted.    How likely are you to doze in the following situations: 0 = not likely, 1 = slight chance, 2 =  moderate chance, 3 = high chance   Sitting and Reading? Watching Television? Sitting inactive in a public place (theater or meeting)? As a passenger in a car for an hour without a break? Lying down in the afternoon when circumstances permit? Sitting and talking to someone? Sitting quietly after lunch without alcohol? In a car, while stopped for a few minutes in traffic?   Total =10-12/ 24 points   FSS endorsed at 33-35/ 63 points.   Social History   Socioeconomic History  . Marital status: Married    Spouse name: Not on file  . Number of children: 0  . Years of education: 45  . Highest education level: Not on file  Occupational History  . 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 Smoking status: Never Smoker  . Smokeless tobacco: Never Used  Substance and Sexual Activity  . Alcohol use: Yes    Comment: social  . Drug use: No  . Sexual activity: Yes    Partners: Male  Other Topics Concern  . Not on file  Social History Narrative   Graduated from [employer] with degree in Food science/nutrition.   Married '94.   Denies any physical or sexual abuse.   Family in : parents and sister.   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   Has smoke alarms, wears seatbelt at all times, uses helmut when appropriate, firearms present in the home, uses herbal remedies, caffeine- 2-3 per day.   No regular exercise.   Social Determinants of Health   Financial Resource Strain:   . Difficulty of Paying Living Expenses: Not on file  Food Insecurity:   . Worried About Charity fundraiser in the Last Year: Not on file  . Ran Out of Food in the Last Year: Not on file  Transportation Needs:   . Lack of Transportation (Medical): Not on file  . Lack of Transportation (Non-Medical): Not on file  Physical Activity:   . Days of Exercise per Week: Not on file  . Minutes of Exercise per Session: Not on file  Stress:   . Feeling of Stress : Not on  file  Social Connections:   . Frequency of Communication with Friends and Family: Not on file  . Frequency of Social Gatherings with Friends and Family: Not on file  . Attends Religious Services: Not on file  . Active Member of Clubs or Organizations: Not on file  . Attends Archivist Meetings: Not on file  . Marital Status: Not on file    Family History  Problem Relation Age of Onset  . Arthritis Mother        DOB 32  . Cancer Mother        Thyroid cancer  . Hypertension Mother   . Atrial fibrillation  Mother   . Melanoma Mother   . Breast cancer Mother   . Arthritis Father        DOB 1929  . Melanoma Father   . Colon polyps Neg Hx   . Rectal cancer Neg Hx   . Stomach cancer Neg Hx     Past Medical History:  Diagnosis Date  . Abnormal chest x-ray    CXR c/w COPD. Full PFTs '11 - normal  . Allergic rhinitis, cause unspecified   . Allergy    seasonal  . Anxiety   . Arthritis    both knees, post traumatic - neck  . Cataract    removed bilateral  . Depression    with anxiety.  . Depression   . Dyslipidemia   . Hx of colonic polyps 2008  . IBS (irritable bowel syndrome)   . 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 Migraines    during teenage years  . MVP (mitral valve prolapse)    last 2D echo approx '10  . Osteopenia    DEXA 03/2012: -1.0    Past Surgical History:  Procedure Laterality Date  . APPENDECTOMY    . BUNIONECTOMY WITH HAMMERTOE RECONSTRUCTION Left 07/06/13   [facility]  . CARDIAC CATHETERIZATION  2006   normal study  . CARPAL TUNNEL RELEASE  2011   right hand  . HYSTEROPLASTY REPAIR OF UTERINE ANOMALY     2003 - laproscopically; 2006 - laparotomy  . LAPAROSCOPIC ENDOMETRIOSIS FULGURATION     1996 and 2002  . MANDIBLE RECONSTRUCTION    . NASAL SEPTUM SURGERY  11/2012   [name]  . TRIGGER FINGER RELEASE  2011   R thimb and ring finger  . TRIGGER FINGER RELEASE  02/04/2012   [facility], in office     Current  Outpatient Medications on File Prior to Visit  Medication Sig Dispense Refill  . aspirin EC 81 MG tablet Take 81 mg by mouth daily. Swallow whole.    . cetirizine (ZYRTEC) 10 MG tablet Take 10 mg by mouth daily.    Marland Kitchen DIGESTIVE ENZYMES PO Take 1 tablet by mouth every other day.    . fluticasone (FLONASE) 50 MCG/ACT nasal spray Place 2 sprays into both nostrils daily. 48 g 3  . Multiple Vitamin (MULTIVITAMIN WITH MINERALS) TABS tablet Take 1 tablet by mouth daily.    . pantoprazole (PROTONIX) 40 MG tablet Take 1 tablet (40 mg total) by mouth daily. 90 tablet 0  . sertraline (ZOLOFT) 50 MG tablet TAKE 2 TABLETS BY MOUTH DAILY. 180 tablet 1  . simethicone (MYLICON) 80 MG chewable tablet Chew 80 mg by mouth every 6 (six) hours as needed for flatulence.    . Vitamin D, Ergocalciferol, (DRISDOL) 1.25 MG (50000 UNIT) CAPS capsule Take 1 capsule (50,000 Units total) by mouth every 7 (seven) days. (Patient not taking: Reported on 04/20/2020) 12 capsule 0   No current facility-administered medications on file prior to visit.    Allergies  Allergen Reactions  . Codeine Other (See Comments)    Hot flashes/passed out  . Pollen Extract Other (See Comments)    drainage    Physical exam:  Today's Vitals   04/20/20 0947  BP: 108/61  Pulse: 78  Weight: 143 lb (64.9 kg)  Height: 5' 7.5" (1.715 m)   Body mass index is 22.07 kg/m.   Wt Readings from Last 3 Encounters:  04/20/20 143 lb (64.9 kg)  04/11/20 142 lb (64.4 kg)  03/15/20 141 lb 9.6 oz (64.2 kg)     Ht Readings from Last 3 Encounters:  04/20/20 5' 7.5" (1.715 m)  04/11/20 5' 7.5" (1.715 m)  03/15/20 5' 7.5" (1.715 m)      General: The patient is awake, alert and appears not in acute distress. The patient is well groomed. Head: Normocephalic, atraumatic. Neck is supple. Mallampati 1,  neck circumference:: 13.5 inches . Nasal airflow is patent.  Retrognathia is not  seen.  Dental status: intact  Cardiovascular:  Regular rate and  cardiac rhythm by pulse,  without distended neck veins. Respiratory: Lungs are clear to auscultation.  Skin:  Without evidence of ankle edema, or rash. Trunk: The patient's posture is erect.   Neurologic exam : The patient is awake and alert, oriented to place and time.   Memory subjective described as intact.  Attention span & concentration ability appears normal.  Speech is fluent,  without  dysarthria, dysphonia or aphasia.  Mood and affect are appropriate.   Cranial nerves: no loss of smell or taste reported  Pupils are equal and briskly reactive to light. Funduscopic exam deferred. Extraocular movements in vertical and horizontal planes were intact and without nystagmus. No Diplopia. Visual fields by finger perimetry are intact. Hearing was intact to soft voice and finger rubbing.    Facial sensation intact to fine touch.  Facial motor strength is symmetric and tongue and uvula move midline.  Neck ROM : rotation, tilt and flexion extension were normal for age and shoulder shrug was symmetrical.    Motor exam:  Symmetric bulk, tone and ROM.   Normal tone without cog wheeling, symmetric grip strength. Status post carpal tunnel surgery.    Sensory:  Fine touch, pinprick and vibration were tested  and  normal.  Proprioception tested in the upper extremities was normal.   Coordination: Rapid alternating movements in the fingers/hands were of normal speed.  The Finger-to-nose maneuver was intact without evidence of ataxia, dysmetria or tremor. Gait and station: Patient could rise unassisted from a seated position, walked without assistive device.  Stance is of normal width/ base and the patient turned with 3 steps.  Toe and heel walk were deferred.  Deep tendon reflexes: in the  upper and lower extremities are symmetric and intact.  Babinski response was deferred.       After spending a total time of 35 minutes face to face and additional time for physical and neurologic  examination, review of laboratory studies,  personal review of imaging studies, reports and results of other testing and review of referral information / records as far as provided in visit, I have established the following assessments:  1)  snoring is reported  but no apnea was witnessed  2)  had menopausal sleep changes, helped by Zoloft.  3)  Rare insomnia, she likes to go to bed late and sleeps well.    My Plan is to proceed with:  1) HST should suffice, her palpitations will be addressed by cardiology.  2) memory worries will be addressed on her RV, MOCA testing- if normal , consider neuropsychological.    I would like to thank [name], MD and [name], [place],   [id number] for allowing me to meet with and to take care of this pleasant patient.   In short, Renee [name] is presenting with snoring.    CC: PCP .  Electronically signed by: Larey Seat, MD 04/20/2020 10:07 AM  [facility] and [employer] certified by [employer] of Sleep Medicine and [place] of the [employer] of Sleep Medicine. Board certified In Neurology through the [place], Fellow of the [employer] of Neurology. Medical Director of [employer].

## 2020-04-20 NOTE — Patient Instructions (Signed)

## 2020-04-24 ENCOUNTER — Other Ambulatory Visit: Payer: Self-pay

## 2020-04-24 ENCOUNTER — Encounter: Payer: Self-pay | Admitting: Cardiology

## 2020-04-24 ENCOUNTER — Ambulatory Visit: Payer: Federal, State, Local not specified - PPO | Admitting: Cardiology

## 2020-04-24 VITALS — BP 116/72 | HR 62 | Ht 67.5 in | Wt 143.8 lb

## 2020-04-24 DIAGNOSIS — I341 Nonrheumatic mitral (valve) prolapse: Secondary | ICD-10-CM | POA: Diagnosis not present

## 2020-04-24 DIAGNOSIS — R002 Palpitations: Secondary | ICD-10-CM | POA: Diagnosis not present

## 2020-04-24 NOTE — Patient Instructions (Signed)

## 2020-04-27 ENCOUNTER — Telehealth: Payer: Self-pay

## 2020-04-27 NOTE — Telephone Encounter (Signed)
Left message with husband including call back number to schedule HST.

## 2020-05-04 ENCOUNTER — Other Ambulatory Visit: Payer: Self-pay | Admitting: Family Medicine

## 2020-05-12 ENCOUNTER — Other Ambulatory Visit: Payer: Self-pay

## 2020-05-12 ENCOUNTER — Ambulatory Visit (HOSPITAL_COMMUNITY): Payer: Federal, State, Local not specified - PPO | Attending: Internal Medicine

## 2020-05-12 DIAGNOSIS — R002 Palpitations: Secondary | ICD-10-CM

## 2020-05-12 DIAGNOSIS — I341 Nonrheumatic mitral (valve) prolapse: Secondary | ICD-10-CM

## 2020-05-12 LAB — ECHOCARDIOGRAM COMPLETE
Area-P 1/2: 3.35 cm2
S' Lateral: 1.9 cm

## 2020-05-17 ENCOUNTER — Ambulatory Visit (INDEPENDENT_AMBULATORY_CARE_PROVIDER_SITE_OTHER): Payer: Federal, State, Local not specified - PPO | Admitting: Neurology

## 2020-05-17 DIAGNOSIS — R002 Palpitations: Secondary | ICD-10-CM

## 2020-05-17 DIAGNOSIS — G4733 Obstructive sleep apnea (adult) (pediatric): Secondary | ICD-10-CM

## 2020-05-17 DIAGNOSIS — R0683 Snoring: Secondary | ICD-10-CM

## 2020-05-17 DIAGNOSIS — R413 Other amnesia: Secondary | ICD-10-CM

## 2020-06-05 ENCOUNTER — Encounter: Payer: Self-pay | Admitting: Internal Medicine

## 2020-06-05 ENCOUNTER — Ambulatory Visit: Payer: Federal, State, Local not specified - PPO | Admitting: Internal Medicine

## 2020-06-05 ENCOUNTER — Other Ambulatory Visit: Payer: Self-pay

## 2020-06-05 VITALS — BP 120/86 | HR 71 | Temp 98.8°F | Ht 67.5 in | Wt 145.0 lb

## 2020-06-05 DIAGNOSIS — Z0000 Encounter for general adult medical examination without abnormal findings: Secondary | ICD-10-CM | POA: Diagnosis not present

## 2020-06-05 DIAGNOSIS — K582 Mixed irritable bowel syndrome: Secondary | ICD-10-CM

## 2020-06-05 DIAGNOSIS — Z808 Family history of malignant neoplasm of other organs or systems: Secondary | ICD-10-CM

## 2020-06-05 DIAGNOSIS — M8580 Other specified disorders of bone density and structure, unspecified site: Secondary | ICD-10-CM

## 2020-06-05 DIAGNOSIS — F3341 Major depressive disorder, recurrent, in partial remission: Secondary | ICD-10-CM | POA: Diagnosis not present

## 2020-06-05 MED ORDER — PANTOPRAZOLE SODIUM 40 MG PO TBEC
40.0000 mg | DELAYED_RELEASE_TABLET | Freq: Every day | ORAL | 3 refills | Status: DC
Start: 2020-06-05 — End: 2021-05-17

## 2020-06-05 NOTE — Progress Notes (Signed)
Summary & Diagnosis:   This HST revealed a borderline mild apnea at AHI of 5.1/h. Mild  apnea begins with an AHI of 5.0/h.  There is a higher RDI, indicating the presence of snoring. The  highest AHI was recorded while in prone position. AHI was 8.9 and  RDI was 22/h.  No clinically relevant desaturation was noted, and heart rate  variability was in normal range with some trend to bradycardia.   Recommendations:    Based on the mild degree of apnea and more severe snoring, I  would recommend a dental device for mandibular advancement rather  than CPAP use. It will also benefit the patient to avoid prone  sleep.   Interpreting Physician: Larey Seat, MD

## 2020-06-05 NOTE — Procedures (Signed)
Sleep Study Report   Patient Information     First Name: Renee Last Name: [name] ID: [id number]  Birth Date: [birth year] Age: 59 Gender: Female  Referring Provider: Dr. Sharlet Salina, MD BMI: 21.7 (W=143 lb, H=5' 8'')  Neck Circ.:  14 '' Epworth:  12/24   Sleep Study Information    Study Date: 05/18/20 S/H/A Version: [id number] / 4.1.1528 / 79  History:    [name] is a right-handed Caucasian female with a medical history of Abnormal chest x-ray, Allergic rhinitis, cause unspecified, Allergy, Anxiety, Arthritis, Cataract, Depression, Depression, Dyslipidemia, colonic polyps (2008), IBS (irritable bowel syndrome), Infertility, female, Migraines, MVP (mitral valve prolapse), and Osteopenia. Sleep relevant medical history: Nocturia once at 5 AM, had a deviated nasal septum repair, GERD- nasal congestion. She snores and had episodes of sudden palpitations but endorsed green tea use in pm- her sleep is non-refreshing, non- restoring. ESS at 12/ 24 points.     Summary & Diagnosis:     This HST revealed a borderline mild apnea at AHI of 5.1/h. Mild apnea begins with an AHI of 5.0/h. There is a higher RDI, indicating the presence of snoring. The highest AHI was recorded while in prone position. AHI was 8.9 and RDI was 22/h.  No clinically relevant desaturation was noted, and heart rate variability was in normal range with some trend to bradycardia.   Recommendations:     Based on the mild degree of apnea and more severe snoring, I would recommend a dental device for mandibular advancement rather than CPAP use. It will also benefit the patient to avoid prone sleep.   Interpreting Physician: Larey Seat, MD          Sleep Summary  Oxygen Saturation Statistics   Start Study Time: End Study Time: Total Recording Time:          12:54:24 AM 9:51:12 AM   8 h, 56 min  Total Sleep Time % REM of Sleep Time:  8 h, 5 min  27.4    Mean: 95 Minimum: 79 Maximum: 99  Mean of Desaturations  Nadirs (%):   92  Oxygen Desaturation. %:   4-9 10-20 >20 Total  Events Number Total    13  1 92.9 7.1  0 0.0  14 100.0  Oxygen Saturation: <90 <=88 <85 <80 <70  Duration (minutes): Sleep % 0.1 0.0  0.0 0.0  0.0 0.0 0.0 0.0 0.0 0.0     Respiratory Indices      Total Events REM NREM All Night  pRDI: pAHI 3%: ODI 4%: pAHIc 3%: % CSR: pAHI 4%:  66  41  14  11 0.0 14 8.1 3.6 1.4 0.5 8.2 5.6 1.9 1.7 8.2 5.1 1.7 1.4 1.7       Pulse Rate Statistics during Sleep (BPM)      Mean: 56 Minimum: 43 Maximum: 93    Indices are calculated using technically valid sleep time of 8 h, 5 min.            5              15                    30         pAHI=5.1  Mild              Moderate                    Severe            Body Position Statistics  Position Supine Prone Right Left Non-Supine  Sleep (min) 204.3 27.0 62.0 192.5 281.5  Sleep % 42.1 5.6 12.8 39.6 57.9  pRDI 7.3 22.2 4.8 8.1 8.7  pAHI 3% 5.0 8.9 3.9 5.0 5.1  ODI 4% 1.8 0.0 3.9 1.3 1.7               Left   Prone  Right  Supine    Snoring Statistics

## 2020-06-05 NOTE — Patient Instructions (Addendum)
Think about getting the booster shot.  It is okay to go up on the sertraline to 150 mg daily.  Health Maintenance, Female Adopting a healthy lifestyle and getting preventive care are important in promoting health and wellness. Ask your health care provider about:  The right schedule for you to have regular tests and exams.  Things you can do on your own to prevent diseases and keep yourself healthy. What should I know about diet, weight, and exercise? Eat a healthy diet   Eat a diet that includes plenty of vegetables, fruits, low-fat dairy products, and lean protein.  Do not eat a lot of foods that are high in solid fats, added sugars, or sodium. Maintain a healthy weight Body mass index (BMI) is used to identify weight problems. It estimates body fat based on height and weight. Your health care provider can help determine your BMI and help you achieve or maintain a healthy weight. Get regular exercise Get regular exercise. This is one of the most important things you can do for your health. Most adults should:  Exercise for at least 150 minutes each week. The exercise should increase your heart rate and make you sweat (moderate-intensity exercise).  Do strengthening exercises at least twice a week. This is in addition to the moderate-intensity exercise.  Spend less time sitting. Even light physical activity can be beneficial. Watch cholesterol and blood lipids Have your blood tested for lipids and cholesterol at 59 years of age, then have this test every 5 years. Have your cholesterol levels checked more often if:  Your lipid or cholesterol levels are high.  You are older than 59 years of age.  You are at high risk for heart disease. What should I know about cancer screening? Depending on your health history and family history, you may need to have cancer screening at various ages. This may include screening for:  Breast cancer.  Cervical cancer.  Colorectal  cancer.  Skin cancer.  Lung cancer. What should I know about heart disease, diabetes, and high blood pressure? Blood pressure and heart disease  High blood pressure causes heart disease and increases the risk of stroke. This is more likely to develop in people who have high blood pressure readings, are of African descent, or are overweight.  Have your blood pressure checked: ? Every 3-5 years if you are 43-76 years of age. ? Every year if you are 27 years old or older. Diabetes Have regular diabetes screenings. This checks your fasting blood sugar level. Have the screening done:  Once every three years after age 61 if you are at a normal weight and have a low risk for diabetes.  More often and at a younger age if you are overweight or have a high risk for diabetes. What should I know about preventing infection? Hepatitis B If you have a higher risk for hepatitis B, you should be screened for this virus. Talk with your health care provider to find out if you are at risk for hepatitis B infection. Hepatitis C Testing is recommended for:  Everyone born from [birth year] through 1965.  Anyone with known risk factors for hepatitis C. Sexually transmitted infections (STIs)  Get screened for STIs, including gonorrhea and chlamydia, if: ? You are sexually active and are younger than 59 years of age. ? You are older than 59 years of age and your health care provider tells you that you are at risk for this type of infection. ? Your sexual activity has changed  since you were last screened, and you are at increased risk for chlamydia or gonorrhea. Ask your health care provider if you are at risk.  Ask your health care provider about whether you are at high risk for HIV. Your health care provider may recommend a prescription medicine to help prevent HIV infection. If you choose to take medicine to prevent HIV, you should first get tested for HIV. You should then be tested every 3 months for as long as  you are taking the medicine. Pregnancy  If you are about to stop having your period (premenopausal) and you may become pregnant, seek counseling before you get pregnant.  Take 400 to 800 micrograms (mcg) of folic acid every day if you become pregnant.  Ask for birth control (contraception) if you want to prevent pregnancy. Osteoporosis and menopause Osteoporosis is a disease in which the bones lose minerals and strength with aging. This can result in bone fractures. If you are 17 years old or older, or if you are at risk for osteoporosis and fractures, ask your health care provider if you should:  Be screened for bone loss.  Take a calcium or vitamin D supplement to lower your risk of fractures.  Be given hormone replacement therapy (HRT) to treat symptoms of menopause. Follow these instructions at home: Lifestyle  Do not use any products that contain nicotine or tobacco, such as cigarettes, e-cigarettes, and chewing tobacco. If you need help quitting, ask your health care provider.  Do not use street drugs.  Do not share needles.  Ask your health care provider for help if you need support or information about quitting drugs. Alcohol use  Do not drink alcohol if: ? Your health care provider tells you not to drink. ? You are pregnant, may be pregnant, or are planning to become pregnant.  If you drink alcohol: ? Limit how much you use to 0-1 drink a day. ? Limit intake if you are breastfeeding.  Be aware of how much alcohol is in your drink. In the [place] one drink equals one 12 oz bottle of beer (355 mL), one 5 oz glass of wine (148 mL), or one 1 oz glass of hard liquor (44 mL). General instructions  Schedule regular health, dental, and eye exams.  Stay current with your vaccines.  Tell your health care provider if: ? You often feel depressed. ? You have ever been abused or do not feel safe at home. Summary  Adopting a healthy lifestyle and getting preventive care are  important in promoting health and wellness.  Follow your health care provider's instructions about healthy diet, exercising, and getting tested or screened for diseases.  Follow your health care provider's instructions on monitoring your cholesterol and blood pressure. This information is not intended to replace advice given to you by your health care provider. Make sure you discuss any questions you have with your health care provider. Document Revised: 06/10/2018 Document Reviewed: 06/10/2018 Elsevier Patient Education  2020 [employer].

## 2020-06-05 NOTE — Progress Notes (Signed)
   Subjective:   Patient ID: Renee [name], female    DOB: Jan 18, 1961, 59 y.o.   MRN: [id number]  HPI The patient is a 59 YO female coming in for physical.  PMH, [place], social history reviewed and updated  Review of Systems  Constitutional: Negative.   HENT: Negative.   Eyes: Negative.   Respiratory: Negative for cough, chest tightness and shortness of breath.   Cardiovascular: Negative for chest pain, palpitations and leg swelling.  Gastrointestinal: Negative for abdominal distention, abdominal pain, constipation, diarrhea, nausea and vomiting.  Musculoskeletal: Negative.   Skin: Negative.   Neurological: Negative.   Psychiatric/Behavioral: Negative.     Objective:  Physical Exam Constitutional:      Appearance: She is well-developed.  HENT:     Head: Normocephalic and atraumatic.  Cardiovascular:     Rate and Rhythm: Normal rate and regular rhythm.  Pulmonary:     Effort: Pulmonary effort is normal. No respiratory distress.     Breath sounds: Normal breath sounds. No wheezing or rales.  Abdominal:     General: Bowel sounds are normal. There is no distension.     Palpations: Abdomen is soft.     Tenderness: There is no abdominal tenderness. There is no rebound.  Musculoskeletal:     Cervical back: Normal range of motion.  Skin:    General: Skin is warm and dry.  Neurological:     Mental Status: She is alert and oriented to person, place, and time.     Coordination: Coordination normal.     Vitals:   06/05/20 1311  BP: 120/86  Pulse: 71  Temp: 98.8 F (37.1 C)  TempSrc: Oral  SpO2: 97%  Weight: 145 lb (65.8 kg)  Height: 5' 7.5" (1.715 m)    This visit occurred during the SARS-CoV-2 public health emergency.  Safety protocols were in place, including screening questions prior to the visit, additional usage of staff PPE, and extensive cleaning of exam room while observing appropriate contact time as indicated for disinfecting solutions.   Assessment & Plan:

## 2020-06-06 ENCOUNTER — Telehealth: Payer: Self-pay | Admitting: Neurology

## 2020-06-06 NOTE — Assessment & Plan Note (Signed)
Checking TSH, no symptoms.

## 2020-06-06 NOTE — Assessment & Plan Note (Signed)
Stable and overall decent control with diet and otc products.

## 2020-06-06 NOTE — Assessment & Plan Note (Signed)
Flu shot declines. Covid-19 2 shots advised booster did not bring card. Shingrix counseled. Tetanus up to date. Colonoscopy up to date. Mammogram up to date with gyn, pap smear up to date with gyn and dexa up to date with gyn. Counseled about sun safety and mole surveillance. Counseled about the dangers of distracted driving. Given 10 year screening recommendations.

## 2020-06-06 NOTE — Assessment & Plan Note (Signed)
Counseled about need for exercise, calcium and vitamin D.

## 2020-06-06 NOTE — Telephone Encounter (Signed)
Called patient to discuss sleep study results. No answer at this time. LVM for the patient to call back.   

## 2020-06-06 NOTE — Telephone Encounter (Signed)
-----   Message from Larey Seat, MD sent at 06/05/2020  4:15 PM EST ----- Summary & Diagnosis:   This HST revealed a borderline mild apnea at AHI of 5.1/h. Mild  apnea begins with an AHI of 5.0/h.  There is a higher RDI, indicating the presence of snoring. The  highest AHI was recorded while in prone position. AHI was 8.9 and  RDI was 22/h.  No clinically relevant desaturation was noted, and heart rate  variability was in normal range with some trend to bradycardia.   Recommendations:    Based on the mild degree of apnea and more severe snoring, I  would recommend a dental device for mandibular advancement rather  than CPAP use. It will also benefit the patient to avoid prone  sleep.   Interpreting Physician: Larey Seat, MD

## 2020-06-06 NOTE — Assessment & Plan Note (Signed)
Will increase zoloft to 150 mg daily and if effective change sig for rx.

## 2020-06-07 NOTE — Telephone Encounter (Signed)
Called the pt 2nd time to review study. Was able to discuss the overall minimal sleep apnea that was present on her hst. Advised the patient on the recommendations per Dr Brett Fairy. Pt verbalized understanding. At this time patient wanted to schedule a follow up visit to discuss results but also address memory concerns as was previously discussed at first visit. I have scheduled the patient 07/26/19 at 3:30 pm with check in of 3. Pt had no questions at this time but was encouraged to call back if questions arise.

## 2020-06-12 ENCOUNTER — Encounter: Payer: Self-pay | Admitting: Family Medicine

## 2020-06-12 ENCOUNTER — Ambulatory Visit: Payer: Federal, State, Local not specified - PPO | Admitting: Family Medicine

## 2020-06-12 ENCOUNTER — Other Ambulatory Visit: Payer: Self-pay

## 2020-06-12 ENCOUNTER — Ambulatory Visit (INDEPENDENT_AMBULATORY_CARE_PROVIDER_SITE_OTHER): Payer: Federal, State, Local not specified - PPO

## 2020-06-12 ENCOUNTER — Ambulatory Visit: Payer: Self-pay

## 2020-06-12 VITALS — BP 132/82 | HR 78 | Ht 67.5 in | Wt 146.2 lb

## 2020-06-12 DIAGNOSIS — M75111 Incomplete rotator cuff tear or rupture of right shoulder, not specified as traumatic: Secondary | ICD-10-CM

## 2020-06-12 DIAGNOSIS — G8929 Other chronic pain: Secondary | ICD-10-CM | POA: Diagnosis not present

## 2020-06-12 DIAGNOSIS — M25511 Pain in right shoulder: Secondary | ICD-10-CM

## 2020-06-12 NOTE — Patient Instructions (Addendum)
Good to see you again!  Continue home exercises x2 week  Please get an xray on the way out.  See me again in 2 months if not perfect.

## 2020-06-12 NOTE — Progress Notes (Signed)
[place] Phone: [phone] Subjective:   Renee [name], LAT, ATC acting as a scribe for Charlann Boxer, DO  I'm seeing this patient by the request  of:  [name], MD  CC: R shoulder pain  DTO:[id number]   04/11/2020 Injected today.  Has been doing relatively well but I do think that this will be helpful.  We discussed icing regimen and home exercises.  Patient will do more the home exercises after doing all the physical therapy.  Patient is not completely better in about 1 to 2 months I do think we should consider the MRI.  Patient is in agreement and will have a follow-up scheduled in 2 months as well   Update 06/12/2020 Renee [name] is a 59 y.o. female coming in with complaint of right shoulder pain. Pt reports getting much relief from last injection. Pt feels like she has been overdoing it lately w/ the holidays and c/o anterior shoulder pain. No UE numbness/tingling. Increased pain w/ shoulder ext. Pt had an unrelated question: 11 months ago pt suffered from shingles outbreak. Pt reports slightly injuring fingers (shutting in a drawer), however the pain felt like the shingles pain. "Can you experience shingles like pain, when under a lot of stress?"    Past Medical History:  Diagnosis Date  . Abnormal chest x-ray    CXR c/w COPD. Full PFTs '11 - normal  . Allergy    seasonal  . Anxiety   . Arthritis    both knees, post traumatic - neck  . Cataract    removed bilateral  . Depression    with anxiety.  . Dyslipidemia   . Hx of colonic polyps 2008  . IBS (irritable bowel syndrome)   . 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 Migraines    during teenage years  . MVP (mitral valve prolapse)    last 2D echo approx '10  . Osteopenia    DEXA 03/2012: -1.0   Past Surgical History:  Procedure Laterality Date  . APPENDECTOMY    . BUNIONECTOMY WITH HAMMERTOE  RECONSTRUCTION Left 07/06/13   [place]  . CARDIAC CATHETERIZATION  2006   normal study  . CARPAL TUNNEL RELEASE  2011   right hand  . HYSTEROPLASTY REPAIR OF UTERINE ANOMALY     2003 - laproscopically; 2006 - laparotomy  . LAPAROSCOPIC ENDOMETRIOSIS FULGURATION     1996 and 2002  . MANDIBLE RECONSTRUCTION    . NASAL SEPTUM SURGERY  11/2012   [name]  . TRIGGER FINGER RELEASE  2011   R thimb and ring finger  . TRIGGER FINGER RELEASE  02/04/2012   [place], in office   Social History   Socioeconomic History  . Marital status: Married    Spouse name: Not on file  . Number of children: 0  . Years of education: 37  . Highest education level: Not on file  Occupational History  . 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 Smoking status: Never Smoker  . Smokeless tobacco: Never Used  Substance and Sexual Activity  . Alcohol use: Yes    Comment: social  . Drug use: No  . Sexual activity: Yes    Partners: Male  Other Topics Concern  . Not on file  Social History Narrative   Graduated from [employer] with degree in Food science/nutrition.   Married '94.  Denies any physical or sexual abuse.   Family in : parents and sister.   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   Has smoke alarms, wears seatbelt at all times, uses helmut when appropriate, firearms present in the home, uses herbal remedies, caffeine- 2-3 per day.   No regular exercise.   Social Determinants of Health   Financial Resource Strain: Not on file  Food Insecurity: Not on file  Transportation Needs: Not on file  Physical Activity: Not on file  Stress: Not on file  Social Connections: Not on file   Allergies  Allergen Reactions  . Codeine Other (See Comments)    Hot flashes/passed out  . Pollen Extract Other (See Comments)    drainage   Family History  Problem Relation Age of Onset  . Arthritis Mother        DOB 54  . Cancer Mother        Thyroid cancer   . Hypertension Mother   . Atrial fibrillation Mother   . Melanoma Mother   . Breast cancer Mother   . Arthritis Father        DOB 1929  . Melanoma Father   . Colon polyps Neg Hx   . Rectal cancer Neg Hx   . Stomach cancer Neg Hx       Current Outpatient Medications (Respiratory):  .  cetirizine (ZYRTEC) 10 MG tablet, Take 10 mg by mouth daily. .  fluticasone (FLONASE) 50 MCG/ACT nasal spray, Place 2 sprays into both nostrils daily.  Current Outpatient Medications (Analgesics):  .  aspirin EC 81 MG tablet, Take 81 mg by mouth daily. Swallow whole.   Current Outpatient Medications (Other):  Marland Kitchen  DIGESTIVE ENZYMES PO, Take 1 tablet by mouth every other day. .  Multiple Vitamin (MULTIVITAMIN WITH MINERALS) TABS tablet, Take 1 tablet by mouth daily. .  pantoprazole (PROTONIX) 40 MG tablet, Take 1 tablet (40 mg total) by mouth daily. .  sertraline (ZOLOFT) 50 MG tablet, TAKE 2 TABLETS BY MOUTH DAILY. .  simethicone (MYLICON) 80 MG chewable tablet, Chew 80 mg by mouth every 6 (six) hours as needed for flatulence. .  Vitamin D, Ergocalciferol, (DRISDOL) 1.25 MG (50000 UNIT) CAPS capsule, TAKE 1 CAPSULE BY MOUTH EVERY 7 DAYS   Reviewed prior external information including notes and imaging from  primary care provider As well as notes that were available from care everywhere and other healthcare systems.  Past medical history, social, surgical and family history all reviewed in electronic medical record.  No pertanent information unless stated regarding to the chief complaint.   Review of Systems:  No headache, visual changes, nausea, vomiting, diarrhea, constipation, dizziness, abdominal pain, skin rash, fevers, chills, night sweats, weight loss, swollen lymph nodes, body aches, joint swelling, chest pain, shortness of breath, mood changes. POSITIVE muscle aches  Objective  Blood pressure 132/82, pulse 78, height 5' 7.5" (1.715 m), weight 146 lb 3.2 oz (66.3 kg), SpO2 98 %.    General: No apparent distress alert and oriented x3 mood and affect normal, dressed appropriately.  HEENT: Pupils equal, extraocular movements intact  Respiratory: Patient's speak in full sentences and does not appear short of breath  Cardiovascular: No lower extremity edema, non tender, no erythema  Neuro: Cranial nerves II through XII are intact, neurovascularly intact in all extremities with 2+ DTRs and 2+ pulses.  Gait normal with good balance and coordination.  MSK: Patient does have what appears to be positive impingement noted of the right  shoulder still with [place].  Rotator cuff strength though seems to be 4+ out of 5.  This is an improvement.  Mild positive bicep sign with Jurgenson's.  Contralateral shoulder unremarkable.  Limited musculoskeletal ultrasound was performed and interpreted by Lyndal Pulley  Limited ultrasound of patient's shoulder shows that patient does have still some partial tearing and no tears of the supraspinatus tendon near the insertion.  No significant retraction are noted.  Mild reactive hypoechoic changes that is consistent with bursitis.  Overall though significant decrease in inflammation of the bicep tendon sheath that was seen previously.    Impression and Recommendations:    The above documentation has been reviewed and is accurate and complete Lyndal Pulley, DO

## 2020-06-12 NOTE — Assessment & Plan Note (Signed)
Patient still has signs and symptoms consistent with some of the partial tear of the rotator cuff.  We discussed with patient in great length.  Patient is 90% better though after the last injection.  We did discuss the possibility of advanced imaging.  Patient though wants to continue with conservative therapy.  Patient knows if any worsening pain, loss of range of motion or weakness that I would like the advanced imaging.  Patient is in agreement with the plan and will follow up with me again 2 months

## 2020-07-20 ENCOUNTER — Other Ambulatory Visit: Payer: Self-pay | Admitting: Family Medicine

## 2020-07-25 ENCOUNTER — Encounter: Payer: Self-pay | Admitting: Neurology

## 2020-07-25 ENCOUNTER — Ambulatory Visit: Payer: Federal, State, Local not specified - PPO | Admitting: Neurology

## 2020-07-25 ENCOUNTER — Other Ambulatory Visit: Payer: Self-pay

## 2020-07-25 VITALS — BP 117/74 | HR 74 | Ht 67.5 in | Wt 145.0 lb

## 2020-07-25 DIAGNOSIS — Z818 Family history of other mental and behavioral disorders: Secondary | ICD-10-CM

## 2020-07-25 DIAGNOSIS — F418 Other specified anxiety disorders: Secondary | ICD-10-CM

## 2020-07-25 DIAGNOSIS — R0683 Snoring: Secondary | ICD-10-CM | POA: Diagnosis not present

## 2020-07-25 NOTE — Patient Instructions (Signed)

## 2020-07-25 NOTE — Progress Notes (Signed)
SLEEP MEDICINE CLINIC    Provider:  Larey Seat, MD  Primary Care Physician:  [name], MD [place] [address]     Referring Provider: [name], [place],  [place] [id number]          Chief Complaint according to patient   Patient presents with:       Pt alone, rm 11. Presents today following up post Sleep study results and to address memory concerns. Pt states things remain about the same. She is easily distracted.  Would be able to focus on multiple things. She use to have quick responses. She doesn't feel as sharp as she used to. Her mother and maternal grandmother diagnosed with dementia and she is wanting to assess. We did a MOCA today to address this concern. CD   HISTORY OF PRESENT ILLNESS:  Renee [name] is a 60 year-old Caucasian female patient and is seen here in a RV on 07/25/2020 ; I have the pleasure of meeting again with Renee [name] today who underwent a home sleep test which revealed very mild apnea at an AHI of 5.1 and a higher RDI indicating the presence of louder snoring.  The RDI was 22/h.  There was no clinically relevant oxygen desaturation noted and her heart rate variability was in normal range.  The patient could use as I had said a mandibular advancement device but she is also an misaligned user.  At this time there may be a conflict between using the realignment therapy and yet applying pressure to the lower jaw to move forward.  I would encourage her to talk to her dentist or orthodontist about this there is certainly the alternative option of CPAP.  CPAP will treat snoring just as effectively.  And with an AHI of 5 or above I have the option of ordering a CPAP device.  I would like to add that the patient has a low body mass index low blood pressure and no anatomical risk factors otherwise that would hint at being a risk patient for obstructive sleep apnea.  .   Chief concern according to  patient :  [name] reports that she has been snoring morning more loudly as has been witnessed by her husband.  She is not feeling as well rested and as restored in the morning either.  In addition she has noted heart palpitations and associated shortness of breath which occurs in daytime without warning and is not related to physical exertion.  She describes an example where she was shopping with another family member and had an episode of palpitations while looking at some clothes.  There was no orthostatic change and she was not standing in one place for long time.  She was neither exposed to extreme heat or felt dehydrated at the time.  Some of these palpitations have already occurred in her 30s and 3s.  She is physically active and participates in Pilates and in August and September had regular physical therapy for a rotator cuff injury of the right shoulder.   I have the pleasure of seeing Renee [name] today, a right-handed White or Caucasian female with a possible sleep disorder.  She  has a past medical history of Abnormal chest x-ray, Allergy, Anxiety, Arthritis, Cataract, Depression, Dyslipidemia, colonic polyps (2008), IBS (irritable bowel syndrome), Infertility, female, Migraines, MVP (mitral valve prolapse), and Osteopenia.    Sleep relevant medical history: Nocturia once at 5 AM, deviated  septum repair.GERD- nasal congestion  Family medical /sleep history: No other family member on CPAP with OSA.mother is 28 and has memory loss, hearing loss.  Social history: Patient is retired from [employer] in [place]-  and lives in a household with spouse , watches her grandchildren in daytime. Stepdaughter with kids moved back in.   Family status is married -  with children and grandchildren.  Several family members had Hayes over the Christmas holidays 2021, and they kept separate.  Pets are present. Tobacco use; none .  ETOH use; 1-2 / week , Caffeine intake in form of Coffee( 2 cups in AM ) Soda(  rarely) Tea ( green tea in Pm, 3-4 / week) or energy drinks. Regular exercise in form of pilates.  Hobbies: gardening.      Sleep habits are as follows: The patient's dinner time is between 5-7 PM.  The patient goes to bed at 12 PM and continues to sleep for 7-8 hours, wakes for one bathroom breaks, the first time at 4-5 AM.  The preferred sleep position is on her left side ( right shoulder was not possible), with the support of 1 pillows. Dreams are reportedly frequent. 9 AM is the usual rise time.The patient wakes up spontaneously.  Snoring witnessed between 4-6  AM. She reports not feeling refreshed or restored in AM, with symptoms such as dry mouth and some residual fatigue. Naps are taken rarely.    Review of Systems: Out of a complete 14 system review, the patient complains of only the following symptoms, and all other reviewed systems are negative.:  Fatigue, sleepiness , snoring, She also feels she needs longer to find the right words, attention is easily diverted.    How likely are you to doze in the following situations: 0 = not likely, 1 = slight chance, 2 = moderate chance, 3 = high chance   Sitting and Reading? Watching Television? Sitting inactive in a public place (theater or meeting)? As a passenger in a car for an hour without a break? Lying down in the afternoon when circumstances permit? Sitting and talking to someone? Sitting quietly after lunch without alcohol? In a car, while stopped for a few minutes in traffic?   Total =9/ 24 points   FSS endorsed at 29 / 63 points.   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   Married '94.   Denies any physical or sexual abuse.   Family in [place]: parents and sister.   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   Has smoke alarms, wears seatbelt at all times, uses helmut when appropriate, firearms present in the home, uses herbal remedies, caffeine- 2-3 per day.   No regular exercise.   Social Determinants of Health   Financial Resource Strain: Not on file  Food Insecurity: Not  on file  Transportation Needs: Not on file  Physical Activity: Not on file  Stress: Not on file  Social Connections: Not on file    Family History  Problem Relation Age of Onset   Arthritis Mother        DOB 9   Cancer Mother        Thyroid cancer   Hypertension Mother    Atrial fibrillation Mother    Melanoma Mother    Breast cancer Mother    Arthritis Father        DOB 83   Melanoma Father    Colon polyps Neg Hx    Rectal cancer Neg Hx    Stomach cancer Neg Hx     Past Medical History:  Diagnosis Date   Abnormal chest x-ray    CXR c/w COPD. Full PFTs '11 - normal   Allergy    seasonal   Anxiety    Arthritis    both knees, post traumatic - neck   Cataract    removed bilateral   Depression    with anxiety.   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   Migraines    during teenage years   MVP (mitral valve prolapse)    last 2D echo approx '10   Osteopenia    DEXA 03/2012: -1.0    Past Surgical History:  Procedure Laterality Date   APPENDECTOMY     BUNIONECTOMY WITH HAMMERTOE RECONSTRUCTION Left 07/06/13   [facility]   CARDIAC CATHETERIZATION  2006   normal study   CARPAL TUNNEL RELEASE  2011    right hand   HYSTEROPLASTY REPAIR OF UTERINE ANOMALY     2003 - laproscopically; 2006 - laparotomy   LAPAROSCOPIC ENDOMETRIOSIS FULGURATION     1996 and 2002   MANDIBLE RECONSTRUCTION     NASAL SEPTUM SURGERY  11/2012   [facility] FINGER RELEASE  2011   R thimb and ring finger   TRIGGER FINGER RELEASE  02/04/2012   [facility], in office     Current Outpatient Medications on File Prior to Visit  Medication Sig Dispense Refill   cetirizine (ZYRTEC) 10 MG tablet Take 10 mg by mouth daily.     DIGESTIVE ENZYMES PO Take 1 tablet by mouth every other day.     fluticasone (FLONASE) 50 MCG/ACT nasal spray Place 2 sprays into both nostrils daily. 48 g 3   Multiple Vitamin (MULTIVITAMIN WITH MINERALS) TABS tablet Take 1 tablet by mouth daily.     pantoprazole (PROTONIX) 40 MG tablet Take 1 tablet (40 mg total) by mouth daily. 90 tablet 3   sertraline (ZOLOFT) 50 MG tablet TAKE 2 TABLETS BY MOUTH DAILY. 180 tablet 1   simethicone (MYLICON) 80 MG chewable tablet Chew 80 mg by mouth every 6 (six) hours as needed for flatulence.     Vitamin D, Ergocalciferol, (DRISDOL) 1.25 MG (50000 UNIT) CAPS capsule TAKE 1 CAPSULE BY MOUTH EVERY 7 DAYS 12 capsule 0   No current facility-administered medications on file prior to visit.    Allergies  Allergen Reactions   Codeine Other (See Comments)    Hot flashes/passed out   Pollen Extract Other (See Comments)    drainage    Physical exam:  Today's Vitals   07/25/20 1533  BP: 117/74  Pulse: 74  Weight: 145 lb (65.8 kg)  Height:  5' 7.5" (1.715 m)   Body mass index is 22.38 kg/m.   Wt Readings from Last 3 Encounters:  07/25/20 145 lb (65.8 kg)  06/12/20 146 lb 3.2 oz (66.3 kg)  06/05/20 145 lb (65.8 kg)     Ht Readings from Last 3 Encounters:  07/25/20 5' 7.5" (1.715 m)  06/12/20 5' 7.5" (1.715 m)  06/05/20 5' 7.5" (1.715 m)      General: The patient is awake, alert and appears not in acute distress. The patient is well  groomed. Head: Normocephalic, atraumatic. Neck is supple. Mallampati 1,  neck circumference:: 13.5 inches . Nasal airflow is patent.  Retrognathia is not  seen.  Dental status: intact  Cardiovascular:  Regular rate and cardiac rhythm by pulse,  without distended neck veins. Respiratory: Lungs are clear to auscultation.  Skin:  Without evidence of ankle edema, or rash. Trunk: The patient's posture is erect.   Neurologic exam : The patient is awake and alert, oriented to place and time.   Memory subjective described as intact.  Attention span & concentration ability appears normal.  Speech is fluent,  without  dysarthria, dysphonia or aphasia.  Mood and affect are appropriate.   Cranial nerves: no loss of smell or taste reported   Montreal Cognitive Assessment  07/25/2020  Visuospatial/ Executive (0/5) 5  Naming (0/3) 3  Attention: Read list of digits (0/2) 2  Attention: Read list of letters (0/1) 1  Attention: Serial 7 subtraction starting at 100 (0/3) 3  Language: Repeat phrase (0/2) 2  Language : Fluency (0/1) 1  Abstraction (0/2) 2  Delayed Recall (0/5) 4  Orientation (0/6) 6  Total 29       After spending a total time of 15 minutes face to face and additional time for physical and neurologic examination, review of laboratory studies,  personal review of imaging studies, reports and results of other testing and review of referral information / records as far as provided in visit, I have established the following assessments: Mild OSA with high RDI- short, Azjah Pardo is presenting with snoring.     My Plan is to proceed with: CPAP autotitration with a setting from 5-10 cm water, 2 cm EPR , mask of choice.   Consider chin strap and nasal pillow or nasal cradle.   She will discuss with her orthodontist if a mandibular advancement therapy is possible while being an invisaline patient.   I would like to thank [name], [place],  [place]  [id number] for allowing me to meet with and to take care of this pleasant patient.    CC: PCP .  Electronically signed by: Larey Seat, MD 07/25/2020 3:52 PM  [facility] Board certified by The [employer] of Sleep Medicine and Diplomate of the [employer] of Sleep Medicine. Board certified In Neurology through the [place], Fellow of the [employer] of Neurology. Medical Director of [facility].

## 2020-09-05 ENCOUNTER — Telehealth: Payer: Self-pay | Admitting: Cardiology

## 2020-09-05 NOTE — Telephone Encounter (Signed)
Renee [name] is calling stating she received a call from Renee [name] today that stated she was calling in regards to her upcoming appointment. I was unable to find documentation in regards to this call, and reached out to [employer] confirming it was not her. I also tried to contact Renee [name], but was unable to get an answer. Renee [name] states she was unsure of the reason for the call due to just scheduling her appointment yesterday. Please advise.

## 2020-09-06 NOTE — Telephone Encounter (Signed)
Spoke with pt, aware not sure who called or why.

## 2020-09-08 DIAGNOSIS — L710 Perioral dermatitis: Secondary | ICD-10-CM | POA: Diagnosis not present

## 2020-09-19 DIAGNOSIS — H52203 Unspecified astigmatism, bilateral: Secondary | ICD-10-CM | POA: Diagnosis not present

## 2020-09-19 DIAGNOSIS — H1789 Other corneal scars and opacities: Secondary | ICD-10-CM | POA: Diagnosis not present

## 2020-09-19 DIAGNOSIS — Z961 Presence of intraocular lens: Secondary | ICD-10-CM | POA: Diagnosis not present

## 2020-10-05 ENCOUNTER — Other Ambulatory Visit: Payer: Self-pay | Admitting: Internal Medicine

## 2020-10-20 DIAGNOSIS — L710 Perioral dermatitis: Secondary | ICD-10-CM | POA: Diagnosis not present

## 2020-11-08 NOTE — Progress Notes (Signed)
HPI: FU palpitations.  Laboratories September 2021 showed hemoglobin 13.6, potassium 4.6 and normal renal function.  Echocardiogram November 2021 showed normal LV function, trace mitral regurgitation.  Since last seen she denies dyspnea, chest pain or syncope.  Her palpitations have improved.  Current Outpatient Medications  Medication Sig Dispense Refill  . cetirizine (ZYRTEC) 10 MG tablet Take 10 mg by mouth daily.    Marland Kitchen DIGESTIVE ENZYMES PO Take 1 tablet by mouth every other day.    Marland Kitchen doxycycline (MONODOX) 100 MG capsule Take 100 mg by mouth 2 (two) times daily.    . fluticasone (FLONASE) 50 MCG/ACT nasal spray Place 2 sprays into both nostrils daily. 48 g 3  . metroNIDAZOLE (METROCREAM) 0.75 % cream Apply 1 application topically 2 (two) times daily.    . Multiple Vitamin (MULTIVITAMIN WITH MINERALS) TABS tablet Take 1 tablet by mouth daily.    . pantoprazole (PROTONIX) 40 MG tablet Take 1 tablet (40 mg total) by mouth daily. (Patient taking differently: Take 40 mg by mouth as needed.) 90 tablet 3  . sertraline (ZOLOFT) 50 MG tablet TAKE 2 TABLETS BY MOUTH DAILY. 180 tablet 1  . simethicone (MYLICON) 80 MG chewable tablet Chew 80 mg by mouth every 6 (six) hours as needed for flatulence.    . tacrolimus (PROTOPIC) 0.1 % ointment Apply topically daily.    . Vitamin D, Ergocalciferol, (DRISDOL) 1.25 MG (50000 UNIT) CAPS capsule TAKE 1 CAPSULE BY MOUTH EVERY 7 DAYS 12 capsule 0   No current facility-administered medications for this visit.     Past Medical History:  Diagnosis Date  . Abnormal chest x-ray    CXR c/w COPD. Full PFTs '11 - normal  . Allergy    seasonal  . Anxiety   . Arthritis    both knees, post traumatic - neck  . Cataract    removed bilateral  . Depression    with anxiety.  . Dyslipidemia   . Hx of colonic polyps 2008  . IBS (irritable bowel syndrome)   . 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 Migraines    during teenage  years  . MVP (mitral valve prolapse)    last 2D echo approx '10  . Osteopenia    DEXA 03/2012: -1.0    Past Surgical History:  Procedure Laterality Date  . APPENDECTOMY    . BUNIONECTOMY WITH HAMMERTOE RECONSTRUCTION Left 07/06/13   [facility]  . CARDIAC CATHETERIZATION  2006   normal study  . CARPAL TUNNEL RELEASE  2011   right hand  . HYSTEROPLASTY REPAIR OF UTERINE ANOMALY     2003 - laproscopically; 2006 - laparotomy  . LAPAROSCOPIC ENDOMETRIOSIS FULGURATION     1996 and 2002  . MANDIBLE RECONSTRUCTION    . NASAL SEPTUM SURGERY  11/2012   [name]  . TRIGGER FINGER RELEASE  2011   R thimb and ring finger  . TRIGGER FINGER RELEASE  02/04/2012   [facility], in office    Social History   Socioeconomic History  . Marital status: Married    Spouse name: Not on file  . Number of children: 0  . Years of education: 54  . Highest education level: Not on file  Occupational History  . 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 Smoking status: Never Smoker  . Smokeless tobacco: Never Used  Substance and Sexual Activity  . Alcohol use: Yes  Comment: social  . Drug use: No  . Sexual activity: Yes    Partners: Male  Other Topics Concern  . Not on file  Social History Narrative   Graduated from [employer] with degree in Food science/nutrition.   Married '94.   Denies any physical or sexual abuse.   Family in [place]: parents and sister.   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   Has smoke alarms, wears seatbelt at all times, uses helmut when appropriate, firearms present in the home, uses herbal remedies, caffeine- 2-3 per day.   No regular exercise.   Social Determinants of Health   Financial Resource Strain: Not on file  Food Insecurity: Not on file  Transportation Needs: Not on file  Physical Activity: Not on file  Stress: Not on file  Social Connections: Not on file  Intimate Partner Violence: Not on file    Family History   Problem Relation Age of Onset  . Arthritis Mother        DOB 25  . Cancer Mother        Thyroid cancer  . Hypertension Mother   . Atrial fibrillation Mother   . Melanoma Mother   . Breast cancer Mother   . Arthritis Father        DOB 1929  . Melanoma Father   . Colon polyps Neg Hx   . Rectal cancer Neg Hx   . Stomach cancer Neg Hx     ROS: no fevers or chills, productive cough, hemoptysis, dysphasia, odynophagia, melena, hematochezia, dysuria, hematuria, rash, seizure activity, orthopnea, PND, pedal edema, claudication. Remaining systems are negative.  Physical Exam: Well-developed well-nourished in no acute distress.  Skin is warm and dry.  HEENT is normal.  Neck is supple.  Chest is clear to auscultation with normal expansion.  Cardiovascular exam is regular rate and rhythm.  Abdominal exam nontender or distended. No masses palpated. Extremities show no edema. neuro grossly intact  A/P  1 palpitations-felt possibly secondary to PACs or PVCs.  LV function is normal.  She also has a cardiomobile device.  We can consider addition of beta-blockade in the future as needed.  2 history of mitral valve prolapse-not evident on most recent echocardiogram.  3 hyperlipidemia-followed by primary care.  Kirk Ruths, MD

## 2020-11-17 ENCOUNTER — Other Ambulatory Visit: Payer: Self-pay

## 2020-11-17 ENCOUNTER — Ambulatory Visit: Payer: Federal, State, Local not specified - PPO | Admitting: Cardiology

## 2020-11-17 ENCOUNTER — Encounter: Payer: Self-pay | Admitting: Cardiology

## 2020-11-17 VITALS — BP 120/70 | HR 68 | Ht 68.0 in | Wt 145.0 lb

## 2020-11-17 DIAGNOSIS — E7800 Pure hypercholesterolemia, unspecified: Secondary | ICD-10-CM

## 2020-11-17 DIAGNOSIS — R002 Palpitations: Secondary | ICD-10-CM

## 2020-11-17 DIAGNOSIS — I341 Nonrheumatic mitral (valve) prolapse: Secondary | ICD-10-CM | POA: Diagnosis not present

## 2020-11-17 NOTE — Patient Instructions (Signed)

## 2021-01-17 DIAGNOSIS — Z6822 Body mass index (BMI) 22.0-22.9, adult: Secondary | ICD-10-CM | POA: Diagnosis not present

## 2021-01-17 DIAGNOSIS — Z1231 Encounter for screening mammogram for malignant neoplasm of breast: Secondary | ICD-10-CM | POA: Diagnosis not present

## 2021-01-17 DIAGNOSIS — Z01419 Encounter for gynecological examination (general) (routine) without abnormal findings: Secondary | ICD-10-CM | POA: Diagnosis not present

## 2021-01-17 DIAGNOSIS — Z7689 Persons encountering health services in other specified circumstances: Secondary | ICD-10-CM | POA: Diagnosis not present

## 2021-02-12 DIAGNOSIS — Z01419 Encounter for gynecological examination (general) (routine) without abnormal findings: Secondary | ICD-10-CM | POA: Diagnosis not present

## 2021-04-04 ENCOUNTER — Other Ambulatory Visit: Payer: Self-pay | Admitting: Internal Medicine

## 2021-04-09 DIAGNOSIS — G4733 Obstructive sleep apnea (adult) (pediatric): Secondary | ICD-10-CM | POA: Diagnosis not present

## 2021-04-10 ENCOUNTER — Telehealth: Payer: Self-pay | Admitting: Neurology

## 2021-04-10 ENCOUNTER — Other Ambulatory Visit: Payer: Self-pay | Admitting: Internal Medicine

## 2021-04-10 NOTE — Telephone Encounter (Signed)
LVM requesting pt to call back and schedule initial cpap f/u, pt needs to be seen between 05/10/21-07/08/21.

## 2021-04-28 IMAGING — US US BREAST*R* LIMITED INC AXILLA
1 series · 2 of 2 positions shown · non-contrast
Comparison: 09/23/2019 and earlier

CLINICAL DATA: Patient returns after screening study for evaluation
of possible RIGHT breast distortion. No prior RIGHT breast surgery.

EXAM:
DIGITAL DIAGNOSTIC RIGHT MAMMOGRAM WITH CAD AND TOMO
ULTRASOUND RIGHT BREAST

[Series 1: us breast*right* limited inc axilla · 0.06mm/px · 2 of 2 slices shown]
[im 1/2]
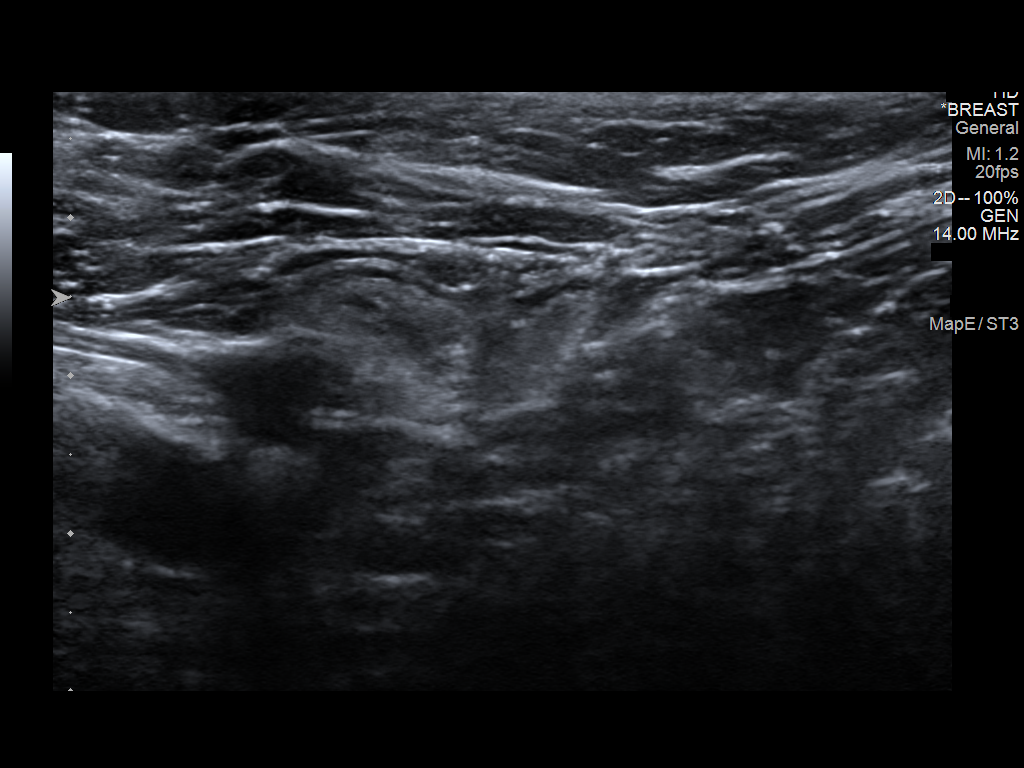
[im 2/2]
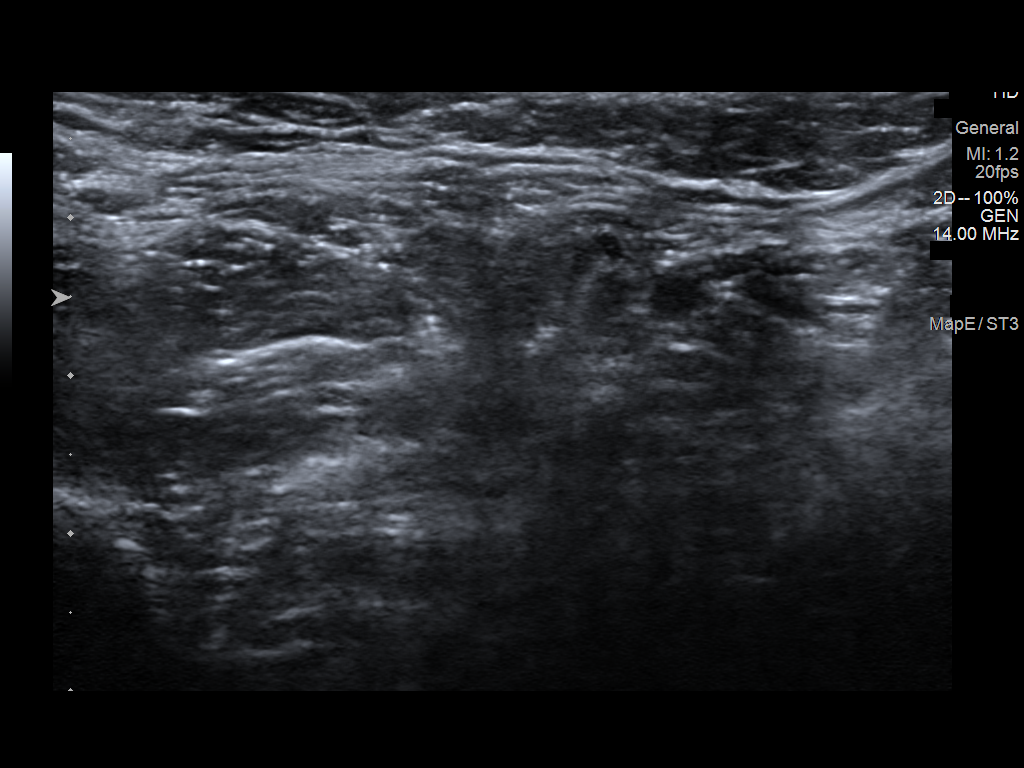

[2 of 2 positions shown; findings below may reference images not displayed]

ACR Breast Density Category c: The breast tissue is heterogeneously
dense, which may obscure small masses.
FINDINGS: Additional 2-D and 3-D images are performed. These views show dense
fibroglandular tissue in the central and superior portion of the
RIGHT breast. There is no persistent distortion.

Mammographic images were processed with CAD.

Targeted ultrasound is performed, showing normal appearing dense
fibroglandular tissue and UPPER and central portions of the RIGHT
breast. No suspicious mass, distortion, or acoustic shadowing is
demonstrated with ultrasound.

Evaluation of the RIGHT axilla is negative for adenopathy.
IMPRESSION: No mammographic or ultrasound evidence for malignancy.

RECOMMENDATION:
Screening mammogram in one year.(Code:WU-[id number])

I have discussed the findings and recommendations with the patient.
If applicable, a reminder letter will be sent to the patient
regarding the next appointment.

BI-RADS CATEGORY  1: Negative.

## 2021-04-30 ENCOUNTER — Telehealth: Payer: Self-pay | Admitting: Neurology

## 2021-04-30 DIAGNOSIS — F418 Other specified anxiety disorders: Secondary | ICD-10-CM

## 2021-04-30 DIAGNOSIS — R0683 Snoring: Secondary | ICD-10-CM

## 2021-04-30 DIAGNOSIS — G4733 Obstructive sleep apnea (adult) (pediatric): Secondary | ICD-10-CM

## 2021-04-30 NOTE — Telephone Encounter (Signed)
Called the patient back and reviewed her download.  There is not much data to go offer based off of the patient's description she states that she feels as if she is getting too much air.  She would wake up feeling bloated and full of air and would have to rip the mask off.  Her mask seals appropriately so she does not feel that it is related to a mask leak.  She already has concerns with acid reflux and feels that this is causing that to be worse.  Advised the patient I would discuss with her sleep work in and see if we can make some changes to the machine that may feel better.  The pressure was decreased from 5-12 down to 5-10.  Her ramp time was off and has now been turned on with a 20-minute ramp time to allow her time to fall asleep.  We increase the EPR from 2 cm to 3 cm.  Advised the patient the settings will go into effect tonight and for her to try to use it for a consistent 7-day stretch so that we can look at the data.

## 2021-04-30 NOTE — Telephone Encounter (Signed)
Pt called wanting the pressure on her CPAP machine to be turned down. Pt requesting a call back.

## 2021-05-10 DIAGNOSIS — G4733 Obstructive sleep apnea (adult) (pediatric): Secondary | ICD-10-CM | POA: Diagnosis not present

## 2021-05-17 ENCOUNTER — Ambulatory Visit: Payer: Federal, State, Local not specified - PPO | Admitting: Neurology

## 2021-05-17 ENCOUNTER — Encounter: Payer: Self-pay | Admitting: Neurology

## 2021-05-17 ENCOUNTER — Other Ambulatory Visit: Payer: Self-pay | Admitting: Neurology

## 2021-05-17 VITALS — BP 113/69 | HR 74 | Ht 68.0 in | Wt 144.0 lb

## 2021-05-17 DIAGNOSIS — G4733 Obstructive sleep apnea (adult) (pediatric): Secondary | ICD-10-CM

## 2021-05-17 DIAGNOSIS — R0683 Snoring: Secondary | ICD-10-CM | POA: Diagnosis not present

## 2021-05-17 DIAGNOSIS — Z789 Other specified health status: Secondary | ICD-10-CM

## 2021-05-17 NOTE — Progress Notes (Signed)
SLEEP MEDICINE CLINIC    Provider:  Larey Seat, MD  Primary Care Physician:  [name], MD [place] [address]     Referring Provider: [name], [place],  [place] [address]          Chief Complaint according to patient   Patient presents with:       Last seen 07/25/20. Here for initial cpap f/u. Has hx GERD. Feels cpap makes sx worse. Feels lungs/stomach bloating. Gets up in the middle of the night to go to bathroom and has painful episodes of burping/gas in the  morning. Stopped using for 2-3 weeks in the last month. Used 8/30 days. Interested in trying mouth guard at this point. Did get rx from current dentistpost Sleep study results and to address memory concerns. Pt states things remain about the same. She is easily distracted.    Renee [name] is a 61 year-old Caucasian female patient and is seen here in a RV on 05/17/2021 ; Pembrolizumab we were able to review her home sleep test results together which dated from 05-1820 but she had such mild apnea and just a lot more dominant presence of snoring than apnea.  I had ordered an auto titration device in the hopes that we can do a trial and see if it makes a huge difference to her sleep quality sleep duration and also may be how her spouse perceives her sleep.  So she was finally with great delay due to supply chain restraints able to get on the CPAP only about October 10th of this year-she has not felt that this helps her a great deal I have reviewed the numeric data from the last 30 days and compliance has been low but due to the feeling that she actually gulps air or feels that she is in lymphatic.  The AutoSet was given with a minimum pressure of 5 and a maximum pressure of 10 cmH2O and 3 cm EPR.  AHI residual was 0.4/h.  Air leaks were moderate the pressure at the 95th percentile was 7.2 which is also considered mild.  So while we do see a reduction in apnea from  5.1-0.4 it has not been easy for the patient to comply. She uses a nasal pillow. Still has aerophagia, bloating.    HISTORY OF PRESENT ILLNESS:  I have the pleasure of meeting again with Renee [name] today who underwent a home sleep test which revealed very mild apnea at an AHI of 5.1 and a higher RDI indicating the presence of louder snoring.  The RDI was 22/h.  There was no clinically relevant oxygen desaturation noted and her heart rate variability was in normal range.  The patient could use as I had said a mandibular advancement device but she is also an misaligned user.  At this time there may be a conflict between using the realignment therapy and yet applying pressure to the lower jaw to move forward.  I would encourage her to talk to her dentist or orthodontist about this there is certainly the alternative option of CPAP.  CPAP will treat snoring just as effectively.  And with an AHI of 5 or above I have the option of ordering a CPAP device.  I would like to add that the patient has a low body mass index low blood pressure and no anatomical risk factors otherwise that would hint at being a risk patient for obstructive sleep apnea.  Marland Kitchen  Chief concern according to patient :  Renee [name] reports that she has been snoring morning more loudly as has been witnessed by her husband.  She is not feeling as well rested and as restored in the morning either.  In addition she has noted heart palpitations and associated shortness of breath which occurs in daytime without warning and is not related to physical exertion.  She describes an example where she was shopping with another family member and had an episode of palpitations while looking at some clothes.  There was no orthostatic change and she was not standing in one place for long time.  She was neither exposed to extreme heat or felt dehydrated at the time.  Some of these palpitations have already occurred in her 30s and 41s.  She is physically active  and participates in Pilates and in August and September had regular physical therapy for a rotator cuff injury of the right shoulder.   I have the pleasure of seeing Renee [name] today, a right-handed White or Caucasian female with a possible sleep disorder.  She  has a past medical history of Abnormal chest x-ray, Allergy, Anxiety, Arthritis, Cataract, Depression, Dyslipidemia, colonic polyps (2008), IBS (irritable bowel syndrome), Infertility, female, Migraines, MVP (mitral valve prolapse), and Osteopenia.    Sleep relevant medical history: Nocturia once at 5 AM, deviated septum repair.GERD- nasal congestion  Family medical /sleep history: No other family member on CPAP with OSA.mother is 36 and has memory loss, hearing loss.  Social history: Patient is retired from [employer] in [place]-  and lives in a household with spouse , watches her grandchildren in daytime. Stepdaughter with kids moved back in.   Family status is married -  with children and grandchildren.  Several family members had [place] over the Christmas holidays 2021, and they kept separate.  Pets are present. Tobacco use; none .  ETOH use; 1-2 / week , Caffeine intake in form of Coffee( 2 cups in AM ) Soda( rarely) Tea ( green tea in Pm, 3-4 / week) or energy drinks. Regular exercise in form of pilates.  Hobbies: gardening.      Sleep habits are as follows: The patient's dinner time is between 5-7 PM.  The patient goes to bed at 12 PM and continues to sleep for 7-8 hours, wakes for one bathroom breaks, the first time at 4-5 AM.  The preferred sleep position is on her left side ( right shoulder was not possible), with the support of 1 pillows. Dreams are reportedly frequent. 9 AM is the usual rise time.The patient wakes up spontaneously.  Snoring witnessed between 4-6  AM. She reports not feeling refreshed or restored in AM, with symptoms such as dry mouth and some residual fatigue. Naps are taken rarely.    Review of Systems: Out of  a complete 14 system review, the patient complains of only the following symptoms, and all other reviewed systems are negative.:  Fatigue, sleepiness , snoring, She also feels she needs longer to find the right words, attention is easily diverted.    How likely are you to doze in the following situations: 0 = not likely, 1 = slight chance, 2 = moderate chance, 3 = high chance   Sitting and Reading? Watching Television? Sitting inactive in a public place (theater or meeting)? As a passenger in a car for an hour without a break? Lying down in the afternoon when circumstances permit? Sitting and talking to someone? Sitting quietly after lunch without alcohol?  In a car, while stopped for a few minutes in traffic?   Total =9/ 24 points   FSS endorsed at 29 / 63 points.   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   Married '94.   Denies any physical or sexual abuse.   Family in [place]: parents and sister.   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   Has smoke alarms, wears seatbelt at all times, uses helmut when appropriate, firearms present in the home, uses herbal remedies, caffeine- 2-3 per day.   No regular exercise.   Social Determinants of Health   Financial Resource Strain: Not on file  Food Insecurity: Not on file  Transportation Needs: Not on file  Physical Activity: Not on file  Stress: Not on file  Social Connections:  Not on file    Family History  Problem Relation Age of Onset   Arthritis Mother        DOB 44   Cancer Mother        Thyroid cancer   Hypertension Mother    Atrial fibrillation Mother    Melanoma Mother    Breast cancer Mother    Arthritis Father        DOB 65   Melanoma Father    Colon polyps Neg Hx    Rectal cancer Neg Hx    Stomach cancer Neg Hx     Past Medical History:  Diagnosis Date   Abnormal chest x-ray    CXR c/w COPD. Full PFTs '11 - normal   Allergy    seasonal   Anxiety    Arthritis    both knees, post traumatic - neck   Cataract    removed bilateral   Depression    with anxiety.   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   Migraines    during teenage years   MVP (mitral valve prolapse)    last 2D echo approx '10   Osteopenia    DEXA 03/2012: -1.0    Past Surgical History:  Procedure Laterality Date   APPENDECTOMY     BUNIONECTOMY WITH HAMMERTOE RECONSTRUCTION Left 07/06/13   [place]   CARDIAC CATHETERIZATION  2006   normal study   CARPAL TUNNEL RELEASE  2011   right hand   HYSTEROPLASTY REPAIR OF UTERINE ANOMALY     2003 - laproscopically; 2006 - laparotomy   LAPAROSCOPIC ENDOMETRIOSIS FULGURATION     1996 and 2002   MANDIBLE RECONSTRUCTION     NASAL SEPTUM SURGERY  11/2012   [facility] FINGER RELEASE  2011   R thimb and ring finger   TRIGGER FINGER RELEASE  02/04/2012   [place], in office  Current Outpatient Medications on File Prior to Visit  Medication Sig Dispense Refill   cetirizine (ZYRTEC) 10 MG tablet Take 10 mg by mouth daily.     DIGESTIVE ENZYMES PO Take 1 tablet by mouth every other day.     fluticasone (FLONASE) 50 MCG/ACT nasal spray SPRAY 2 SPRAYS INTO EACH NOSTRIL EVERY DAY 48 mL 3   metroNIDAZOLE (METROCREAM) 0.75 % cream Apply 1 application topically 2 (two) times daily.     Multiple Vitamin  (MULTIVITAMIN WITH MINERALS) TABS tablet Take 1 tablet by mouth daily.     sertraline (ZOLOFT) 50 MG tablet TAKE 2 TABLETS BY MOUTH DAILY 180 tablet 1   simethicone (MYLICON) 80 MG chewable tablet Chew 80 mg by mouth every 6 (six) hours as needed for flatulence.     Vitamin D, Ergocalciferol, (DRISDOL) 1.25 MG (50000 UNIT) CAPS capsule TAKE 1 CAPSULE BY MOUTH EVERY 7 DAYS 12 capsule 0   No current facility-administered medications on file prior to visit.    Allergies  Allergen Reactions   Codeine Other (See Comments)    Hot flashes/passed out   Pollen Extract Other (See Comments)    drainage    Physical exam:  Today's Vitals   05/17/21 1418  BP: 113/69  Pulse: 74  SpO2: 95%  Weight: 144 lb (65.3 kg)  Height: 5\' 8"  (1.727 m)   Body mass index is 21.9 kg/m.   Wt Readings from Last 3 Encounters:  05/17/21 144 lb (65.3 kg)  11/17/20 145 lb (65.8 kg)  07/25/20 145 lb (65.8 kg)     Ht Readings from Last 3 Encounters:  05/17/21 5\' 8"  (1.727 m)  11/17/20 5\' 8"  (1.727 m)  07/25/20 5' 7.5" (1.715 m)      General: The patient is awake, alert and appears not in acute distress. The patient is well groomed. Head: Normocephalic, atraumatic. Neck is supple. Mallampati 1,  neck circumference:: 13.5 inches . Nasal airflow is patent.  Retrognathia is not  seen.  Dental status: intact  Cardiovascular:  Regular rate and cardiac rhythm by pulse,  without distended neck veins. Respiratory: Lungs are clear to auscultation.  Skin:  Without evidence of ankle edema, or rash. Trunk: The patient's posture is erect.   Neurologic exam : The patient is awake and alert, oriented to place and time.   Memory subjective described as intact.  Attention span & concentration ability appears normal.  Speech is fluent,  without  dysarthria, dysphonia or aphasia.  Mood and affect are appropriate.   Cranial nerves: no loss of smell or taste reported   Montreal Cognitive Assessment  07/25/2020   Visuospatial/ Executive (0/5) 5  Naming (0/3) 3  Attention: Read list of digits (0/2) 2  Attention: Read list of letters (0/1) 1  Attention: Serial 7 subtraction starting at 100 (0/3) 3  Language: Repeat phrase (0/2) 2  Language : Fluency (0/1) 1  Abstraction (0/2) 2  Delayed Recall (0/5) 4  Orientation (0/6) 6  Total 29       After spending a total time of 15 minutes face to face and additional time for physical and neurologic examination, review of laboratory studies,  personal review of imaging studies, reports and results of other testing and review of referral information / records as far as provided in visit, I have established the following assessments: Mild OSA with high RDI- short, Renee [name] is presenting with snoring.     My Plan is to proceed with:  Discontinuation of CPAP since she  cannot tolerate even low pressures with aerophagia, bloating.  This is under a nasal pillow interface. Considering the mild APNEA and dominant snoring,  she will discuss with her orthodontist if a mandibular advancement therapy is possible while being an invisaline patient.  This seems to be the best solution:   I would like to thank [name], [address],  [place] [address] for allowing me to meet with and to take care of this pleasant patient.    CC: PCP .  Electronically signed by: Larey Seat, MD 05/17/2021 3:07 PM  [facility] Board certified by The [employer] of Sleep Medicine and Diplomate of the [employer] of Sleep Medicine. Board certified In Neurology through the [place], Fellow of the [employer] of Neurology. Medical Director of [facility].

## 2021-05-21 ENCOUNTER — Telehealth: Payer: Self-pay | Admitting: Neurology

## 2021-05-21 NOTE — Telephone Encounter (Signed)
I called the office of Dr. Mathis Fare. They are not a sleep dentistry office and do not provide oral appliances. I will send patient's referral to [facility]. Referral form has been placed in nurse pod for MD signature.   [place]: [phone].

## 2021-05-23 NOTE — Telephone Encounter (Signed)
Referral sent to [facility] today.

## 2021-05-30 NOTE — Progress Notes (Signed)
Renee [name] [address] [place] Phone: [phone] Subjective:   IVilma [name], am serving as a scribe for Dr. Hulan Saas. This visit occurred during the SARS-CoV-2 public health emergency.  Safety protocols were in place, including screening questions prior to the visit, additional usage of staff PPE, and extensive cleaning of exam room while observing appropriate contact time as indicated for disinfecting solutions.   I'm seeing this patient by the request  of:  [name], MD  CC: Right shoulder and left elbow pain  [id number]  Last seen December 2021 for right shoulder pain  Updated 05/31/2021 Renee [name] is a 60 y.o. female coming in with complaint of right shoulder and left forearm pain. Does piliates and strength training and did physical therapy. Has to pick up her dog daily multiple times a day for 4 months. Dull and achy only when doing that lifting. Opening jar can feel it in left forearm and elbow. Ice and heat help temporarily.      Past Medical History:  Diagnosis Date   Abnormal chest x-ray    CXR c/w COPD. Full PFTs '11 - normal   Allergy    seasonal   Anxiety    Arthritis    both knees, post traumatic - neck   Cataract    removed bilateral   Depression    with anxiety.   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   Migraines    during teenage years   MVP (mitral valve prolapse)    last 2D echo approx '10   Osteopenia    DEXA 03/2012: -1.0   Past Surgical History:  Procedure Laterality Date   APPENDECTOMY     BUNIONECTOMY WITH HAMMERTOE RECONSTRUCTION Left 07/06/13   [place] CATHETERIZATION  2006   normal study   [place]  2011   right hand   HYSTEROPLASTY REPAIR OF UTERINE ANOMALY     2003 - laproscopically; 2006 - laparotomy   LAPAROSCOPIC ENDOMETRIOSIS  FULGURATION     1996 and 2002   MANDIBLE RECONSTRUCTION     NASAL SEPTUM SURGERY  11/2012   [facility] FINGER RELEASE  2011   R thimb and ring finger   TRIGGER FINGER RELEASE  02/04/2012   [place], in office   Social History   Socioeconomic History   Marital status: Married    Spouse name: Not on file   Number of children: 0   Years of education: 16   Highest education level: Not on file  Occupational History   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   Married '94.   Denies any physical or sexual abuse.   Family in [place]: parents and sister.   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   Has smoke alarms, wears seatbelt at all times, uses helmut when appropriate, firearms present in the home, uses herbal remedies, caffeine- 2-3  per day.   No regular exercise.   Social Determinants of Health   Financial Resource Strain: Not on file  Food Insecurity: Not on file  Transportation Needs: Not on file  Physical Activity: Not on file  Stress: Not on file  Social Connections: Not on file   Allergies  Allergen Reactions   Codeine Other (See Comments)    Hot flashes/passed out   Pollen Extract Other (See Comments)    drainage   Family History  Problem Relation Age of Onset   Arthritis Mother        DOB 82   Cancer Mother        Thyroid cancer   Hypertension Mother    Atrial fibrillation Mother    Melanoma Mother    Breast cancer Mother    Arthritis Father        DOB 31   Melanoma Father    Colon polyps Neg Hx    Rectal cancer Neg Hx    Stomach cancer Neg Hx       Current Outpatient Medications (Respiratory):     cetirizine (ZYRTEC) 10 MG tablet, Take 10 mg by mouth daily.   fluticasone (FLONASE) 50 MCG/ACT nasal spray, SPRAY 2 SPRAYS INTO EACH NOSTRIL EVERY DAY  Current Outpatient Medications (Analgesics):    meloxicam (MOBIC) 7.5 MG tablet, Take 1 tablet (7.5 mg total) by mouth daily.   Current Outpatient Medications (Other):    DIGESTIVE ENZYMES PO, Take 1 tablet by mouth every other day.   metroNIDAZOLE (METROCREAM) 0.75 % cream, Apply 1 application topically 2 (two) times daily.   Multiple Vitamin (MULTIVITAMIN WITH MINERALS) TABS tablet, Take 1 tablet by mouth daily.   sertraline (ZOLOFT) 50 MG tablet, TAKE 2 TABLETS BY MOUTH DAILY   simethicone (MYLICON) 80 MG chewable tablet, Chew 80 mg by mouth every 6 (six) hours as needed for flatulence.   Reviewed prior external information including notes and imaging from  primary care provider As well as notes that were available from care everywhere and other healthcare systems.  Past medical history, social, surgical and family history all reviewed in electronic medical record.  No pertanent information unless stated regarding to the chief complaint.   Review of Systems:  No headache, visual changes, nausea, vomiting, diarrhea, constipation, dizziness, abdominal pain, skin rash, fevers, chills, night sweats, weight loss, swollen lymph nodes, body aches, joint swelling, chest pain, shortness of breath, mood changes. POSITIVE muscle aches  Objective  Blood pressure 112/74, pulse 74, height 5\' 8"  (1.727 m), weight 143 lb (64.9 kg), SpO2 98 %.   General: No apparent distress alert and oriented x3 mood and affect normal, dressed appropriately.  HEENT: Pupils equal, extraocular movements intact  Respiratory: Patient's speak in full sentences and does not appear short of breath  Cardiovascular: No lower extremity edema, non tender, no erythema  Gait normal with good balance and coordination.  MSK: Right shoulder exam shows the patient does have a  positive speeds noted.  Mild positive impingement noted.  Rotator cuff strength though seems to be intact.  Near full range of motion noted.  Mild positive crossover.  Left elbow does have tenderness to palpation over the lateral epicondylar region that is worsening with any type of extension of the wrist against resistance.  Limited muscular skeletal ultrasound was performed and interpreted by Hulan Saas, M  Limited ultrasound of patient's bicep tendon does show hypoechoic changes and significant increase in diameter that is consistent with chronic tendinitis.  No significant intersubstance  tearing noted with Doppler.  Patient does have some degenerative changes noted of the supraspinatus but no acute tear appreciated.  Patient does have moderate arthritic changes of the acromioclavicular joint as well. Impression: Acute tendinitis of the bicep with chronic other findings as stated above    Impression and Recommendations:     The above documentation has been reviewed and is accurate and complete Lyndal Pulley, DO

## 2021-05-31 ENCOUNTER — Ambulatory Visit: Payer: Self-pay

## 2021-05-31 ENCOUNTER — Encounter: Payer: Self-pay | Admitting: Family Medicine

## 2021-05-31 ENCOUNTER — Other Ambulatory Visit: Payer: Self-pay

## 2021-05-31 ENCOUNTER — Ambulatory Visit: Payer: Federal, State, Local not specified - PPO | Admitting: Family Medicine

## 2021-05-31 VITALS — BP 112/74 | HR 74 | Ht 68.0 in | Wt 143.0 lb

## 2021-05-31 DIAGNOSIS — M25511 Pain in right shoulder: Secondary | ICD-10-CM | POA: Diagnosis not present

## 2021-05-31 DIAGNOSIS — M75111 Incomplete rotator cuff tear or rupture of right shoulder, not specified as traumatic: Secondary | ICD-10-CM | POA: Diagnosis not present

## 2021-05-31 DIAGNOSIS — M7712 Lateral epicondylitis, left elbow: Secondary | ICD-10-CM | POA: Diagnosis not present

## 2021-05-31 MED ORDER — MELOXICAM 7.5 MG PO TABS: 7.5000 mg | ORAL_TABLET | Freq: Every day | ORAL | 0 refills | Status: DC

## 2021-05-31 NOTE — Assessment & Plan Note (Signed)
Lateral epicondylitis of the left elbow.  Discussed icing regimen, proper lifting mechanics.  Patient does have an ailing dog that is likely going to need to be put down in the near future.  Patient though is continuing to pick him up on a regular basis now.  Feels like this is exacerbated.  We discussed other ergonomic changes throughout the day especially with typing or lifting.  Follow-up with me again in 6 to 8 weeks

## 2021-05-31 NOTE — Patient Instructions (Addendum)
Do prescribed exercises at least 3x a week Vit D 2000iu Meloxicam 7.5 daily for 10 days then as needed  Voltaren topically See you again in 6 weeks, if not better we can do injections

## 2021-05-31 NOTE — Assessment & Plan Note (Signed)
History of partial tearing likely still has some noted on the ultrasound today.  Patient wants to continue with conservative therapy.  We have discussed the possibility of advanced imaging but patient does not have any significant weakness.  Does have swelling of the bicep tendon noted.

## 2021-05-31 NOTE — Assessment & Plan Note (Signed)
Patient does have some arthritic changes of the acromioclavicular joint noted.  Patient also has a subacromial bursitis.  Do not see any true cortical irregularity with the new diagnosis of the osteopenia.  Discussed with patient about oral anti-inflammatories and given a short course of meloxicam.  Discussed with patient about icing regimen and avoiding certain activities.  Work with Product/process development scientist to learn home exercises.  Follow-up with me again in 6 weeks and if continuing to have pain consider injection

## 2021-06-07 ENCOUNTER — Encounter: Payer: Federal, State, Local not specified - PPO | Admitting: Internal Medicine

## 2021-06-19 ENCOUNTER — Telehealth: Payer: Self-pay

## 2021-06-19 NOTE — Telephone Encounter (Signed)
Pt has been vaccinated, dates added to her chart. She was advised on instructions to add imodium and to stay hydrated. No other questions or concerns.

## 2021-06-19 NOTE — Telephone Encounter (Signed)
Pt tested POS for COVID 06/18/2021.  Pt sxs started on 06/15/2021. Pt sxs are diarrhea, cough, chills, headache, nasal & chest congestion all started on 06/15/2021.  **Pt is taking sudafed and cold & flu nyquil. Afrin at night to help with breathing. Pt states OTC meds are working. However, the diarrhea started today and wants to know what is it she can take or if Dr. Sharlet Salina can rx Paxlovid for her sxs.

## 2021-06-19 NOTE — Telephone Encounter (Signed)
Can you call and find out if she is vaccinated against covid-19? We do not have this on our record. If she is vaccinated she likely do not need antiviral treatment. Can use imodium for diarrhea, make sure to state hydrated. If not vaccinated needs visit for antiviral.

## 2021-06-19 NOTE — Telephone Encounter (Signed)
See below

## 2021-06-28 ENCOUNTER — Encounter: Payer: Self-pay | Admitting: Internal Medicine

## 2021-07-11 ENCOUNTER — Encounter: Payer: Federal, State, Local not specified - PPO | Admitting: Internal Medicine

## 2021-07-12 ENCOUNTER — Ambulatory Visit: Payer: Federal, State, Local not specified - PPO | Admitting: Family Medicine

## 2021-07-25 ENCOUNTER — Ambulatory Visit (INDEPENDENT_AMBULATORY_CARE_PROVIDER_SITE_OTHER): Payer: Federal, State, Local not specified - PPO | Admitting: Internal Medicine

## 2021-07-25 ENCOUNTER — Encounter: Payer: Self-pay | Admitting: Internal Medicine

## 2021-07-25 ENCOUNTER — Other Ambulatory Visit: Payer: Self-pay

## 2021-07-25 VITALS — BP 110/78 | HR 95 | Temp 98.5°F | Ht 68.0 in | Wt 141.8 lb

## 2021-07-25 DIAGNOSIS — K582 Mixed irritable bowel syndrome: Secondary | ICD-10-CM | POA: Diagnosis not present

## 2021-07-25 DIAGNOSIS — G4733 Obstructive sleep apnea (adult) (pediatric): Secondary | ICD-10-CM

## 2021-07-25 DIAGNOSIS — Z808 Family history of malignant neoplasm of other organs or systems: Secondary | ICD-10-CM | POA: Diagnosis not present

## 2021-07-25 DIAGNOSIS — F3341 Major depressive disorder, recurrent, in partial remission: Secondary | ICD-10-CM

## 2021-07-25 DIAGNOSIS — Z23 Encounter for immunization: Secondary | ICD-10-CM | POA: Diagnosis not present

## 2021-07-25 DIAGNOSIS — M8580 Other specified disorders of bone density and structure, unspecified site: Secondary | ICD-10-CM | POA: Diagnosis not present

## 2021-07-25 DIAGNOSIS — Z0000 Encounter for general adult medical examination without abnormal findings: Secondary | ICD-10-CM

## 2021-07-25 DIAGNOSIS — R0602 Shortness of breath: Secondary | ICD-10-CM

## 2021-07-25 LAB — LIPID PANEL
Cholesterol: 241 mg/dL — ABNORMAL HIGH (ref 0–200)
HDL: 81.4 mg/dL (ref >39.00)
LDL Cholesterol: 146 mg/dL — ABNORMAL HIGH (ref 0–99)
NonHDL: 159.32
Total CHOL/HDL Ratio: 3
Triglycerides: 65 mg/dL (ref 0.0–149.0)
VLDL: 13 mg/dL (ref 0.0–40.0)

## 2021-07-25 LAB — VITAMIN D 25 HYDROXY (VIT D DEFICIENCY, FRACTURES): VITD: 35.3 ng/mL (ref 30.00–100.00)

## 2021-07-25 LAB — CBC
HCT: 40.7 % (ref 36.0–46.0)
Hemoglobin: 13.2 g/dL (ref 12.0–15.0)
MCHC: 32.5 g/dL (ref 30.0–36.0)
MCV: 90 fl (ref 78.0–100.0)
Platelets: 217 10*3/uL (ref 150.0–400.0)
RBC: 4.53 Mil/uL (ref 3.87–5.11)
RDW: 13.6 % (ref 11.5–15.5)
WBC: 4.8 10*3/uL (ref 4.0–10.5)

## 2021-07-25 LAB — COMPREHENSIVE METABOLIC PANEL
ALT: 14 U/L (ref 0–35)
AST: 20 U/L (ref 0–37)
Albumin: 4.6 g/dL (ref 3.5–5.2)
Alkaline Phosphatase: 65 U/L (ref 39–117)
BUN: 13 mg/dL (ref 6–23)
CO2: 31 mEq/L (ref 19–32)
Calcium: 9.7 mg/dL (ref 8.4–10.5)
Chloride: 101 mEq/L (ref 96–112)
Creatinine, Ser: 0.89 mg/dL (ref 0.40–1.20)
GFR: 70.62 mL/min (ref >60.00)
Glucose, Bld: 82 mg/dL (ref 70–99)
Potassium: 4.3 mEq/L (ref 3.5–5.1)
Sodium: 138 mEq/L (ref 135–145)
Total Bilirubin: 0.8 mg/dL (ref 0.2–1.2)
Total Protein: 7.6 g/dL (ref 6.0–8.3)

## 2021-07-25 LAB — TSH: TSH: 1.91 u[IU]/mL (ref 0.35–5.50)

## 2021-07-25 MED ORDER — SERTRALINE HCL 50 MG PO TABS: 125.0000 mg | ORAL_TABLET | Freq: Every day | ORAL | 3 refills | Status: DC

## 2021-07-25 NOTE — Progress Notes (Signed)
° °  Subjective:   Patient ID: Renee [name], female    DOB: Sep 27, 1960, 61 y.o.   MRN: [id number]  HPI The patient is a 61 YO female coming in for physical. With some acute concerns as well.  PMH, [facility], social history reviewed and updated  Review of Systems  Constitutional: Negative.   HENT: Negative.    Eyes: Negative.   Respiratory:  Positive for shortness of breath. Negative for cough and chest tightness.   Cardiovascular:  Negative for chest pain, palpitations and leg swelling.  Gastrointestinal:  Negative for abdominal distention, abdominal pain, constipation, diarrhea, nausea and vomiting.  Musculoskeletal: Negative.   Skin: Negative.   Neurological: Negative.   Psychiatric/Behavioral: Negative.     Objective:  Physical Exam Constitutional:      Appearance: She is well-developed.  HENT:     Head: Normocephalic and atraumatic.  Cardiovascular:     Rate and Rhythm: Normal rate and regular rhythm.  Pulmonary:     Effort: Pulmonary effort is normal. No respiratory distress.     Breath sounds: Normal breath sounds. No wheezing or rales.  Abdominal:     General: Bowel sounds are normal. There is no distension.     Palpations: Abdomen is soft.     Tenderness: There is no abdominal tenderness. There is no rebound.  Musculoskeletal:     Cervical back: Normal range of motion.  Skin:    General: Skin is warm and dry.  Neurological:     Mental Status: She is alert and oriented to person, place, and time.     Coordination: Coordination normal.    Vitals:   07/25/21 1123  BP: 110/78  Pulse: 95  Temp: 98.5 F (36.9 C)  TempSrc: Oral  SpO2: 95%  Weight: 141 lb 12.8 oz (64.3 kg)  Height: 5\' 8"  (1.727 m)    This visit occurred during the SARS-CoV-2 public health emergency.  Safety protocols were in place, including screening questions prior to the visit, additional usage of staff PPE, and extensive cleaning of exam room while observing appropriate contact time as indicated  for disinfecting solutions.   Assessment & Plan:  Shingrix IM given at visit

## 2021-07-25 NOTE — Patient Instructions (Signed)
We have given you the shingrix today. We will get the labs and CT and allergy doctor.

## 2021-07-26 DIAGNOSIS — R0609 Other forms of dyspnea: Secondary | ICD-10-CM | POA: Insufficient documentation

## 2021-07-26 DIAGNOSIS — R0602 Shortness of breath: Secondary | ICD-10-CM | POA: Insufficient documentation

## 2021-07-26 NOTE — Assessment & Plan Note (Signed)
Still unpredictable at times and she is using dietary changes to help avoid flares.

## 2021-07-26 NOTE — Assessment & Plan Note (Signed)
Checking TSH today and adjust as needed. No symptoms of high or low thyroid. No dysphagia.

## 2021-07-26 NOTE — Assessment & Plan Note (Signed)
Due to mild nature not thought to be related to SOB.

## 2021-07-26 NOTE — Assessment & Plan Note (Signed)
Having more issues with this and needs increase in dose to 125 mg zoloft daily. This is done and counseled about other coping skills as well.

## 2021-07-26 NOTE — Assessment & Plan Note (Signed)
She has struggled with this for years and PFTs and cardiac evaluation without cause. She had some illness and there is some concern for scar tissue. Checking CT chest to rule out previously undetected problems. Also checking CBC to rule out anemia. Referral to allergy as she is concerned there may be some environmental allergen which is making it feel like she cannot breathe.

## 2021-07-26 NOTE — Assessment & Plan Note (Signed)
Flu shot declines. Covid-19 counseled. Shingrix given 1st. Tetanus up to date. Cologuard with gyn up to date. Mammogram up to date with gyn, pap smear up to date with gyn and dexa up to date with gyn. Counseled about sun safety and mole surveillance. Counseled about the dangers of distracted driving. Given 10 year screening recommendations.

## 2021-07-26 NOTE — Assessment & Plan Note (Signed)
Checking vitamin D and adjust as needed. 

## 2021-08-08 NOTE — Progress Notes (Signed)
[facility] [place] Phone: [phone] Subjective:   Renee [name], am serving as a scribe for Dr. Hulan Saas. This visit occurred during the SARS-CoV-2 public health emergency.  Safety protocols were in place, including screening questions prior to the visit, additional usage of staff PPE, and extensive cleaning of exam room while observing appropriate contact time as indicated for disinfecting solutions.  I'm seeing this patient by the request  of:  [name], MD  CC: Right shoulder pain and left elbow pain follow-up  [id number]  05/31/2021 History of partial tearing likely still has some noted on the ultrasound today.  Patient wants to continue with conservative therapy.  We have discussed the possibility of advanced imaging but patient does not have any significant weakness.  Does have swelling of the bicep tendon noted.  Lateral epicondylitis of the left elbow.  Discussed icing regimen, proper lifting mechanics.  Patient does have an ailing dog that is likely going to need to be put down in the near future.  Patient though is continuing to pick him up on a regular basis now.  Feels like this is exacerbated.  We discussed other ergonomic changes throughout the day especially with typing or lifting.  Follow-up with me again in 6 to 8 weeks  Updated 08/09/2021 Renee [name] is a 61 y.o. female coming in with complaint of L elbow and R shoulder pain. Patient states that she is doing much better. Tightness in shoulder and intermittent soreness in elbow.        Past Medical History:  Diagnosis Date   Abnormal chest x-ray    CXR c/w COPD. Full PFTs '11 - normal   Allergy    seasonal   Anxiety    Arthritis    both knees, post traumatic - neck   Cataract    removed bilateral   Depression    with anxiety.   Dyslipidemia    Hx of colonic polyps 2008   IBS (irritable bowel syndrome)    Infertility, female     failed 3 attempts at in vitro fertilization   Migraines    during teenage years   MVP (mitral valve prolapse)    last 2D echo approx '10   Osteopenia    DEXA 03/2012: -1.0   Past Surgical History:  Procedure Laterality Date   APPENDECTOMY     BUNIONECTOMY WITH HAMMERTOE RECONSTRUCTION Left 07/06/13   [facility]   CARDIAC CATHETERIZATION  2006   normal study   CARPAL TUNNEL RELEASE  2011   right hand   HYSTEROPLASTY REPAIR OF UTERINE ANOMALY     2003 - laproscopically; 2006 - laparotomy   LAPAROSCOPIC ENDOMETRIOSIS FULGURATION     1996 and 2002   MANDIBLE RECONSTRUCTION     NASAL SEPTUM SURGERY  11/2012   [facility] FINGER RELEASE  2011   R thimb and ring finger   TRIGGER FINGER RELEASE  02/04/2012   [place], in office   Social History   Socioeconomic History   Marital status: Married    Spouse name: Not on file   Number of children: 0   Years of education: 16   Highest education level: Not on file  Occupational History   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   Married '94.   Denies any physical or sexual abuse.   Family in [place]: parents and sister.   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   Has smoke alarms, wears seatbelt at all times, uses helmut when appropriate, firearms present in the home, uses herbal remedies, caffeine- 2-3 per day.   [name] regular exercise.   Social Determinants of Health   Financial Resource Strain: Not on file  Food Insecurity: Not on file  Transportation Needs: Not on file  Physical Activity: Not on file  Stress: Not on file  Social Connections: Not on file   Allergies  Allergen  Reactions   Codeine Other (See Comments)    Hot flashes/passed out   Pollen Extract Other (See Comments)    drainage   Family History  Problem Relation Age of Onset   Arthritis Mother        DOB 73   Cancer Mother        Thyroid cancer   Hypertension Mother    Atrial fibrillation Mother    Melanoma Mother    Breast cancer Mother    Arthritis Father        DOB 42   Melanoma Father    Colon polyps Neg Hx    Rectal cancer Neg Hx    Stomach cancer Neg Hx       Current Outpatient Medications (Respiratory):    cetirizine (ZYRTEC) 10 MG tablet, Take 10 mg by mouth daily.   fluticasone (FLONASE) 50 MCG/ACT nasal spray, SPRAY 2 SPRAYS INTO EACH NOSTRIL EVERY DAY    Current Outpatient Medications (Other):    DIGESTIVE ENZYMES PO, Take 1 tablet by mouth every other day.   metroNIDAZOLE (METROCREAM) 0.75 % cream, Apply 1 application topically 2 (two) times daily.   Multiple Vitamin (MULTIVITAMIN WITH MINERALS) TABS tablet, Take 1 tablet by mouth daily.   sertraline (ZOLOFT) 50 MG tablet, Take 2.5 tablets (125 mg total) by mouth daily.   simethicone (MYLICON) 80 MG chewable tablet, Chew 80 mg by mouth every 6 (six) hours as needed for flatulence.   Reviewed prior external information including notes and imaging from  primary care provider As well as notes that were available from care everywhere and other healthcare systems.  Past medical history, social, surgical and family history all reviewed in electronic medical record.  [name] pertanent information unless stated regarding to the chief complaint.   Review of Systems:  [name] headache, visual changes, nausea, vomiting, diarrhea, constipation, dizziness, abdominal pain, skin rash, fevers, chills, night sweats, weight loss, swollen lymph nodes, body aches, joint swelling, chest pain, shortness of breath, mood changes. POSITIVE muscle aches  Objective  Blood pressure 130/80, pulse 72, height 5\' 8"  (1.727 m), weight 143 lb (64.9 kg),  SpO2 95 %.   General: [name] apparent distress alert and oriented x3 mood and affect normal, dressed appropriately.  HEENT: Pupils equal, extraocular movements intact  Respiratory: Patient's speak in full sentences and does not appear short of breath  Cardiovascular: [name] lower extremity edema, non tender, [name] erythema  Gait normal with good balance and coordination.  MSK:  Right shoulder exam shows very mild positive impingement still noted.  Patient does lack the last 5 degrees of extension.  Limited muscular skeletal ultrasound was performed and  interpreted by Hulan Saas, M  Limited ultrasound shows the patient does have very mild hyperechoic changes noted that is consistent with potential scar tissue in the subscapularis and the supraspinatus.  Mild increase enlargement with some degenerative tearing noted at the supraspinatus but [name] significant retraction noted. Impression: Still some degenerative changes of the rotator cuff noted.   Impression and Recommendations:     The above documentation has been reviewed and is accurate and complete Lyndal Pulley, DO

## 2021-08-09 ENCOUNTER — Encounter: Payer: Self-pay | Admitting: Family Medicine

## 2021-08-09 ENCOUNTER — Other Ambulatory Visit: Payer: Self-pay

## 2021-08-09 ENCOUNTER — Ambulatory Visit: Payer: Federal, State, Local not specified - PPO | Admitting: Family Medicine

## 2021-08-09 ENCOUNTER — Ambulatory Visit: Payer: Self-pay

## 2021-08-09 VITALS — BP 130/80 | HR 72 | Ht 68.0 in | Wt 143.0 lb

## 2021-08-09 DIAGNOSIS — M75111 Incomplete rotator cuff tear or rupture of right shoulder, not specified as traumatic: Secondary | ICD-10-CM | POA: Diagnosis not present

## 2021-08-09 DIAGNOSIS — M25511 Pain in right shoulder: Secondary | ICD-10-CM

## 2021-08-09 NOTE — Assessment & Plan Note (Signed)
Patient seems to be doing well overall. Mild learning scarring noted.  Medication seems like she has great strength.  Not affecting daily activities all the time.  We have discussed with her different treatment options but would like to continue with the conservative therapy.  We did discuss the possibility of PRP if needed.  Follow-up in 2 to 3 months

## 2021-08-09 NOTE — Patient Instructions (Signed)
Make Renee Maples do all the heavy lifting and spoil you rotten See you again in 2-3 months

## 2021-08-16 DIAGNOSIS — C44319 Basal cell carcinoma of skin of other parts of face: Secondary | ICD-10-CM | POA: Diagnosis not present

## 2021-08-16 DIAGNOSIS — D485 Neoplasm of uncertain behavior of skin: Secondary | ICD-10-CM | POA: Diagnosis not present

## 2021-08-16 DIAGNOSIS — D2261 Melanocytic nevi of right upper limb, including shoulder: Secondary | ICD-10-CM | POA: Diagnosis not present

## 2021-08-16 DIAGNOSIS — D2271 Melanocytic nevi of right lower limb, including hip: Secondary | ICD-10-CM | POA: Diagnosis not present

## 2021-08-16 DIAGNOSIS — D225 Melanocytic nevi of trunk: Secondary | ICD-10-CM | POA: Diagnosis not present

## 2021-08-16 DIAGNOSIS — D2262 Melanocytic nevi of left upper limb, including shoulder: Secondary | ICD-10-CM | POA: Diagnosis not present

## 2021-08-16 DIAGNOSIS — C4441 Basal cell carcinoma of skin of scalp and neck: Secondary | ICD-10-CM | POA: Diagnosis not present

## 2021-08-21 ENCOUNTER — Ambulatory Visit
Admission: RE | Admit: 2021-08-21 | Discharge: 2021-08-21 | Disposition: A | Discharge status: Home / Self Care | Payer: Federal, State, Local not specified - PPO | Source: Ambulatory Visit | Attending: Internal Medicine | Admitting: Internal Medicine

## 2021-08-21 ENCOUNTER — Other Ambulatory Visit: Payer: Self-pay

## 2021-08-21 DIAGNOSIS — R0602 Shortness of breath: Secondary | ICD-10-CM

## 2021-08-21 DIAGNOSIS — R911 Solitary pulmonary nodule: Secondary | ICD-10-CM | POA: Diagnosis not present

## 2021-08-21 DIAGNOSIS — I700 Atherosclerosis of aorta: Secondary | ICD-10-CM | POA: Diagnosis not present

## 2021-08-21 DIAGNOSIS — J449 Chronic obstructive pulmonary disease, unspecified: Secondary | ICD-10-CM | POA: Diagnosis not present

## 2021-08-27 DIAGNOSIS — C44319 Basal cell carcinoma of skin of other parts of face: Secondary | ICD-10-CM | POA: Diagnosis not present

## 2021-08-31 NOTE — Progress Notes (Addendum)
Office Visit    Patient Name: Renee [name] Date of Encounter: 09/03/2021  PCP:  [name], MD   [place]  Cardiologist:  Kirk Ruths, MD  Advanced Practice Provider:  No care team member to display Electrophysiologist:  None   Chief Complaint    Renee [name] is a 61 y.o. female with a hx of palpitations, coronary artery disease, ascending aorta dilation, allergic rhinitis presents today for follow up after CT chest.   Past Medical History    Past Medical History:  Diagnosis Date   Abnormal chest x-ray    CXR c/w COPD. Full PFTs '11 - normal   Allergy    seasonal   Anxiety    Arthritis    both knees, post traumatic - neck   Cataract    removed bilateral   Depression    with anxiety.   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   Migraines    during teenage years   MVP (mitral valve prolapse)    last 2D echo approx '10   Osteopenia    DEXA 03/2012: -1.0   Past Surgical History:  Procedure Laterality Date   APPENDECTOMY     BUNIONECTOMY WITH HAMMERTOE RECONSTRUCTION Left 07/06/13   [facility]   CARDIAC CATHETERIZATION  2006   normal study   CARPAL TUNNEL RELEASE  2011   right hand   HYSTEROPLASTY REPAIR OF UTERINE ANOMALY     2003 - laproscopically; 2006 - laparotomy   LAPAROSCOPIC ENDOMETRIOSIS FULGURATION     1996 and 2002   MANDIBLE RECONSTRUCTION     NASAL SEPTUM SURGERY  11/2012   [facility] FINGER RELEASE  2011   R thimb and ring finger   TRIGGER FINGER RELEASE  02/04/2012   [place], in office    Allergies  Allergies  Allergen Reactions   Codeine Other (See Comments)    Hot flashes/passed out   Pollen Extract Other (See Comments)    drainage    History of Present Illness    Renee [name] is a 61 y.o. female with a hx of palpitations, coronary artery disease, ascending aorta dilation, allergic  rhinitis last seen 11/17/2020 by Dr. Stanford Breed.  Initially seen in consult October 2021 by Dr. Stanford Breed due to palpitations.  Echocardiogram August 2011 with normal LVEF, mitral valve prolapse, trace MR. Last seen 11/17/2020 by Dr. Stanford Breed with improvement in palpitations.  No changes made recommended for follow-up in 1 year.  Description of palpitations consistent with PAC or PVC.  She was recommended to monitor with cardia mobile device.  Repeat echocardiogram 05/2020 LVEF 70 to 75%, no RWMA, normal diastolic parameters, RV normal size and function, trivial MR, no definitive MVP.  CT chest without contrast performed 08/21/2021 demonstrated aneurysmal ascending aorta 4.1 cm recommended for annual imaging, multiple scattered pulmonary nodules (no follow-up needed if low risk), aortic atherosclerosis including left anterior descending coronary artery calcification.   She presents today for follow up.  She has longstanding history of dyspnea predominantly at rest and notes this is gotten worse over the last couple years.  Feels as if she cannot take a deep breath.  We reviewed her CT scan in detail. Reports no  dyspnea on exertion. Reports no chest pain, pressure, or tightness. No edema, orthopnea, PND. Reports no palpitations.  She endorses having made dietary changes since  her CT scan.  She does help care for her mother who has dementia.  She exercises by doing an hour strength training class on Mondays and an hour-long cardio class on Tuesday.  Plans to increase her exercise by starting to walk the dog.   EKGs/Labs/Other Studies Reviewed:   The following studies were reviewed today:  Echo 05/2020  1. Left ventricular ejection fraction, by estimation, is 70 to 75%. The  left ventricle has hyperdynamic function. The left ventricle has no  regional wall motion abnormalities. Left ventricular diastolic parameters  were normal.   2. Right ventricular systolic function is normal. The right ventricular   size is normal. There is normal pulmonary artery systolic pressure.   3. No definite MVP.Marland Kitchen The mitral valve is normal in structure. Trivial  mitral valve regurgitation.   4. The aortic valve is tricuspid. Aortic valve regurgitation is not  visualized.   5. The inferior vena cava is normal in size with greater than 50%  respiratory variability, suggesting right atrial pressure of 3 mmHg.   CT Chest 08/01/21 IMPRESSION: 1. 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 Recommend annual imaging followup by CTA or MRA. This recommendation follows 2010 ACCF/AHA/AATS/ACR/ASA/SCA/SCAI/SIR/STS/SVM Guidelines for the Diagnosis and Management of Patients with Thoracic Aortic Disease. Circulation. 2010; 121: L893-T342. Aortic aneurysm NOS (ICD10-I71.9) 2. Multiple scattered pulmonary micronodules. No follow-up needed if patient is low-risk (and has no known or suspected primary neoplasm). Non-contrast chest CT can be considered in 12 months if patient is high-risk. This recommendation follows the consensus statement: Guidelines for Management of Incidental Pulmonary Nodules Detected on CT Images: From the Fleischner Society 2017; Radiology 2017; 284:228-243. 3. Aortic Atherosclerosis (ICD10-I70.0) including left anterior descending coronary calcification.   EKG:  EKG is ordered today.  The ekg ordered today demonstrates normal sinus rhythm 72 bpm with stable RBBB.  Recent Labs: 07/25/2021: ALT 14; BUN 13; Creatinine, Ser 0.89; Hemoglobin 13.2; Platelets 217.0; Potassium 4.3; Sodium 138; TSH 1.91  Recent Lipid Panel    Component Value Date/Time   CHOL 241 (H) 07/25/2021 1158   TRIG 65.0 07/25/2021 1158   HDL 81.40 07/25/2021 1158   CHOLHDL 3 07/25/2021 1158   VLDL 13.0 07/25/2021 1158   LDLCALC 146 (H) 07/25/2021 1158   LDLDIRECT 143.1 04/09/2013 0952   Home Medications   Current Meds  Medication Sig   aspirin EC 81 MG tablet Take 1 tablet (81 mg total) by mouth daily. Swallow whole.    cetirizine (ZYRTEC) 10 MG tablet Take 10 mg by mouth daily.   DIGESTIVE ENZYMES PO Take 1 tablet by mouth every other day.   fluticasone (FLONASE) 50 MCG/ACT nasal spray SPRAY 2 SPRAYS INTO EACH NOSTRIL EVERY DAY   metoprolol tartrate (LOPRESSOR) 100 MG tablet Take one tablet two hours prior to cardiac CTA   metroNIDAZOLE (METROCREAM) 0.75 % cream Apply 1 application topically 2 (two) times daily.   Multiple Vitamin (MULTIVITAMIN WITH MINERALS) TABS tablet Take 1 tablet by mouth daily.   rosuvastatin (CRESTOR) 20 MG tablet Take 1 tablet (20 mg total) by mouth daily.   sertraline (ZOLOFT) 50 MG tablet Take 2.5 tablets (125 mg total) by mouth daily.   simethicone (MYLICON) 80 MG chewable tablet Chew 80 mg by mouth every 6 (six) hours as needed for flatulence.     Review of Systems      All other systems reviewed and are otherwise negative except as noted above.  Physical Exam    VS:  BP 110/68 (BP Location: Left Arm,  Patient Position: Sitting, Cuff Size: Normal)    Pulse 72    Ht 5' 8"  (1.727 m)    Wt 140 lb (63.5 kg)    BMI 21.29 kg/m  , BMI Body mass index is 21.29 kg/m.  Wt Readings from Last 3 Encounters:  09/03/21 140 lb (63.5 kg)  08/09/21 143 lb (64.9 kg)  07/25/21 141 lb 12.8 oz (64.3 kg)     GEN: Well nourished, well developed, in no acute distress. HEENT: normal. Neck: Supple, no JVD, carotid bruits, or masses. Cardiac: RRR, no murmurs, rubs, or gallops. No clubbing, cyanosis, edema.  Radials/PT 2+ and equal bilaterally.  Respiratory:  Respirations regular and unlabored, clear to auscultation bilaterally. GI: Soft, nontender, nondistended. MS: No deformity or atrophy. Skin: Warm and dry, no rash. Neuro:  Strength and sensation are intact. Psych: Normal affect.  Assessment & Plan    Aortic atherosclerosis /coronary artery calcification /dilation of ascending aorta -CT chest 08/21/2021 ordered by primary care with aneurysmal ascending aorta 4.1 cm, aortic atherosclerosis  including left anterior descending coronary artery calcification.  She reports no chest pain though does have shortness of breath at rest which has been progressive over the last couple of years.  EKG today with no acute ST/T wave changes.  Lengthy discussion regarding pathophysiology and treatment of aortic atherosclerosis, coronary artery disease, ascending aortic aneurysm. Will need repeat CT chest 08/2022 for monitoring of ascending aortic aneurysm.  Can be coordinated at follow-up. Plan for cardiac CTA due to new finding of coronary disease, dyspnea to rule out obstructive disease.BMP today to assess renal function prior to testing.  For GDMT of CAD start Crestor 26m QD, Aspirin EC 869mQD. Could consider addition of low dose beta blocker at follow up. Per Dr. CrStanford Breed low dose would be permissible in setting of RBBB. Continue optimal BP control <130/80.   Palpitations - Prior evaluation by Dr. CrStanford Breed Reports no recurrence.  No indication for further work-up at this time.  History of mitral valve prolapse - Not noted by most recent echo 05/2020. Trivial MR. Continue optimal BP control.  Periodic monitoring with echocardiogram as clinically indicated.  Hyperlipidemia - LDL goal <70 in setting of coronary artery disease. 07/25/21 total cholesterol 241, triglycerides 65, HDL 81, LDL 146, AST 20, ALT 14.  Start Crestor 20 mg daily with plans for repeat c-Met, FLP in 6 to 8 weeks.  If LDL not at goal consider increased dose versus addition of Zetia.  RBBB -stable finding by EKG.  Denies lightheadedness, dizziness, near syncope, CP.  Disposition: Follow up in 1 month(s) with BrKirk RuthsMD or APP.  Signed, CaLoel DubonnetNP 09/03/2021, 12:23 PM [place]

## 2021-09-03 ENCOUNTER — Ambulatory Visit (INDEPENDENT_AMBULATORY_CARE_PROVIDER_SITE_OTHER): Payer: Federal, State, Local not specified - PPO | Admitting: Family

## 2021-09-03 ENCOUNTER — Encounter (HOSPITAL_BASED_OUTPATIENT_CLINIC_OR_DEPARTMENT_OTHER): Payer: Self-pay

## 2021-09-03 ENCOUNTER — Other Ambulatory Visit: Payer: Self-pay

## 2021-09-03 ENCOUNTER — Encounter (HOSPITAL_BASED_OUTPATIENT_CLINIC_OR_DEPARTMENT_OTHER): Payer: Self-pay | Admitting: Family

## 2021-09-03 VITALS — BP 110/68 | HR 72 | Ht 68.0 in | Wt 140.0 lb

## 2021-09-03 DIAGNOSIS — E785 Hyperlipidemia, unspecified: Secondary | ICD-10-CM

## 2021-09-03 DIAGNOSIS — I7121 Aneurysm of the ascending aorta, without rupture: Secondary | ICD-10-CM

## 2021-09-03 DIAGNOSIS — I4510 Unspecified right bundle-branch block: Secondary | ICD-10-CM

## 2021-09-03 DIAGNOSIS — I25118 Atherosclerotic heart disease of native coronary artery with other forms of angina pectoris: Secondary | ICD-10-CM | POA: Diagnosis not present

## 2021-09-03 LAB — BASIC METABOLIC PANEL
BUN/Creatinine Ratio: 19 (ref 12–28)
BUN: 15 mg/dL (ref 8–27)
CO2: 24 mmol/L (ref 20–29)
Calcium: 9.6 mg/dL (ref 8.7–10.3)
Chloride: 101 mmol/L (ref 96–106)
Creatinine, Ser: 0.81 mg/dL (ref 0.57–1.00)
Glucose: 85 mg/dL (ref 70–99)
Potassium: 4.3 mmol/L (ref 3.5–5.2)
Sodium: 137 mmol/L (ref 134–144)
eGFR: 83 mL/min/{1.73_m2} (ref >59)

## 2021-09-03 MED ORDER — ROSUVASTATIN CALCIUM 20 MG PO TABS: 20.0000 mg | ORAL_TABLET | Freq: Every day | ORAL | 1 refills | Status: DC

## 2021-09-03 MED ORDER — ASPIRIN EC 81 MG PO TBEC: 81.0000 mg | DELAYED_RELEASE_TABLET | Freq: Every day | ORAL | 3 refills | Status: AC

## 2021-09-03 MED ORDER — METOPROLOL TARTRATE 100 MG PO TABS: ORAL_TABLET | ORAL | 0 refills | Status: DC

## 2021-09-03 NOTE — Patient Instructions (Addendum)
Medication Instructions:  ?Your physician has recommended you make the following change in your medication:  ? ?START Rosuvastatin one '20mg'$  tablet daily ? ?TAKE Metoprolol '100mg'$  two hours prior to your cardiac CTA ? ?*If you need a refill on your cardiac medications before your next appointment, please call your pharmacy* ? ?Lab Work: ?Your physician recommends that you return for lab work today for [employer].   ? ?Your physician recommends that you return for lab work in 6-8 weeks for CMP and fasting lipid panel ? ?Testing/Procedures: ?Your physician has requested that you have cardiac CT. Cardiac computed tomography (CT) is a painless test that uses an x-ray machine to take clear, detailed pictures of your heart. Please follow instruction sheet as given. ? ?Follow-Up: ?At [facility], you and your health needs are our priority.  As part of our continuing mission to provide you with exceptional heart care, we have created designated Provider Care Teams.  These Care Teams include your primary Cardiologist (physician) and Advanced Practice Providers (APPs -  Physician Assistants and Nurse Practitioners) who all work together to provide you with the care you need, when you need it. ? ?We recommend signing up for the patient portal called "MyChart".  Sign up information is provided on this After Visit Summary.  MyChart is used to connect with patients for Virtual Visits (Telemedicine).  Patients are able to view lab/test results, encounter notes, upcoming appointments, etc.  Non-urgent messages can be sent to your provider as well.   ?To learn more about what you can do with MyChart, go to [email or web address].   ? ?Your next appointment:   ?As scheduled with Loel Dubonnet, NP  ? ?Other Instructions ?  ?Heart Healthy Diet Recommendations: ?A low-salt diet is recommended. Meats should be grilled, baked, or boiled. Avoid fried foods. Focus on lean protein sources like fish or chicken with vegetables and fruits. The  American Heart Association is a Microbiologist!  American Heart Association Diet and Lifeystyle Recommendations   ? ?Exercise recommendations: ?The American Heart Association recommends 150 minutes of moderate intensity exercise weekly. ?Try 30 minutes of moderate intensity exercise 4-5 times per week. ?This could include walking, jogging, or swimming.  ? ?Information About Your Aneurysm ? ?One of your tests has shown an aneurysm of your ascending aorta. The word "aneurysm" refers to a bulge in an artery (blood vessel). Most people think of them in the context of an emergency, but yours was found incidentally. At this point there is nothing you need to do from a procedure standpoint, but there are some important things to keep in mind for day-to-day life. ? ?Mainstays of therapy for aneurysms include very good blood pressure control, healthy lifestyle, and avoiding tobacco products and street drugs. Research has raised concern that antibiotics in the fluoroquinolone class could be associated with increased risk of having an aneurysm develop or tear. This includes medicines that end in "floxacin," like Cipro or Levaquin. Make sure to discuss this information with other healthcare providers if you require antibiotics. ? ?Since aneurysms can run in families, you should discuss your diagnosis with first degree relatives as they may need to be screened for this. Regular mild-moderate physical exercise is important, but avoid heavy lifting/weight lifting over 30lbs, chopping wood, shoveling snow or digging heavy earth with a shovel. It is best to avoid activities that cause grunting or straining (medically referred to as a "Valsalva maneuver"). This happens when a person bears down against a closed throat to increase the strength  of arm or abdominal muscles. There's often a tendency to do this when lifting heavy weights, doing sit-ups, push-ups or chin-ups, etc., but it may be harmful. ? ?This is a finding I would expect  to be monitored periodically by your cardiology team. Most unruptured thoracic aortic aneurysms cause no symptoms, so they are often found during exams for other conditions. Contact a health care provider if you develop any discomfort in your upper back, neck, abdomen, trouble swallowing, cough or hoarseness, or unexplained weight loss. Get help right away if you develop severe pain in your upper back or abdomen that may move into your chest and arms, or any other concerning symptoms such as shortness of breath or fever. ? ? ? ?Your cardiac CT will be scheduled at one of the below locations:  ? ?[facility] ?[address] ?[place], [place] [address] ?[phone] ? ? ?If scheduled at [facility], please arrive at the [facility] and Children's Entrance (Entrance C2) of [facility] 30 minutes prior to test start time. ?You can use the FREE valet parking offered at entrance C (encouraged to control the heart rate for the test)  ?Proceed to the [facility] Radiology Department (first floor) to check-in and test prep. ? ?All radiology patients and guests should use entrance C2 at [facility], accessed from [facility], even though the hospital's physical address listed is [address] ? ? ? ? ? ?Please follow these instructions carefully (unless otherwise directed): ? ?On the Day of the Test: ?Drink plenty of water until 1 hour prior to the test. ?Do not eat any food 4 hours prior to the test. ?You may take your regular medications prior to the test.  ?Take metoprolol (Lopressor) two hours prior to test. ?FEMALES- please wear underwire-free bra if available, avoid dresses & tight clothing ? ?    ?After the Test: ?Drink plenty of water. ?After receiving IV contrast, you may experience a mild flushed feeling. This is normal. ?On occasion, you may experience a mild rash up to 24 hours after the test. This is not dangerous. If this occurs, you can take Benadryl 25 mg  and increase your fluid intake. ?If you experience trouble breathing, this can be serious. If it is severe call 911 IMMEDIATELY. If it is mild, please call our office. ?If you take any of these medications: Glipizide/Metformin, Avandament, Glucavance, please do not take 48 hours after completing test unless otherwise instructed. ? ?We will call to schedule your test 2-4 weeks out understanding that some insurance companies will need an authorization prior to the service being performed.  ? ?For non-scheduling related questions, please contact the cardiac imaging nurse navigator should you have any questions/concerns: ?Marchia Bond, Cardiac Imaging Nurse Navigator ?Gordy Clement, Cardiac Imaging Nurse Navigator ?[facility] ?Direct Office Dial: [phone]  ? ?For scheduling needs, including cancellations and rescheduling, please call [place], [phone].  ? ?

## 2021-09-04 ENCOUNTER — Encounter (HOSPITAL_BASED_OUTPATIENT_CLINIC_OR_DEPARTMENT_OTHER): Payer: Self-pay | Admitting: Family

## 2021-09-13 ENCOUNTER — Telehealth (HOSPITAL_COMMUNITY): Payer: Self-pay | Admitting: *Deleted

## 2021-09-13 NOTE — Telephone Encounter (Signed)
Reaching out to patient to offer assistance regarding upcoming cardiac imaging study; pt verbalizes understanding of appt date/time, parking situation and where to check in, pre-test NPO status and medications ordered, and verified current allergies; name and call back number provided for further questions should they arise ? ?Renee Clement RN Navigator Cardiac Imaging ?[name] ?[phone] office ?[phone] cell ? ?Patient to take '50mg'$  metoprolol tartrate two hours prior to her cardiac CT. She is aware to arrive at 1:30pm for her 2pm scan. ?

## 2021-09-14 ENCOUNTER — Other Ambulatory Visit: Payer: Self-pay

## 2021-09-14 ENCOUNTER — Ambulatory Visit (HOSPITAL_COMMUNITY)
Admission: RE | Admit: 2021-09-14 | Discharge: 2021-09-14 | Disposition: A | Discharge status: Home / Self Care | Payer: Federal, State, Local not specified - PPO | Source: Ambulatory Visit | Attending: Family | Admitting: Family

## 2021-09-14 DIAGNOSIS — I25118 Atherosclerotic heart disease of native coronary artery with other forms of angina pectoris: Secondary | ICD-10-CM | POA: Insufficient documentation

## 2021-09-14 MED ORDER — NITROGLYCERIN 0.4 MG SL SUBL
SUBLINGUAL_TABLET | SUBLINGUAL | Status: AC
  Filled 2021-09-14: qty 1

## 2021-09-14 MED ORDER — IOHEXOL 350 MG/ML SOLN
100.0000 mL | Freq: Once | INTRAVENOUS | Status: AC | PRN
Start: 2021-09-14 — End: 2021-09-14
  Administered 2021-09-14: 100 mL via INTRAVENOUS

## 2021-09-14 MED ORDER — NITROGLYCERIN 0.4 MG SL SUBL
0.4000 mg | SUBLINGUAL_TABLET | Freq: Once | SUBLINGUAL | Status: AC
  Administered 2021-09-14: 0.4 mg via SUBLINGUAL

## 2021-09-17 ENCOUNTER — Telehealth (HOSPITAL_BASED_OUTPATIENT_CLINIC_OR_DEPARTMENT_OTHER): Payer: Self-pay

## 2021-09-17 DIAGNOSIS — I77819 Aortic ectasia, unspecified site: Secondary | ICD-10-CM

## 2021-09-17 DIAGNOSIS — R072 Precordial pain: Secondary | ICD-10-CM

## 2021-09-17 NOTE — Addendum Note (Signed)
Addended by: Gerald Stabs on: 09/17/2021 02:58 PM ? ? Modules accepted: Orders ? ?

## 2021-09-17 NOTE — Telephone Encounter (Addendum)
Wrong test discontinued and correct test ordered ? ? ? ?----- Message from Loel Dubonnet, NP sent at 09/17/2021  9:14 AM EDT ----- ?Noncardiac read of CT shows mildly dilated ascending aorta 3.9cm - repeat CT aorta in 1 year for monitoring. Cardiac CTA shows coronary calcium score of 146 and moderate nonobstructive coronary disease. Recommend aggressive secondary prevention by continuing Aspirin, Rosuvastatin, heart healthy diet, exercise. Would benefit from PCSK9i (injectable cholesterol medication) - please offer referral to lipid clinic.  ?

## 2021-09-18 ENCOUNTER — Other Ambulatory Visit: Payer: Self-pay

## 2021-09-18 ENCOUNTER — Ambulatory Visit: Payer: Federal, State, Local not specified - PPO | Admitting: Allergy & Immunology

## 2021-09-18 ENCOUNTER — Encounter: Payer: Self-pay | Admitting: Allergy & Immunology

## 2021-09-18 VITALS — BP 104/68 | HR 95 | Temp 97.9°F | Resp 20 | Ht 67.32 in | Wt 138.4 lb

## 2021-09-18 DIAGNOSIS — R0602 Shortness of breath: Secondary | ICD-10-CM

## 2021-09-18 DIAGNOSIS — J301 Allergic rhinitis due to pollen: Secondary | ICD-10-CM

## 2021-09-18 DIAGNOSIS — K219 Gastro-esophageal reflux disease without esophagitis: Secondary | ICD-10-CM

## 2021-09-18 DIAGNOSIS — J45998 Other asthma: Secondary | ICD-10-CM | POA: Diagnosis not present

## 2021-09-18 DIAGNOSIS — J4540 Moderate persistent asthma, uncomplicated: Secondary | ICD-10-CM | POA: Diagnosis not present

## 2021-09-18 MED ORDER — BUDESONIDE-FORMOTEROL FUMARATE 80-4.5 MCG/ACT IN AERO: 2.0000 | INHALATION_SPRAY | Freq: Two times a day (BID) | RESPIRATORY_TRACT | 5 refills | Status: DC

## 2021-09-18 MED ORDER — MONTELUKAST SODIUM 10 MG PO TABS: 10.0000 mg | ORAL_TABLET | Freq: Every day | ORAL | 5 refills | Status: DC

## 2021-09-18 MED ORDER — ALBUTEROL SULFATE HFA 108 (90 BASE) MCG/ACT IN AERS: 2.0000 | INHALATION_SPRAY | Freq: Four times a day (QID) | RESPIRATORY_TRACT | 2 refills | Status: DC | PRN

## 2021-09-18 NOTE — Patient Instructions (Addendum)
1. SOB (shortness of breath) ?- Lung testing looked normal and it improved ever so slightly with the albuterol treatment (only 1% improvement in the FEV1, but you had 12% improvement in the middle airways).  ?- This certainly does not prove asthma, but it can point towards it. ?- We were going to start Symbicort which contains a long albuterol combined with an inhaled steroid. ?- Spacer sample and demonstration provided. ?- Daily controller medication(s): Symbicort 80/4.45mg two puffs twice daily with spacer ?- Prior to physical activity: albuterol 2 puffs 10-15 minutes before physical activity. ?- Rescue medications: albuterol 4 puffs every 4-6 hours as needed ?- Asthma control goals:  ?* Full participation in all desired activities (may need albuterol before activity) ?* Albuterol use two time or less a week on average (not counting use with activity) ?* Cough interfering with sleep two time or less a month ?* Oral steroids no more than once a year ?* No hospitalizations ? ?2. Seasonal allergic rhinitis due to pollen ?- Testing today showed: grasses. ?- Copy of test results provided.  ?- Avoidance measures provided. ?- Continue with: Zyrtec (cetirizine) '10mg'$  tablet once daily ?- Start taking: Singulair (montelukast) '10mg'$  daily ?- Montelukast has been associated with increased irritability and bad dreams, so they might want to wait and start that after the festivities this coming weekend, just in case you do have an adverse reaction to it. ? ?3. Gastroesophageal reflux disease ?- Start Pepcid (famotidine) 40 mg daily. ?- This one (unlike omeprazole) is not associated with bone demineralization when used daily. ? ?4. Return in about 4 weeks (around 10/16/2021).  ? ? ?Please inform [place]of any Emergency Department visits, hospitalizations, or changes in symptoms. Call u[place]abefore going to the ED for breathing or allergy symptoms since we might be able to fit you in for a sick visit. Feel free to contact u[place]anytime with  any questions, problems, or concerns. ? ?It was a pleasure to meet you today! ? ?Websites that have reliable patient information: ?1. American Academy of Asthma, Allergy, and Immunology: www.aaaai.org ?2. Food Allergy Research and Education (FARE): foodallergy.org ?3. Mothers of Asthmatics: http://www.asthmacommunitynetwork.org ?4. [employer] Corporationof Allergy, Asthma, and Immunology: wMonthlyElectricBill.co.uk? ? ?COVID-19 Vaccine Information can be found at: [email or web address] questions related to vaccine distribution or appointments, please email [email or web address] or call [phone]  ? ?We realize that you might be concerned about having an allergic reaction to the COVID19 vaccines. To help with that concern, WE ARE OFFERING THE COVID19 VACCINES IN OUR OFFICE! Ask the front desk for dates!  ? ? ? ??Like? u[place]on Facebook and Instagram for our latest updates!  ?  ? ? ?A healthy democracy works best when ANew York Life Insuranceparticipate! Make sure you are registered to vote! If you have moved or changed any of your contact information, you will need to get this updated before voting! ? ?In some cases, you MAY be able to register to vote online: [email or web address]? ? ? ? Airborne Adult Perc - 09/18/21 1000   ? ? Time Antigen Placed 10981  ? Allergen Manufacturer GLavella Hammock  ? Location Back   ? Number of Test 59   ? 1. Control-Buffer 50% Glycerol Negative   ? 2. Control-Histamine 1 mg/ml 3+   ? 3. Albumin saline Negative   ? 4. BOlneyNegative   ? 5. BGuatemalaNegative   ? 6. Johnson 2+   ? 7. KCanbyBlue Negative   ? 8. Meadow Fescue Negative   ?  9. Perennial Rye Negative   ? 10. Sweet Vernal Negative   ? 11. Timothy Negative   ? 12. Cocklebur Negative   ? 13. Burweed Marshelder Negative   ? 14. Ragweed, short Negative   ? 15. Ragweed, Giant Negative   ? 16. Plantain,  English Negative   ? 17. Lamb's Quarters Negative   ? 18. Sheep Sorrell  Negative   ? 19. Rough Pigweed Negative   ? 20. Marsh Elder, Rough Negative   ? 21. Mugwort, Common Negative   ? 22. Ash mix Negative   ? 23. Wendee Copp mix Negative   ? 24. Beech American Negative   ? 25. Box, Elder Negative   ? 26. Cedar, red Negative   ? 27. Cottonwood, [place] Negative   ? 28. Elm mix Negative   ? 29. Hickory Negative   ? 30. Maple mix Negative   ? 31. Oak, [place] mix Negative   ? 32. Pecan Pollen Negative   ? 33. Pine mix Negative   ? 34. Sycamore Eastern Negative   ? 35. Walnut, Black Pollen Negative   ? 36. Alternaria alternata Negative   ? 81. Cladosporium Herbarum Negative   ? 38. Aspergillus mix Negative   ? 39. Penicillium mix Negative   ? 40. Bipolaris sorokiniana (Helminthosporium) Negative   ? 41. Drechslera spicifera (Curvularia) Negative   ? 42. Mucor plumbeus Negative   ? 43. Fusarium moniliforme Negative   ? 44. Aureobasidium pullulans (pullulara) Negative   ? 45. Rhizopus oryzae Negative   ? 46. Botrytis cinera Negative   ? 47. Epicoccum nigrum Negative   ? 48. Phoma betae Negative   ? 49. Candida Albicans Negative   ? 50. Trichophyton mentagrophytes Negative   ? 51. Mite, D Farinae  5,000 AU/ml Negative   ? 52. Mite, D Pteronyssinus  5,000 AU/ml Negative   ? 53. Cat Hair 10,000 BAU/ml Negative   ? 54.  Dog Epithelia Negative   ? 55. Mixed Feathers Negative   ? 56. Horse Epithelia Negative   ? 57. Cockroach, [place] Negative   ? 58. Mouse Negative   ? 59. Tobacco Leaf Negative   ? ?  ?  ? ?  ? ? Intradermal - 09/18/21 1102   ? ? Time Antigen Placed 1102   ? Allergen Manufacturer Lavella Hammock   ? Location Arm   ? Number of Test 15   ? Control Negative   ? [place] Negative   ? Johnson Negative   ? 7 Grass 3+   ? Ragweed mix Negative   ? Weed mix Negative   ? Tree mix Negative   ? Mold 1 Negative   ? Mold 2 Negative   ? Mold 3 Negative   ? Mold 4 Negative   ? Cat Negative   ? Dog Negative   ? Cockroach Negative   ? Mite mix Negative   ? ?  ?  ? ?  ? ? ?Reducing Pollen Exposure ? ?The American  Academy of Allergy, Asthma and Immunology suggests the following steps to reduce your exposure to pollen during allergy seasons. ?   ?Do not hang sheets or clothing out to dry; pollen may collect on these items. ?Do not mow lawns or spend time around freshly cut grass; mowing stirs up pollen. ?Keep windows closed at night.  Keep car windows closed while driving. ?Minimize morning activities outdoors, a time when pollen counts are usually at their highest. ?Stay indoors as much as possible when pollen  counts or humidity is high and on windy days when pollen tends to remain in the air longer. ?Use air conditioning when possible.  Many air conditioners have filters that trap the pollen spores. ?Use a HEPA room air filter to remove pollen form the indoor air you breathe. ? ? ? ? ? ? ? ? ?

## 2021-09-18 NOTE — Progress Notes (Signed)
NEW PATIENT  Date of Service/Encounter:  09/18/21  Consult requested by: Myrlene Broker, MD   Assessment:   SOB (shortness of breath) - possibly asthma, but starting a controller medication to see if this helps at all  Seasonal allergic rhinitis due to pollen (grasses)  Gastroesophageal reflux disease  Plan/Recommendations:    1. SOB (shortness of breath) - Lung testing looked normal and it improved ever so slightly with the albuterol treatment (only 1% improvement in the FEV1, but you had 12% improvement in the middle airways).  - This certainly does not prove asthma, but it can point towards it. - We were going to start Symbicort which contains a long albuterol combined with an inhaled steroid. - Spacer sample and demonstration provided. - Daily controller medication(s): Symbicort 80/4.32mcg two puffs twice daily with spacer - Prior to physical activity: albuterol 2 puffs 10-15 minutes before physical activity. - Rescue medications: albuterol 4 puffs every 4-6 hours as needed - Asthma control goals:  * Full participation in all desired activities (may need albuterol before activity) * Albuterol use two time or less a week on average (not counting use with activity) * Cough interfering with sleep two time or less a month * Oral steroids no more than once a year * No hospitalizations  2. Seasonal allergic rhinitis due to pollen - Testing today showed: grasses. - Copy of test results provided.  - Avoidance measures provided. - Continue with: Zyrtec (cetirizine) 10mg  tablet once daily - Start taking: Singulair (montelukast) 10mg  daily - Montelukast has been associated with increased irritability and bad dreams, so they might want to wait and start that after the festivities this coming weekend, just in case you do have an adverse reaction to it.  3. Gastroesophageal reflux disease - Start Pepcid (famotidine) 40 mg daily. - This one (unlike omeprazole) is not  associated with bone demineralization when used daily.  4. Return in about 4 weeks (around 10/16/2021).    This note in its entirety was forwarded to the Provider who requested this consultation.  Subjective:   Renee [name] is a 61 y.o. female presenting today for evaluation of  Chief Complaint  Patient presents with   Shortness of Breath    Lungs feel super tight. Small aneurism and scarring on top of lungs. CT with and without contrast was done recently.    Allergy Testing    Renee [name] has a history of the following: Patient Active Problem List   Diagnosis Date Noted   SOB (shortness of breath) 07/26/2021   Obstructive sleep apnea hypopnea, mild 05/17/2021   Partial tear of right rotator cuff 02/17/2020   Family history of thyroid cancer 12/19/2017   Gastroesophageal reflux disease 04/22/2016   Routine general medical examination at a health care facility 03/16/2015   Osteopenia 04/06/2012   Depression 11/11/2011   Trigger finger of both hands    IBS (irritable bowel syndrome)     History obtained from: chart review and patient.  Renee [name] was referred by Myrlene Broker, MD.     Renee [name] is a 61 y.o. female presenting for an evaluation of SOB as well as allergic rhinitis . She lived down here 11 years ago. Her parents are from [place] originally.    Asthma/Respiratory Symptom History: She has a history of SOB. She had a CT that showed scarring at the bases and a small aortic aneurysm. She has had SOB for 30 years which she attributed to her reflex and ensuing inflammation.  She has seen Pulmonology in [place] and then her PCP here was sent to get a breathing test which was normal. They are going to get another check in one year. She has been given albuterol in the past when she lived in [place]. She has never had an improvement symptomatically or otherwise to albuterol. She did have smoking exposure initially when she started working at [employer]. She has not required  prednisone and has not been to the hospital for these symptoms. She had a CXR years ago that showed "enlarged lungs".   She did have a heart cath at one point when she lived in [place]. This was 20 years ago. Her husband was in the middle of alcoholism which likely triggered her symptoms. She did not have an aneurysm at that time.   It does not seem that she has ever been on a daily controller medication.  She has never been on an inhaled steroid nor a combined ICS/LABA.  Allergic Rhinitis Symptom History: She has allergic rhinitis symptoms for years. She stopped antihistamines four days ago and has had more sneezing and rhinorrhea. Symptoms are year round.   She has changed her diet and she was placed on a cholesterol medication. Cholesterol has been creeping up over the years.   Otherwise, there is no history of other atopic diseases, including food allergies, drug allergies, stinging insect allergies, eczema, urticaria, or contact dermatitis. There is no significant infectious history. Vaccinations are up to date.    Past Medical History: Patient Active Problem List   Diagnosis Date Noted   SOB (shortness of breath) 07/26/2021   Obstructive sleep apnea hypopnea, mild 05/17/2021   Partial tear of right rotator cuff 02/17/2020   Family history of thyroid cancer 12/19/2017   Gastroesophageal reflux disease 04/22/2016   Routine general medical examination at a health care facility 03/16/2015   Osteopenia 04/06/2012   Depression 11/11/2011   Trigger finger of both hands    IBS (irritable bowel syndrome)     Medication List:  Allergies as of 09/18/2021       Reactions   Codeine Other (See Comments)   Hot flashes/passed out   Pollen Extract Other (See Comments)   drainage        Medication List        Accurate as of September 18, 2021 12:10 PM. If you have any questions, ask your nurse or doctor.          STOP taking these medications    metoprolol tartrate 100 MG  tablet Commonly known as: LOPRESSOR Stopped by: Alfonse Spruce, MD       TAKE these medications    albuterol 108 (90 Base) MCG/ACT inhaler Commonly known as: VENTOLIN HFA Inhale 2 puffs into the lungs every 6 (six) hours as needed for wheezing or shortness of breath. Started by: Alfonse Spruce, MD   aspirin EC 81 MG tablet Take 1 tablet (81 mg total) by mouth daily. Swallow whole.   budesonide-formoterol 80-4.5 MCG/ACT inhaler Commonly known as: Symbicort Inhale 2 puffs into the lungs in the morning and at bedtime. Started by: Alfonse Spruce, MD   CALCIUM PO Take 20 mg by mouth daily.   cetirizine 10 MG tablet Commonly known as: ZYRTEC Take 10 mg by mouth daily.   DIGESTIVE ENZYMES PO Take 1 tablet by mouth every other day.   fluticasone 50 MCG/ACT nasal spray Commonly known as: FLONASE SPRAY 2 SPRAYS INTO EACH NOSTRIL EVERY DAY   LEVOCETIRIZINE DIHYDROCHLORIDE PO  Take 5 mg by mouth daily.   metroNIDAZOLE 0.75 % cream Commonly known as: METROCREAM Apply 1 application topically 2 (two) times daily.   montelukast 10 MG tablet Commonly known as: Singulair Take 1 tablet (10 mg total) by mouth at bedtime. Started by: Alfonse Spruce, MD   multivitamin with minerals Tabs tablet Take 1 tablet by mouth daily.   omeprazole 20 MG capsule Commonly known as: PRILOSEC Take 20 mg by mouth daily as needed.   PROBIOTIC DAILY PO Take 1 tablet by mouth daily.   rosuvastatin 20 MG tablet Commonly known as: CRESTOR Take 1 tablet (20 mg total) by mouth daily.   sertraline 50 MG tablet Commonly known as: ZOLOFT Take 2.5 tablets (125 mg total) by mouth daily.   simethicone 80 MG chewable tablet Commonly known as: MYLICON Chew 80 mg by mouth every 6 (six) hours as needed for flatulence.        Birth History: non-contributory  Developmental History: non-contributory  Past Surgical History: Past Surgical History:  Procedure Laterality Date    APPENDECTOMY     BUNIONECTOMY WITH HAMMERTOE RECONSTRUCTION Left 07/06/13   [place]   CARDIAC CATHETERIZATION  2006   normal study   CARPAL TUNNEL RELEASE  2011   right hand   HYSTEROPLASTY REPAIR OF UTERINE ANOMALY     2003 - laproscopically; 2006 - laparotomy   LAPAROSCOPIC ENDOMETRIOSIS FULGURATION     1996 and 2002   MANDIBLE RECONSTRUCTION     NASAL SEPTUM SURGERY  11/2012   [facility] FINGER RELEASE  2011   R thimb and ring finger   TRIGGER FINGER RELEASE  02/04/2012   [place], in office     Family History: Family History  Problem Relation Age of Onset   Allergic rhinitis Mother    Arthritis Mother        DOB 71   Cancer Mother        Thyroid cancer   Hypertension Mother    Atrial fibrillation Mother    Melanoma Mother    Breast cancer Mother    Arthritis Father        DOB 1929   Melanoma Father    Allergic rhinitis Sister    Colon polyps Neg Hx    Rectal cancer Neg Hx    Stomach cancer Neg Hx      Social History: Gitel lives at home with her husband.  She has a house that is 52 years old.  There is hardwood flooring throughout the home.  She has a heat pump for heating and cooling.  There are 2 dogs in the home.  There are no dust mite covers on the pillows, but she does have 1 on the bed.  There is no tobacco exposure.  She is retired from [employer].  She is exposed to dust occasionally from her husband to have a workshop in the garage.  They do not live near an interstate or industrial area.  There is no filter use. She is helping her sister take care of her parents. Her mother has dementia and her father is in good health. She is going to be hosting a wedding at her house this weekend. Needless to say, she is a bit stressed.    Review of Systems  Constitutional: Negative.  Negative for chills, fever, malaise/fatigue and weight loss.  HENT: Negative.  Negative for congestion, ear discharge and ear pain.   Eyes:  Negative for pain, discharge and redness.   Respiratory:  Positive for shortness of breath. Negative for cough, sputum production and wheezing.   Cardiovascular: Negative.  Negative for chest pain and palpitations.  Gastrointestinal:  Negative for abdominal pain, constipation, diarrhea, heartburn, nausea and vomiting.  Skin: Negative.  Negative for itching and rash.  Neurological:  Negative for dizziness and headaches.  Endo/Heme/Allergies:  Negative for environmental allergies. Does not bruise/bleed easily.      Objective:   Blood pressure 104/68, pulse 95, temperature 97.9 F (36.6 C), temperature source Temporal, resp. rate 20, height 5' 7.32" (1.71 m), weight 138 lb 6.4 oz (62.8 kg), SpO2 96 %. Body mass index is 21.47 kg/m.     Physical Exam Vitals reviewed.  Constitutional:      Appearance: She is well-developed.     Comments: Very pleasant and talkative.  HENT:     Head: Normocephalic and atraumatic.     Right Ear: Tympanic membrane, ear canal and external ear normal. No drainage, swelling or tenderness. Tympanic membrane is not injected, scarred, erythematous, retracted or bulging.     Left Ear: Tympanic membrane, ear canal and external ear normal. No drainage, swelling or tenderness. Tympanic membrane is not injected, scarred, erythematous, retracted or bulging.     Nose: No nasal deformity, septal deviation, mucosal edema or rhinorrhea.     Right Turbinates: Enlarged, swollen and pale.     Left Turbinates: Enlarged, swollen and pale.     Right Sinus: No maxillary sinus tenderness or frontal sinus tenderness.     Left Sinus: No maxillary sinus tenderness or frontal sinus tenderness.     Mouth/Throat:     Mouth: Mucous membranes are not pale and not dry.     Pharynx: Uvula midline.  Eyes:     General:        Right eye: No discharge.        Left eye: No discharge.     Conjunctiva/sclera: Conjunctivae normal.     Right eye: Right conjunctiva is not injected. No chemosis.    Left eye: Left conjunctiva is not  injected. No chemosis.    Pupils: Pupils are equal, round, and reactive to light.  Cardiovascular:     Rate and Rhythm: Normal rate and regular rhythm.     Heart sounds: Normal heart sounds.  Pulmonary:     Effort: Pulmonary effort is normal. No tachypnea, accessory muscle usage or respiratory distress.     Breath sounds: Normal breath sounds. No wheezing, rhonchi or rales.     Comments: Moving air well in all lung fields.  No increased work of breathing.  Crackles. Chest:     Chest wall: No tenderness.  Abdominal:     Tenderness: There is no abdominal tenderness. There is no guarding or rebound.  Lymphadenopathy:     Head:     Right side of head: No submandibular, tonsillar or occipital adenopathy.     Left side of head: No submandibular, tonsillar or occipital adenopathy.     Cervical: No cervical adenopathy.  Skin:    Coloration: Skin is not pale.     Findings: No abrasion, erythema, petechiae or rash. Rash is not papular, urticarial or vesicular.  Neurological:     Mental Status: She is alert.     Diagnostic studies:    Spirometry: results normal (FEV1: )-2.45/86%, FVC: 3.18/87%, FEV1/FVC: 77%).    Spirometry consistent with normal pattern. Xopenex four puffs via MDI treatment given in clinic with improvement in FEV1, but not significant per ATS criteria.  She did have  a 12% improvement in her FEF 25-75%.  Allergy Studies:     Airborne Adult Perc - 09/18/21 1000     Time Antigen Placed 1027    Allergen Manufacturer Waynette Buttery    Location Back    Number of Test 59    1. Control-Buffer 50% Glycerol Negative    2. Control-Histamine 1 mg/ml 3+    3. Albumin saline Negative    4. Bahia Negative    5. French Southern Territories Negative    6. Johnson 2+    7. [place] Blue Negative    8. Meadow Fescue Negative    9. Perennial Rye Negative    10. Sweet Vernal Negative    11. Timothy Negative    12. Cocklebur Negative    13. Burweed Marshelder Negative    14. Ragweed, short Negative    15.  Ragweed, Giant Negative    16. Plantain,  English Negative    17. Lamb's Quarters Negative    18. Sheep Sorrell Negative    19. Rough Pigweed Negative    20. Marsh Elder, Rough Negative    21. Mugwort, Common Negative    22. Ash mix Negative    23. Birch mix Negative    24. Beech American Negative    25. Box, Elder Negative    26. Cedar, red Negative    27. Cottonwood, Guinea-Bissau Negative    28. Elm mix Negative    29. Hickory Negative    30. Maple mix Negative    31. Oak, Guinea-[place] mix Negative    32. Pecan Pollen Negative    33. Pine mix Negative    34. Sycamore Eastern Negative    35. Walnut, Black Pollen Negative    36. Alternaria alternata Negative    37. Cladosporium Herbarum Negative    38. Aspergillus mix Negative    39. Penicillium mix Negative    40. Bipolaris sorokiniana (Helminthosporium) Negative    41. Drechslera spicifera (Curvularia) Negative    42. Mucor plumbeus Negative    43. Fusarium moniliforme Negative    44. Aureobasidium pullulans (pullulara) Negative    45. Rhizopus oryzae Negative    46. Botrytis cinera Negative    47. Epicoccum nigrum Negative    48. Phoma betae Negative    49. Candida Albicans Negative    50. Trichophyton mentagrophytes Negative    51. Mite, D Farinae  5,000 AU/ml Negative    52. Mite, D Pteronyssinus  5,000 AU/ml Negative    53. Cat Hair 10,000 BAU/ml Negative    54.  Dog Epithelia Negative    55. Mixed Feathers Negative    56. Horse Epithelia Negative    57. Cockroach, German Negative    58. Mouse Negative    59. Tobacco Leaf Negative             Intradermal - 09/18/21 1102     Time Antigen Placed 1102    Allergen Manufacturer Greer    Location Arm    Number of Test 15    Control Negative    French Southern Territories Negative    Johnson Negative    7 Grass 3+    Ragweed mix Negative    Weed mix Negative    Tree mix Negative    Mold 1 Negative    Mold 2 Negative    Mold 3 Negative    Mold 4 Negative    Cat Negative    Dog  Negative    Cockroach Negative    Mite mix Negative  Allergy testing results were read and interpreted by myself, documented by clinical staff.         Malachi Bonds, MD [facility]

## 2021-09-18 NOTE — Telephone Encounter (Signed)
Please advise 

## 2021-09-19 NOTE — Telephone Encounter (Signed)
RN spoke with patient and she states she is feeling better about her test results and says that if she comes up with any questions she will call us. No appointment at this time.  ? ?

## 2021-10-04 NOTE — Telephone Encounter (Signed)
Please advise 

## 2021-10-07 IMAGING — US US THYROID
1 series · 13 of 25 positions shown · non-contrast
Comparison: 01/07/2018; 01/11/2013

CLINICAL DATA: Other.  Family history of thyroid cancer

EXAM:
THYROID ULTRASOUND
TECHNIQUE: Ultrasound examination of the thyroid gland and adjacent soft
tissues was performed.

[Series 1: us thyroid · 0.05mm/px · 13 of 39 slices shown]
[im 1/39]
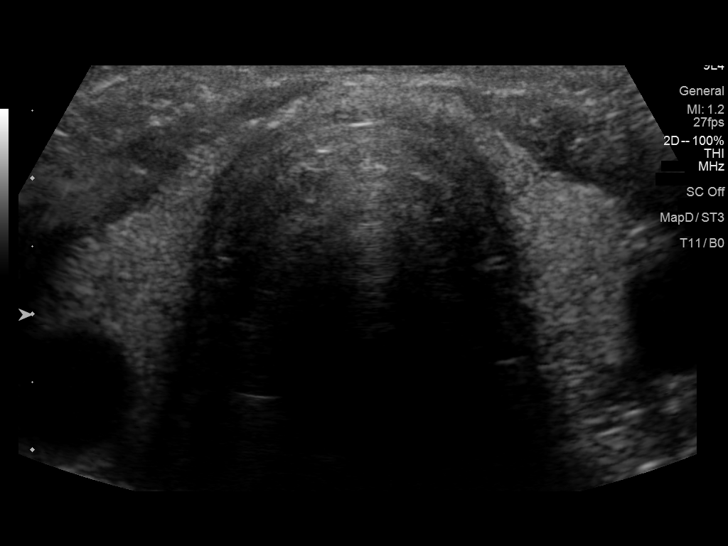
[im 4/39]
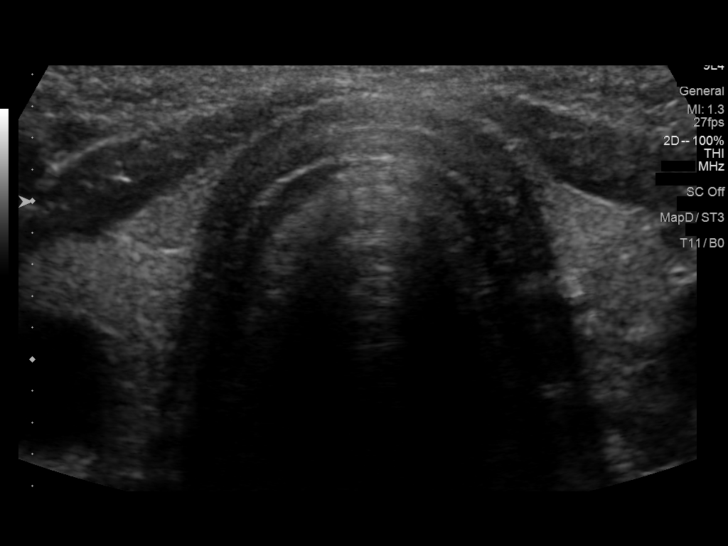
[im 7/39]
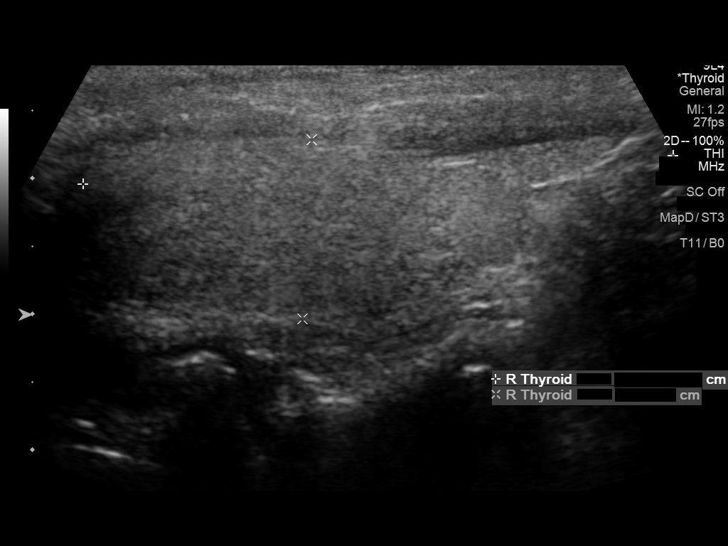
[im 10/39]
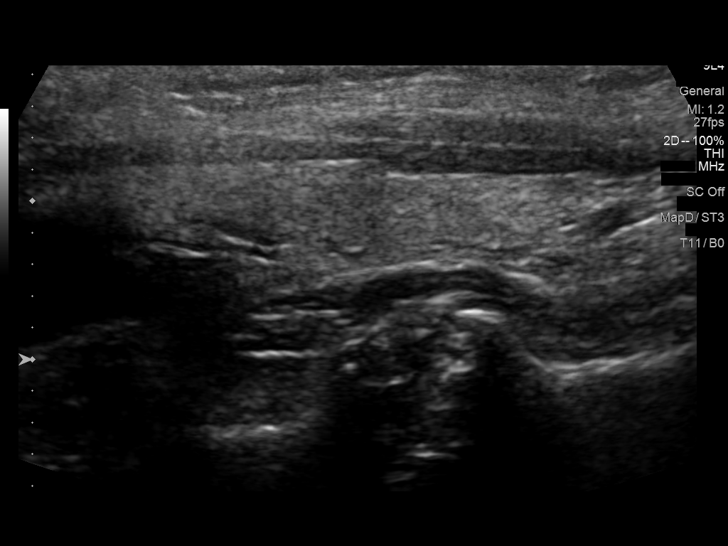
[im 13/39]
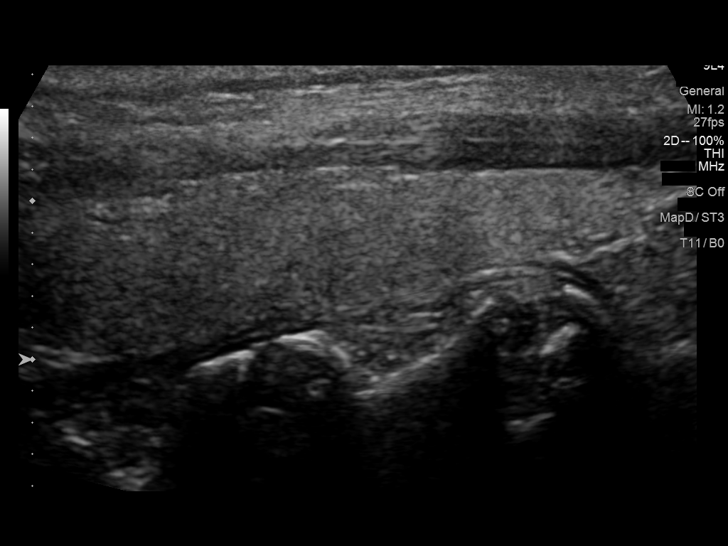
[im 16/39]
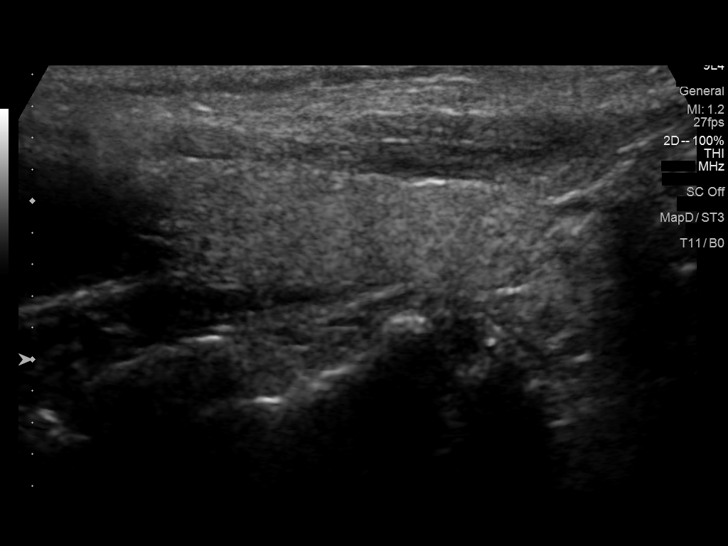
[im 20/39]
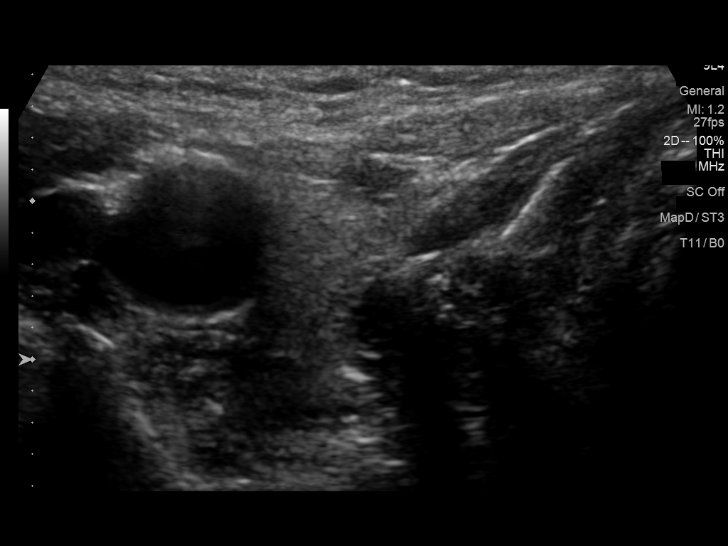
[im 23/39]
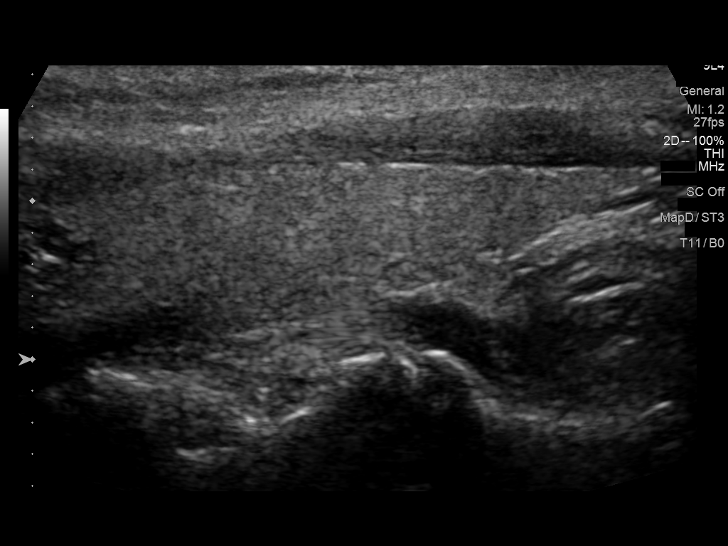
[im 26/39]
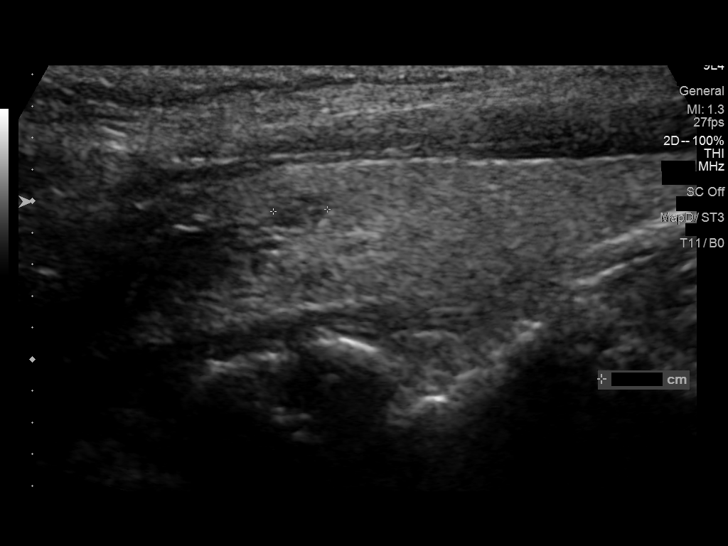
[im 29/39]
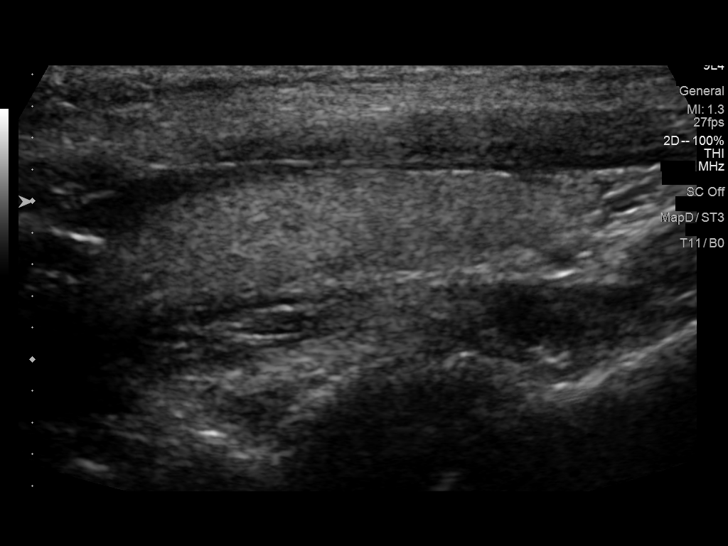
[im 32/39]
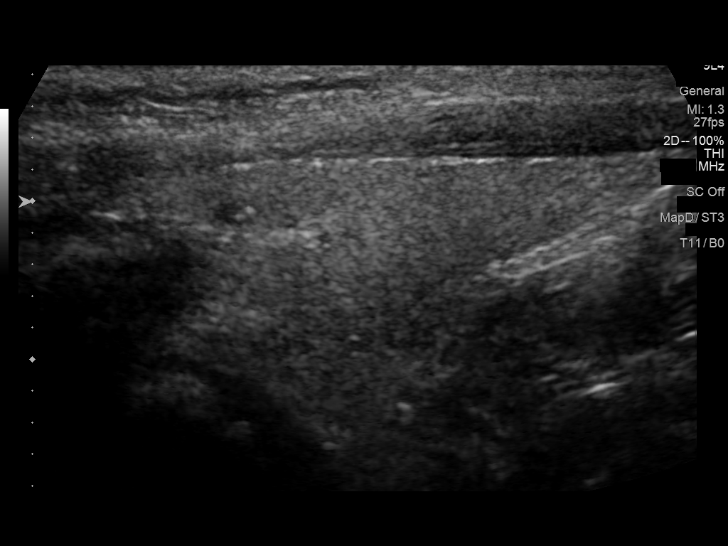
[im 35/39]
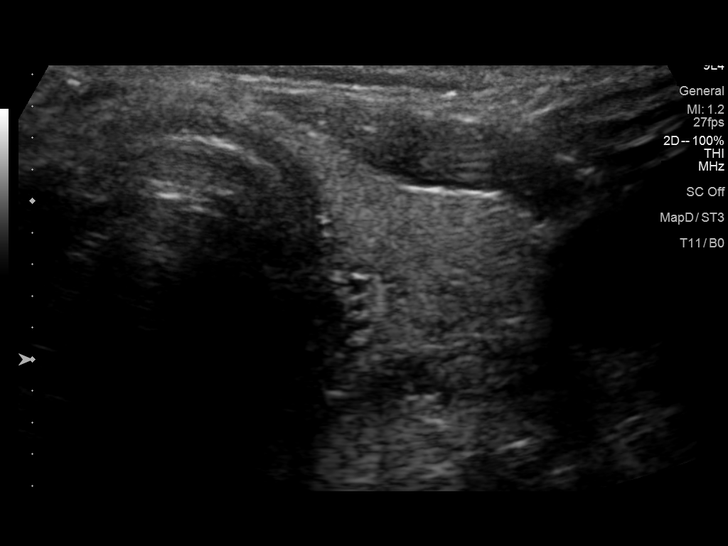
[im 39/39]
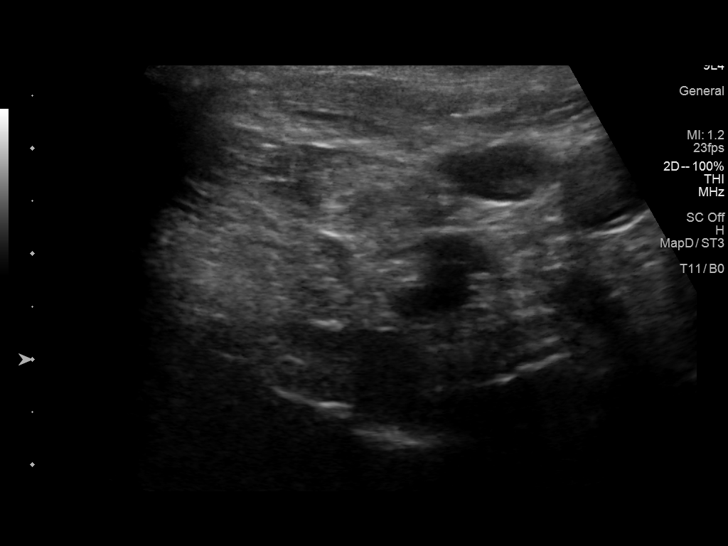

[13 of 25 positions shown; findings below may reference images not displayed]

FINDINGS: Parenchymal Echotexture: Normal

Isthmus: Normal in size measures 0.2 cm in diameter

Right lobe: Normal in size measuring 4.4 x 1.3 x 1.3 cm previously,
4.4 x 0.8 x 1.3 cm

Left lobe: Normal in size measuring 3.7 x 1.0 x 1.4 cm, previously,
3.4 x 0.8 x 1.2 cm

_________________________________________________________

Estimated total number of nodules >/= 1 cm: 0

Number of spongiform nodules >/=  2 cm not described below (TR1): 0

Number of mixed cystic and solid nodules >/= 1.5 cm not described
below (TR2): 0

_________________________________________________________

Solitary punctate (approximately 0.3 cm) hypoechoic nodule/cyst
within the superior pole the left lobe of the thyroid is unchanged
compared to the 7654 examination and again does not meet criteria to
recommend percutaneous sampling or continued dedicated follow-up.

No regional cervical lymphadenopathy.
IMPRESSION: Similar appearing thyroid without worrisome nodule or mass.

The above is in keeping with the ACR TI-RADS recommendations - [HOSPITAL] 0292;[DATE].

## 2021-10-15 ENCOUNTER — Encounter (HOSPITAL_BASED_OUTPATIENT_CLINIC_OR_DEPARTMENT_OTHER): Payer: Self-pay

## 2021-10-18 ENCOUNTER — Encounter: Payer: Self-pay | Admitting: Allergy & Immunology

## 2021-10-18 ENCOUNTER — Ambulatory Visit: Payer: Federal, State, Local not specified - PPO | Admitting: Allergy & Immunology

## 2021-10-18 VITALS — BP 124/70 | HR 73 | Temp 98.0°F | Resp 16 | Ht 67.5 in | Wt 133.8 lb

## 2021-10-18 DIAGNOSIS — K219 Gastro-esophageal reflux disease without esophagitis: Secondary | ICD-10-CM | POA: Diagnosis not present

## 2021-10-18 DIAGNOSIS — J301 Allergic rhinitis due to pollen: Secondary | ICD-10-CM

## 2021-10-18 DIAGNOSIS — R0602 Shortness of breath: Secondary | ICD-10-CM

## 2021-10-18 NOTE — Patient Instructions (Addendum)
1. SOB (shortness of breath) ?- Breathing test looks stable today.  ?- I am still not sure that this is asthma, but the Symbicort seems to be helping.  ?- This is likely a multifactorial process, but it is NOT COPD!  ?- Daily controller medication(s): Symbicort 80/4.68mg two puffs twice daily with spacer AS NEEDED ?- Prior to physical activity: albuterol 2 puffs 10-15 minutes before physical activity. ?- Rescue medications: albuterol 4 puffs every 4-6 hours as needed ?- Asthma control goals:  ?* Full participation in all desired activities (may need albuterol before activity) ?* Albuterol use two time or less a week on average (not counting use with activity) ?* Cough interfering with sleep two time or less a month ?* Oral steroids no more than once a year ?* No hospitalizations ? ?2. Seasonal allergic rhinitis due to pollen (grasses) ?- It seems that you have everything under control.  ?- Continue with: Xyzal (levocetirizine) '5mg'$  tablet once daily (alternate every 3 months wit Allegra and Zyrtec) ?- Continue with: Singulair (montelukast) '10mg'$  daily ? ?3. Gastroesophageal reflux disease ?- Continue with dietary changes.  ? ?4. Return in about 6 months (around 04/19/2022).  ? ? ?Please inform [place] any Emergency Department visits, hospitalizations, or changes in symptoms. Call [place]efore going to the ED for breathing or allergy symptoms since we might be able to fit you in for a sick visit. Feel free to contact u[place]anytime with any questions, problems, or concerns. ? ?It was a pleasure to see you today! ? ?Websites that have reliable patient information: ?1. American Academy of Asthma, Allergy, and Immunology: www.aaaai.org ?2. Food Allergy Research and Education (FARE): foodallergy.org ?3. Mothers of Asthmatics: http://www.asthmacommunitynetwork.org ?4. [employer]of Allergy, Asthma, and Immunology: wMonthlyElectricBill.co.uk? ? ?COVID-19 Vaccine Information can be found at:  hShippingScam.[email or web address].ukFor questions related to vaccine distribution or appointments, please email [email or web address] or call [phone]  ? ?We realize that you might be concerned about having an allergic reaction to the COVID19 vaccines. To help with that concern, WE ARE OFFERING THE COVID19 VACCINES IN OUR OFFICE! Ask the front desk for dates!  ? ? ? ??Like? u[place]on Facebook and Instagram for our latest updates!  ?  ? ? ?A healthy democracy works best when ANew York Life Insuranceparticipate! Make sure you are registered to vote! If you have moved or changed any of your contact information, you will need to get this updated before voting! ? ?In some cases, you MAY be able to register to vote online: [email or web address]? ? ? ? ? ? ? ?

## 2021-10-18 NOTE — Progress Notes (Signed)
? ?FOLLOW UP ? ?Date of Service/Encounter:  10/18/21 ? ? ?Assessment:  ? ?SOB (shortness of breath) - possibly asthma, but starting a controller medication to see if this helps at all ?  ?Seasonal allergic rhinitis due to pollen (grasses) ?  ?Gastroesophageal reflux disease ? ? ?I think that we are headed in the right direction with regards to her SOB. Like many things in life, this is likely multifactorial and she has made changes to address the postnasal drip as well as the GERD and any underlying asthma. Whatever the case, her symptoms have stabilized and she feels that she is doing much better than before I saw her. We are not going to make any changes at this point in time.  ? ?Plan/Recommendations:  ? ?1. SOB (shortness of breath) ?- Breathing test looks stable today.  ?- I am still not sure that this is asthma, but the Symbicort seems to be helping.  ?- This is likely a multifactorial process, but it is NOT COPD!  ?- Daily controller medication(s): Symbicort 80/4.28mg two puffs twice daily with spacer AS NEEDED ?- Prior to physical activity: albuterol 2 puffs 10-15 minutes before physical activity. ?- Rescue medications: albuterol 4 puffs every 4-6 hours as needed ?- Asthma control goals:  ?* Full participation in all desired activities (may need albuterol before activity) ?* Albuterol use two time or less a week on average (not counting use with activity) ?* Cough interfering with sleep two time or less a month ?* Oral steroids no more than once a year ?* No hospitalizations ? ?2. Seasonal allergic rhinitis due to pollen (grasses) ?- It seems that you have everything under control.  ?- Continue with: Xyzal (levocetirizine) '5mg'$  tablet once daily (alternate every 3 months wit Allegra and Zyrtec) ?- Continue with: Singulair (montelukast) '10mg'$  daily ? ?3. Gastroesophageal reflux disease ?- Continue with dietary changes.  ? ?4. Return in about 6 months (around 04/19/2022).  ? ?Subjective:  ? ?Renee Spillmanis a  61y.o. female presenting today for follow up of  ?Chief Complaint  ?Patient presents with  ? Follow-up  ?  Symbicort Inhaler gives her the gitters. So she is not using it every day just when she feels like she needs it. 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   ? ? ?Renee [name] a history of the following: ?Patient Active Problem List  ? Diagnosis Date Noted  ? SOB (shortness of breath) 07/26/2021  ? Obstructive sleep apnea hypopnea, mild 05/17/2021  ? Partial tear of right rotator cuff 02/17/2020  ? Family history of thyroid cancer 12/19/2017  ? Gastroesophageal reflux disease 04/22/2016  ? Routine general medical examination at a health care facility 03/16/2015  ? Osteopenia 04/06/2012  ? Depression 11/11/2011  ? Trigger finger of both hands   ? IBS (irritable bowel syndrome)   ? ? ?History obtained from: chart review and patient. ? ?[name] a 61y.o. female presenting for a follow up visit.  She was last seen in March 2023.  At that time, lung testing looked normal but did improve ever so slightly with the albuterol treatment.  We decided to start Symbicort 2 puffs twice daily to see if this provided any relief of her symptoms.  We did testing that was positive only to grasses.  We continue with Zyrtec and started Singulair 10 mg daily.  For her reflux, we started Pepcid 40 mg daily. ? ?Since the last visit, she has mostly done well. The wedding went off without a hitch. This is now off of her plate. She feels that her anxiety improved after that. There were 15 people in attendance. ? ?Asthma/Respiratory Symptom History: She was using the Symbicort regularlyfor a few days but she wa shaving some SOB. She was getting jittery.  Her cardiology PA felt thtat this was fine to use. She wanted to see what I thought of the matter. Because of the concern for increasing blood  pressure, she has limited it to as needed. She estimates that she is using it around 2-3 times per week or maybe a bit more.  ? ?Allergic Rhinitis Symptom History: She has never seen ENT  in the past. She is using the montelukast for her allergies which seems to be helping with the postnasal drip. She has not needed antibiotics at all for her symptoms. She has not been on prednisone.  ? ?GERD Symptom History: She did change her diet for the cholesterol. She is avoiding high fats and dairy. Reflux is not active.  Endoscopies in the past have been normal. This hsa been 4-5 years since she saw a gastro doctor.  ? ?Otherwise, there have been no changes to her past medical history, surgical history, family history, or social history. ? ? ? ?Review of Systems  ?Constitutional: Negative.  Negative for chills, fever, malaise/fatigue and weight loss.  ?HENT: Negative.  Negative for congestion, ear discharge and ear pain.   ?Eyes:  Negative for pain, discharge and redness.  ?Respiratory:  Negative for cough, sputum production, shortness of breath and wheezing.   ?Cardiovascular: Negative.  Negative for chest pain and palpitations.  ?Gastrointestinal:  Negative for abdominal pain, constipation, diarrhea, heartburn, nausea and vomiting.  ?Skin: Negative.  Negative for itching and rash.  ?Neurological:  Negative for dizziness and headaches.  ?Endo/Heme/Allergies:  Negative for environmental allergies. Does not bruise/bleed easily.   ? ? ? ?Objective:  ? ?Blood pressure 124/70, pulse 73, temperature 98 ?F (36.7 ?C), temperature source Temporal, resp. rate 16, height 5' 7.5" (1.715 m), weight 133 lb 12.8 oz (60.7 kg), SpO2 98 %. ?Body mass index is 20.65 kg/m?. ? ? ?Physical Exam ?Vitals reviewed.  ?Constitutional:   ?   Appearance: She is well-developed.  ?   Comments: Very pleasant and talkative.  ?HENT:  ?   Head: Normocephalic and atraumatic.  ?   Right Ear: Tympanic membrane, ear canal and external ear normal. No drainage,  swelling or tenderness. Tympanic membrane is not injected, scarred, erythematous, retracted or bulging.  ?   Left Ear: Tympanic membrane, ear canal and external ear normal. No drainage, swelling or tenderness. Tympanic membrane is not injected, scarred, erythematous, retracted or bulging.  ?   Nose: No nasal deformity, septal deviation, mucosal edema or rhinorrhea.  ?   Right Turbinates: Enlarged, swollen and pale.  ?   Left Turbinates: Enlarged, swollen and pale.   ?   Right Sinus: No maxillary sinus tenderness or frontal sinus tenderness.  ?   Left Sinus: No maxillary sinus tenderness or frontal sinus tenderness.  ?   No polyps noted.  ?   Mouth/Throat:  ?   Mouth: Mucous membranes are not pale and not dry.  ?   Pharynx: Uvula midline.  ?Eyes:  ?   General:     ?   Right eye: No discharge.     ?  Left eye: No discharge.  ?   Conjunctiva/sclera: Conjunctivae normal.  ?   Right eye: Right conjunctiva is not injected. No chemosis. ?   Left eye: Left conjunctiva is not injected. No chemosis. ?   Pupils: Pupils are equal, round, and reactive to light.  ?Cardiovascular:  ?   Rate and Rhythm: Normal rate and regular rhythm.  ?   Heart sounds: Normal heart sounds.  ?Pulmonary:  ?   Effort: Pulmonary effort is normal. No tachypnea, accessory muscle usage or respiratory distress.  ?   Breath sounds: Normal breath sounds. No wheezing, rhonchi or rales.  ?   Comments: Moving air well in all lung fields.  No increased work of breathing.  Crackles. ?Chest:  ?   Chest wall: No tenderness.  ?Abdominal:  ?   Tenderness: There is no abdominal tenderness. There is no guarding or rebound.  ?Lymphadenopathy:  ?   Head:  ?   Right side of head: No submandibular, tonsillar or occipital adenopathy.  ?   Left side of head: No submandibular, tonsillar or occipital adenopathy.  ?   Cervical: No cervical adenopathy.  ?Skin: ?   Coloration: Skin is not pale.  ?   Findings: No abrasion, erythema, petechiae or rash. Rash is not papular,  urticarial or vesicular.  ?Neurological:  ?   Mental Status: She is alert.  ? ?  ? ?Diagnostic studies:   ? ?Spirometry: results normal (FEV1: 2.36/86%, FVC: 3.07/88%, FEV1/FVC: 77%).  ?  ?Spirometry consistent wi

## 2021-11-02 DIAGNOSIS — H52203 Unspecified astigmatism, bilateral: Secondary | ICD-10-CM | POA: Diagnosis not present

## 2021-11-02 DIAGNOSIS — Z961 Presence of intraocular lens: Secondary | ICD-10-CM | POA: Diagnosis not present

## 2021-11-02 DIAGNOSIS — H1789 Other corneal scars and opacities: Secondary | ICD-10-CM | POA: Diagnosis not present

## 2021-11-08 DIAGNOSIS — I25118 Atherosclerotic heart disease of native coronary artery with other forms of angina pectoris: Secondary | ICD-10-CM | POA: Diagnosis not present

## 2021-11-09 ENCOUNTER — Telehealth (HOSPITAL_BASED_OUTPATIENT_CLINIC_OR_DEPARTMENT_OTHER): Payer: Self-pay

## 2021-11-09 DIAGNOSIS — E785 Hyperlipidemia, unspecified: Secondary | ICD-10-CM

## 2021-11-09 LAB — COMPREHENSIVE METABOLIC PANEL
ALT: 18 IU/L (ref 0–32)
AST: 23 IU/L (ref 0–40)
Albumin/Globulin Ratio: 2.1 (ref 1.2–2.2)
Albumin: 4.7 g/dL (ref 3.8–4.9)
Alkaline Phosphatase: 78 IU/L (ref 44–121)
BUN/Creatinine Ratio: 16 (ref 12–28)
BUN: 14 mg/dL (ref 8–27)
Bilirubin Total: 0.5 mg/dL (ref 0.0–1.2)
CO2: 21 mmol/L (ref 20–29)
Calcium: 9.5 mg/dL (ref 8.7–10.3)
Chloride: 103 mmol/L (ref 96–106)
Creatinine, Ser: 0.9 mg/dL (ref 0.57–1.00)
Globulin, Total: 2.2 g/dL (ref 1.5–4.5)
Glucose: 91 mg/dL (ref 70–99)
Potassium: 4.2 mmol/L (ref 3.5–5.2)
Sodium: 139 mmol/L (ref 134–144)
Total Protein: 6.9 g/dL (ref 6.0–8.5)
eGFR: 73 mL/min/{1.73_m2} (ref >59)

## 2021-11-09 LAB — LIPID PANEL
Chol/HDL Ratio: 3.2 ratio (ref 0.0–4.4)
Cholesterol, Total: 218 mg/dL — ABNORMAL HIGH (ref 100–199)
HDL: 69 mg/dL (ref >39)
LDL Chol Calc (NIH): 135 mg/dL — ABNORMAL HIGH (ref 0–99)
Triglycerides: 79 mg/dL (ref 0–149)
VLDL Cholesterol Cal: 14 mg/dL (ref 5–40)

## 2021-11-09 MED ORDER — ROSUVASTATIN CALCIUM 40 MG PO TABS: 40.0000 mg | ORAL_TABLET | Freq: Every day | ORAL | 3 refills | Status: DC

## 2021-11-09 NOTE — Telephone Encounter (Addendum)
Results called to patient who verbalizes understanding!  ? ?Labs ordered and mailed, prescriptions updated and sent to pharmacy on file  ? ? ? ?----- Message from Deberah Pelton, NP sent at 11/09/2021  7:02 AM EDT ----- ?Please contact Renee [name] and let her know that her lab work is been reviewed.  Her total cholesterol is 218 and her LDL cholesterol is elevated at 135.  We will increase her rosuvastatin to 40 mg daily and repeat her fasting lipids and LFTs in 6-8 weeks.  Please remind her to continue to eat a heart healthy high-fiber diet.  Please ask her to increase her physical activity as tolerated.  Thank you. ?

## 2021-11-15 ENCOUNTER — Ambulatory Visit: Payer: Federal, State, Local not specified - PPO | Admitting: Family Medicine

## 2022-01-09 NOTE — Progress Notes (Signed)
HPI: FU palpitations and coronary artery disease. Echocardiogram November 2021 showed normal LV function, trace mitral regurgitation.  CTA February 2023 showed 4.1 cm thoracic aortic aneurysm and scattered nodules and follow-up recommended in 12 months.  Cardiac CTA March 2023 showed ascending aorta of 3.9 cm and unchanged 3 mm nodule in the left lung base; calcium score 146 which was 92nd percentile; moderate 50 to 69% stenosis in the proximal LAD.  Since last seen she has some dyspnea on exertion unchanged.  No orthopnea, PND, pedal edema, chest pain or syncope.  Current Outpatient Medications  Medication Sig Dispense Refill   albuterol (VENTOLIN HFA) 108 (90 Base) MCG/ACT inhaler Inhale 2 puffs into the lungs every 6 (six) hours as needed for wheezing or shortness of breath. 8 g 2   aspirin EC 81 MG tablet Take 1 tablet (81 mg total) by mouth daily. Swallow whole. 90 tablet 3   budesonide-formoterol (SYMBICORT) 80-4.5 MCG/ACT inhaler Inhale 2 puffs into the lungs in the morning and at bedtime. 1 each 5   CALCIUM PO Take 20 mg by mouth daily.     cetirizine (ZYRTEC) 10 MG tablet Take 10 mg by mouth daily.     Cholecalciferol (VITAMIN D3) 50 MCG (2000 UT) capsule Take 2,000 Units by mouth daily.     cyanocobalamin 2000 MCG tablet Take 2,000 mcg by mouth daily.     DIGESTIVE ENZYMES PO Take 1 tablet by mouth every other day.     ergocalciferol (VITAMIN D2) 1.25 MG (50000 UT) capsule Take 50,000 Units by mouth once a week.     fluticasone (FLONASE) 50 MCG/ACT nasal spray SPRAY 2 SPRAYS INTO EACH NOSTRIL EVERY DAY 48 mL 3   LEVOCETIRIZINE DIHYDROCHLORIDE PO Take 5 mg by mouth daily.     magnesium oxide (MAG-OX) 400 (240 Mg) MG tablet Take 400 mg by mouth daily.     metroNIDAZOLE (METROCREAM) 0.75 % cream Apply 1 application topically 2 (two) times daily.     montelukast (SINGULAIR) 10 MG tablet Take 1 tablet (10 mg total) by mouth at bedtime. 30 tablet 5   Multiple Vitamin (MULTIVITAMIN  WITH MINERALS) TABS tablet Take 1 tablet by mouth daily.     Omega-3 Fatty Acids (FISH OIL TRIPLE STRENGTH) 1400 MG CAPS Take by mouth.     omeprazole (PRILOSEC) 20 MG capsule Take 20 mg by mouth daily as needed.     Plant Sterols and Stanols (CHOLESTOFF PLUS PO) Take by mouth.     Probiotic Product (PROBIOTIC DAILY PO) Take 1 tablet by mouth daily.     rosuvastatin (CRESTOR) 40 MG tablet Take 1 tablet (40 mg total) by mouth daily. 90 tablet 3   sertraline (ZOLOFT) 50 MG tablet Take 2.5 tablets (125 mg total) by mouth daily. 250 tablet 3   simethicone (MYLICON) 80 MG chewable tablet Chew 80 mg by mouth every 6 (six) hours as needed for flatulence.     Tart Cherry 1200 MG CAPS Take by mouth.     vitamin E 180 MG (400 UNITS) capsule Take 400 Units by mouth daily.     No current facility-administered medications for this visit.     Past Medical History:  Diagnosis Date   Abnormal chest x-ray    CXR c/w COPD. Full PFTs '11 - normal   Allergy    seasonal   Anxiety    Arthritis    both knees, post traumatic - neck   Cataract    removed bilateral  Depression    with anxiety.   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   Migraines    during teenage years   MVP (mitral valve prolapse)    last 2D echo approx '10   Osteopenia    DEXA 03/2012: -1.0    Past Surgical History:  Procedure Laterality Date   APPENDECTOMY     BUNIONECTOMY WITH HAMMERTOE RECONSTRUCTION Left 07/06/13   [place] CATHETERIZATION  2006   normal study   [place]  2011   right hand   HYSTEROPLASTY REPAIR OF UTERINE ANOMALY     2003 - laproscopically; 2006 - laparotomy   LAPAROSCOPIC ENDOMETRIOSIS FULGURATION     1996 and 2002   MANDIBLE RECONSTRUCTION     NASAL SEPTUM SURGERY  11/2012   [facility] FINGER RELEASE  2011   R thimb and ring finger   TRIGGER FINGER RELEASE   02/04/2012   [place], in office    Social History   Socioeconomic History   Marital status: Married    Spouse name: Not on file   Number of children: 0   Years of education: 16   Highest education level: Not on file  Occupational History   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   Married '94.   Denies any physical or sexual abuse.   Family in : parents and sister.   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   Has smoke alarms, wears seatbelt at all times, uses helmut when appropriate, firearms present in the home, uses herbal remedies, caffeine- 2-3 per day.   No regular exercise.   Social Determinants of Health   Financial Resource Strain: Not on file  Food Insecurity: Not on file  Transportation Needs: Not on file  Physical Activity: Not on file  Stress: Not on file  Social Connections: Not on file  Intimate Partner Violence: Not on file    Family History  Problem Relation Age of Onset   Allergic rhinitis Mother    Arthritis Mother        DOB 53   Cancer Mother        Thyroid cancer   Hypertension Mother    Atrial fibrillation Mother    Melanoma Mother    Breast cancer Mother    Arthritis Father        DOB 83   Melanoma Father    Allergic rhinitis Sister    Colon polyps Neg Hx    Rectal cancer Neg Hx    Stomach cancer Neg Hx     ROS: no fevers or chills, productive cough, hemoptysis, dysphasia, odynophagia, melena, hematochezia, dysuria, hematuria, rash, seizure activity, orthopnea, PND, pedal edema, claudication. Remaining systems are  negative.  Physical Exam: Well-developed well-nourished in no acute distress.  Skin is warm and dry.  HEENT is normal.  Neck  is supple.  Chest is clear to auscultation with normal expansion.  Cardiovascular exam is regular rate and rhythm.  Abdominal exam nontender or distended. No masses palpated. Extremities show no edema. neuro grossly intact  A/P  1 coronary artery disease-patient noted to have moderate disease in the LAD on previous cardiac CTA.  She is not having exertional chest pain.  Plan to continue aspirin and statin.  2 palpitations-symptoms are reasonly well controlled.  Can consider addition of beta-blocker in the future if necessary.  3 hyperlipidemia-continue statin.  Goal LDL less than 50.  4 question history of mitral valve prolapse-not evident on most recent echocardiogram.  5 dilated aorta/pulmonary nodules-plan follow-up CTA March 2024.  Kirk Ruths, MD

## 2022-01-11 DIAGNOSIS — E785 Hyperlipidemia, unspecified: Secondary | ICD-10-CM | POA: Diagnosis not present

## 2022-01-12 LAB — HEPATIC FUNCTION PANEL
ALT: 16 IU/L (ref 0–32)
AST: 25 IU/L (ref 0–40)
Albumin: 4.6 g/dL (ref 3.8–4.9)
Alkaline Phosphatase: 54 IU/L (ref 44–121)
Bilirubin Total: 0.3 mg/dL (ref 0.0–1.2)
Bilirubin, Direct: 0.11 mg/dL (ref 0.00–0.40)
Total Protein: 6.9 g/dL (ref 6.0–8.5)

## 2022-01-12 LAB — LIPID PANEL
Chol/HDL Ratio: 2 ratio (ref 0.0–4.4)
Cholesterol, Total: 128 mg/dL (ref 100–199)
HDL: 63 mg/dL (ref >39)
LDL Chol Calc (NIH): 44 mg/dL (ref 0–99)
Triglycerides: 118 mg/dL (ref 0–149)
VLDL Cholesterol Cal: 21 mg/dL (ref 5–40)

## 2022-01-21 DIAGNOSIS — Z01419 Encounter for gynecological examination (general) (routine) without abnormal findings: Secondary | ICD-10-CM | POA: Diagnosis not present

## 2022-01-21 DIAGNOSIS — Z6820 Body mass index (BMI) 20.0-20.9, adult: Secondary | ICD-10-CM | POA: Diagnosis not present

## 2022-01-21 DIAGNOSIS — Z124 Encounter for screening for malignant neoplasm of cervix: Secondary | ICD-10-CM | POA: Diagnosis not present

## 2022-01-21 DIAGNOSIS — Z1231 Encounter for screening mammogram for malignant neoplasm of breast: Secondary | ICD-10-CM | POA: Diagnosis not present

## 2022-01-22 ENCOUNTER — Encounter: Payer: Self-pay | Admitting: Cardiology

## 2022-01-22 ENCOUNTER — Ambulatory Visit: Payer: Federal, State, Local not specified - PPO | Admitting: Cardiology

## 2022-01-22 VITALS — BP 112/80 | HR 78 | Ht 67.0 in | Wt 130.2 lb

## 2022-01-22 DIAGNOSIS — I7121 Aneurysm of the ascending aorta, without rupture: Secondary | ICD-10-CM | POA: Diagnosis not present

## 2022-01-22 DIAGNOSIS — I25118 Atherosclerotic heart disease of native coronary artery with other forms of angina pectoris: Secondary | ICD-10-CM | POA: Diagnosis not present

## 2022-01-22 DIAGNOSIS — E785 Hyperlipidemia, unspecified: Secondary | ICD-10-CM | POA: Diagnosis not present

## 2022-01-22 NOTE — Patient Instructions (Signed)

## 2022-01-30 ENCOUNTER — Other Ambulatory Visit: Payer: Self-pay | Admitting: Obstetrics and Gynecology

## 2022-01-30 DIAGNOSIS — Z803 Family history of malignant neoplasm of breast: Secondary | ICD-10-CM

## 2022-02-09 ENCOUNTER — Other Ambulatory Visit: Payer: Self-pay | Admitting: Allergy & Immunology

## 2022-03-18 ENCOUNTER — Encounter: Payer: Self-pay | Admitting: Gastroenterology

## 2022-04-03 ENCOUNTER — Encounter: Payer: Self-pay | Admitting: Internal Medicine

## 2022-04-03 ENCOUNTER — Ambulatory Visit: Payer: Federal, State, Local not specified - PPO | Admitting: Internal Medicine

## 2022-04-03 DIAGNOSIS — M546 Pain in thoracic spine: Secondary | ICD-10-CM

## 2022-04-03 MED ORDER — AZITHROMYCIN 250 MG PO TABS: ORAL_TABLET | ORAL | 0 refills | Status: AC

## 2022-04-03 NOTE — Patient Instructions (Signed)
I have sent in azithromycin to take in 3-5 days if not improving. If needed take 2 pills on day 1, then 1 pill daily on days 2-5.

## 2022-04-03 NOTE — Progress Notes (Signed)
   Subjective:   Patient ID: Renee [name], female    DOB: [birth year], 61 y.o.   MRN: [id number]  HPI The patient is a 61 YO female coming in for back pain some mild cold symptoms.  Review of Systems  Constitutional: Negative.   HENT: Negative.    Eyes: Negative.   Respiratory:  Negative for cough, chest tightness and shortness of breath.   Cardiovascular:  Negative for chest pain, palpitations and leg swelling.  Gastrointestinal:  Negative for abdominal distention, abdominal pain, constipation, diarrhea, nausea and vomiting.  Musculoskeletal:  Positive for back pain.  Skin: Negative.   Neurological: Negative.   Psychiatric/Behavioral: Negative.      Objective:  Physical Exam Constitutional:      Appearance: She is well-developed.  HENT:     Head: Normocephalic and atraumatic.  Cardiovascular:     Rate and Rhythm: Normal rate and regular rhythm.  Pulmonary:     Effort: Pulmonary effort is normal. No respiratory distress.     Breath sounds: Normal breath sounds. No wheezing or rales.  Abdominal:     General: Bowel sounds are normal. There is no distension.     Palpations: Abdomen is soft.     Tenderness: There is no abdominal tenderness. There is no rebound.  Musculoskeletal:        General: Tenderness present.     Cervical back: Normal range of motion.  Skin:    General: Skin is warm and dry.  Neurological:     Mental Status: She is alert and oriented to person, place, and time.     Coordination: Coordination normal.     Vitals:   04/03/22 1009  BP: 110/64  Pulse: 68  Temp: 98.1 F (36.7 C)  SpO2: 97%  Weight: 130 lb (59 kg)  Height: 5' 7.5" (1.715 m)    Assessment & Plan:

## 2022-04-06 ENCOUNTER — Encounter: Payer: Self-pay | Admitting: Internal Medicine

## 2022-04-06 DIAGNOSIS — M546 Pain in thoracic spine: Secondary | ICD-10-CM | POA: Insufficient documentation

## 2022-04-06 NOTE — Assessment & Plan Note (Signed)
Could be related to lung issues. She does have some changes on CT scan previously and the new pain with vague sinus symptoms 1-2 weeks ago. Will give another 1 week to resolve. If no improvement rx azithromycin 5 day course she will fill and take.

## 2022-04-19 NOTE — Progress Notes (Unsigned)
Renee [name] [place] [address] [place] Phone: [phone] Subjective:   IVilma [name], am serving as a scribe for Dr. Hulan Saas.  I'm seeing this patient by the request  of:  [name], MD  CC: Right arm pain  LFY:[id number]  08/09/2021 Patient seems to be doing well overall. Mild learning scarring noted.  Medication seems like she has great strength.  Not affecting daily activities all the time.  We have discussed with her different treatment options but would like to continue with the conservative therapy.  We did discuss the possibility of PRP if needed.  Follow-up in 2 to 3 months  Updated 04/23/2022 [name] is a 61 y.o. female coming in with complaint of elbow pain. Right elbow pain for the last 3 months. Feels like a bruise. Doesn't stop her from doing activity, but does have to lift less weight on that side. Would like you to recheck right shoulder. Feels like she re-injured it.       Past Medical History:  Diagnosis Date   Abnormal chest x-ray    CXR c/w COPD. Full PFTs '11 - normal   Allergy    seasonal   Anxiety    Arthritis    both knees, post traumatic - neck   Cataract    removed bilateral   Depression    with anxiety.   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   Migraines    during teenage years   MVP (mitral valve prolapse)    last 2D echo approx '10   Osteopenia    DEXA 03/2012: -1.0   Past Surgical History:  Procedure Laterality Date   APPENDECTOMY     BUNIONECTOMY WITH HAMMERTOE RECONSTRUCTION Left 07/06/13   [place] CATHETERIZATION  2006   normal study   [place]  2011   right hand   HYSTEROPLASTY REPAIR OF UTERINE ANOMALY     2003 - laproscopically; 2006 - laparotomy   LAPAROSCOPIC ENDOMETRIOSIS FULGURATION     1996 and 2002   MANDIBLE RECONSTRUCTION      NASAL SEPTUM SURGERY  11/2012   [facility] FINGER RELEASE  2011   R thimb and ring finger   TRIGGER FINGER RELEASE  02/04/2012   [place], in office   Social History   Socioeconomic History   Marital status: Married    Spouse name: Not on file   Number of children: 0   Years of education: 16   Highest education level: Not on file  Occupational History   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   Married '94.   Denies any physical or sexual abuse.   Family in [place]: parents and sister.   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   Has smoke alarms, wears seatbelt at all times,  uses helmut when appropriate, firearms present in the home, uses herbal remedies, caffeine- 2-3 per day.   No regular exercise.   Social Determinants of Health   Financial Resource Strain: Not on file  Food Insecurity: Not on file  Transportation Needs: Not on file  Physical Activity: Not on file  Stress: Not on file  Social Connections: Not on file   Allergies  Allergen Reactions   Codeine Other (See Comments)    Hot flashes/passed out   Pollen Extract Other (See Comments)    drainage   Family History  Problem Relation Age of Onset   Allergic rhinitis Mother    Arthritis Mother        DOB 101   Cancer Mother        Thyroid cancer   Hypertension Mother    Atrial fibrillation Mother    Melanoma Mother    Breast cancer Mother    Arthritis Father        DOB 1929   Melanoma Father    Allergic rhinitis Sister    Colon polyps Neg Hx    Rectal cancer Neg Hx    Stomach cancer  Neg Hx      Current Outpatient Medications (Cardiovascular):    rosuvastatin (CRESTOR) 40 MG tablet, Take 1 tablet (40 mg total) by mouth daily.  Current Outpatient Medications (Respiratory):    albuterol (VENTOLIN HFA) 108 (90 Base) MCG/ACT inhaler, Inhale 2 puffs into the lungs every 6 (six) hours as needed for wheezing or shortness of breath.   budesonide-formoterol (SYMBICORT) 80-4.5 MCG/ACT inhaler, Inhale 2 puffs into the lungs daily as needed.   fexofenadine (ALLEGRA) 180 MG tablet, Take 180 mg by mouth daily as needed for allergies or rhinitis.   fluticasone (FLONASE) 50 MCG/ACT nasal spray, SPRAY 2 SPRAYS INTO EACH NOSTRIL EVERY DAY   montelukast (SINGULAIR) 10 MG tablet, TAKE 1 TABLET BY MOUTH EVERYDAY AT BEDTIME   Olopatadine-Mometasone (RYALTRIS) 665-25 MCG/ACT SUSP, Place 1 spray into the nose in the morning and at bedtime.  Current Outpatient Medications (Analgesics):    aspirin EC 81 MG tablet, Take 1 tablet (81 mg total) by mouth daily. Swallow whole.   Current Outpatient Medications (Other):    Cholecalciferol (VITAMIN D3) 50 MCG (2000 UT) capsule, Take 2,000 Units by mouth daily.   DIGESTIVE ENZYMES PO, Take 1 tablet by mouth daily as needed.   magnesium oxide (MAG-OX) 400 (240 Mg) MG tablet, Take 400 mg by mouth daily.   metroNIDAZOLE (METROCREAM) 0.75 % cream, Apply 1 application topically 2 (two) times daily.   Omega-3 Fatty Acids (FISH OIL TRIPLE STRENGTH) 1400 MG CAPS, Take by mouth daily as needed.   omeprazole (PRILOSEC) 20 MG capsule, Take 20 mg by mouth daily as needed.   Plant Sterols and Stanols (CHOLESTOFF PLUS PO), Take by mouth.   Probiotic Product (PROBIOTIC DAILY PO), Take 1 tablet by mouth daily.   sertraline (ZOLOFT) 50 MG tablet, Take 2.5 tablets (125 mg total) by mouth daily.   simethicone (MYLICON) 80 MG chewable tablet, Chew 80 mg by mouth every 6 (six) hours as needed for flatulence.   vitamin E 180 MG (400 UNITS) capsule, Take 400 Units by mouth  daily.   Reviewed prior external information including notes and imaging from  primary care provider As well as notes that were available from care everywhere and other healthcare systems.  Past medical history, social, surgical and family history all reviewed in electronic medical record.  No pertanent information unless stated regarding  to the chief complaint.   Review of Systems:  No headache, visual changes, nausea, vomiting, diarrhea, constipation, dizziness, abdominal pain, skin rash, fevers, chills, night sweats, weight loss, swollen lymph nodes, body aches, joint swelling, chest pain, shortness of breath, mood changes. POSITIVE muscle aches  Objective  Blood pressure 116/76, pulse 71, height '5\' 7"'$  (1.702 m), weight 134 lb (60.8 kg), SpO2 97 %.   General: No apparent distress alert and oriented x3 mood and affect normal, dressed appropriately.  HEENT: Pupils equal, extraocular movements intact  Respiratory: Patient's speak in full sentences and does not appear short of breath  Cardiovascular: No lower extremity edema, non tender, no erythema  Patient's right shoulder does have unfortunately still and some impingement noted.  Rotator cuff strength does appear to be intact.  Patient's right elbow and does have tenderness over the ulnar aspect of it in the medial epicondylar area.  Patient now has pain with Tinel's.  No pain with resisted flexion noted.  Limited muscular skeletal ultrasound was performed and interpreted by Hulan Saas, M  Limited ultrasound of patient's elbow does show the patient does have subluxation of the ulnar nerve.  He does have increasing dynamic noted.  Patient's rotator cuff does have some hypoechoic changes that appear to be more of a cystic formation on the anterior lateral aspect near the insertion of the labrum. Impression: Cyst of the anterior labrum.       Impression and Recommendations:    The above documentation has been reviewed and is  accurate and complete Lyndal Pulley, DO

## 2022-04-23 ENCOUNTER — Ambulatory Visit: Payer: Federal, State, Local not specified - PPO | Admitting: Family Medicine

## 2022-04-23 ENCOUNTER — Ambulatory Visit: Payer: Self-pay

## 2022-04-23 ENCOUNTER — Encounter: Payer: Self-pay | Admitting: Family Medicine

## 2022-04-23 ENCOUNTER — Other Ambulatory Visit: Payer: Self-pay

## 2022-04-23 ENCOUNTER — Encounter: Payer: Self-pay | Admitting: Allergy & Immunology

## 2022-04-23 ENCOUNTER — Ambulatory Visit: Payer: Federal, State, Local not specified - PPO | Admitting: Allergy & Immunology

## 2022-04-23 VITALS — BP 116/76 | HR 71 | Ht 67.0 in | Wt 134.0 lb

## 2022-04-23 VITALS — BP 108/78 | HR 60 | Temp 97.3°F | Resp 17

## 2022-04-23 DIAGNOSIS — M75111 Incomplete rotator cuff tear or rupture of right shoulder, not specified as traumatic: Secondary | ICD-10-CM | POA: Diagnosis not present

## 2022-04-23 DIAGNOSIS — J301 Allergic rhinitis due to pollen: Secondary | ICD-10-CM

## 2022-04-23 DIAGNOSIS — K219 Gastro-esophageal reflux disease without esophagitis: Secondary | ICD-10-CM

## 2022-04-23 DIAGNOSIS — R0602 Shortness of breath: Secondary | ICD-10-CM

## 2022-04-23 DIAGNOSIS — M25521 Pain in right elbow: Secondary | ICD-10-CM | POA: Diagnosis not present

## 2022-04-23 MED ORDER — BUDESONIDE-FORMOTEROL FUMARATE 80-4.5 MCG/ACT IN AERO: 2.0000 | INHALATION_SPRAY | Freq: Every day | RESPIRATORY_TRACT | 2 refills | Status: DC | PRN

## 2022-04-23 MED ORDER — RYALTRIS 665-25 MCG/ACT NA SUSP: 1.0000 | Freq: Two times a day (BID) | NASAL | 5 refills | Status: DC

## 2022-04-23 NOTE — Addendum Note (Signed)
Addended by: Valentina Shaggy on: 04/23/2022 04:04 PM   Modules accepted: Orders

## 2022-04-23 NOTE — Patient Instructions (Addendum)
Heat and massage Restart gabapentin  Ice 20 min 2x a day Exercises See me in 6-8 weeks

## 2022-04-23 NOTE — Progress Notes (Signed)
FOLLOW UP  Date of Service/Encounter:  04/23/22   Assessment:   SOB (shortness of breath) - with improvement with PRN use of Symbicort   Seasonal allergic rhinitis due to pollen (grasses) - not well controlled, although she is not using Flonase due to concern for possible dementia   Gastroesophageal reflux disease  Plan/Recommendations:   1. SOB (shortness of breath) - Breathing test looks stable today.  - I think we are on the right track. - Daily controller medication(s): Symbicort 80/4.65mg two puffs twice daily with spacer AS NEEDED - Prior to physical activity: albuterol 2 puffs 10-15 minutes before physical activity. - Rescue medications: albuterol 4 puffs every 4-6 hours as needed - Asthma control goals:  * Full participation in all desired activities (may need albuterol before activity) * Albuterol use two time or less a week on average (not counting use with activity) * Cough interfering with sleep two time or less a month * Oral steroids no more than once a year * No hospitalizations  2. Seasonal allergic rhinitis due to pollen (grasses) - It seems that you have everything under control.  - Continue with: Allegra (fexofenadine) '180mg'$  tablet once daily (alternate every 3 months) - Continue with: Singulair (montelukast) '10mg'$  daily - Add on: Ryaltris one spray per nostril twice daily (DO EVERY DAY) - We are going to send you see ENT ([facility]  - Give [place] update next week.   3. Gastroesophageal reflux disease - Continue with dietary changes.   4. Return in about 3 months (around 07/24/2022).   Subjective:   Renee [name] a 61y.o. female presenting today for follow up of  Chief Complaint  Patient presents with   Allergic Rhinitis    Shortness of Breath    AHaneen Bernaleshas a history of the following: Patient Active Problem List   Diagnosis Date Noted   Thoracic back pain 04/06/2022   SOB (shortness of breath)  07/26/2021   Obstructive sleep apnea hypopnea, mild 05/17/2021   Partial tear of right rotator cuff 02/17/2020   Family history of thyroid cancer 12/19/2017   Gastroesophageal reflux disease 04/22/2016   Routine general medical examination at a health care facility 03/16/2015   Osteopenia 04/06/2012   Depression 11/11/2011   Trigger finger of both hands    IBS (irritable bowel syndrome)     History obtained from: chart review and patient.  AUllais a 61y.o. female presenting for a follow up visit.  She was last seen in April 2023.  At that time, her breathing test looks stable.  We continue with Symbicort 80 mcg 2 puffs twice daily as needed.  Her reflux was controlled with dietary changes.  Allergic rhinitis was under good control with Xyzal as well as Allegra and Singulair.  Since last visit, she is largely doing well.  However, after talking with her for a while, it seems that this is most likely her due through her rose colored glasses, as her symptoms do not seem to actually be better today at all.   Asthma/Respiratory Symptom History: She is using Symbicort as needed.  She tries to remember it before pilates and strength training. She will sometimes use it when she goes to bed. She does not get a refill too often, maybe once every three months or so. It is not expensive when she gets it. She does not remember it being to expensive. She does cough at night, which is nearly every  night. She reports that she has a lot of phlegm production and issues throughout the summer. She has postnasal drip and runny nose. She hsa issues with the spring and the fall.   Allergic Rhinitis Symptom History: Spring and fall typically result in allergy symptoms.  She is still doing the Singulair and the Flonase. She is doing the Flonase as needed. She is doing fexofenadine, but she cannot tell a difference with this as well. She does use nasal saline occasionally. She has never tried hte azelastine. She thinks  that her mucous production is worse in the morning. She did have a deviated septum.   GERD Symptom History: She does have a history of reflux.  She uses famotidine on a as needed basis.  She has never used anything stronger than famotidine.  She has changed her diet to accommodate her reflux symptoms.  Otherwise, there have been no changes to her past medical history, surgical history, family history, or social history.    Review of Systems  Constitutional: Negative.  Negative for chills, fever, malaise/fatigue and weight loss.  HENT: Negative.  Negative for congestion, ear discharge, ear pain and sinus pain.   Eyes:  Negative for pain, discharge and redness.  Respiratory:  Negative for cough, sputum production, shortness of breath and wheezing.   Cardiovascular: Negative.  Negative for chest pain and palpitations.  Gastrointestinal:  Negative for abdominal pain, constipation, diarrhea, heartburn, nausea and vomiting.  Skin: Negative.  Negative for itching and rash.  Neurological:  Negative for dizziness and headaches.  Endo/Heme/Allergies:  Negative for environmental allergies. Does not bruise/bleed easily.       Objective:   Blood pressure 108/78, pulse 60, temperature (!) 97.3 F (36.3 C), temperature source Temporal, resp. rate 17, SpO2 98 %. There is no height or weight on file to calculate BMI.    Physical Exam Vitals reviewed.  Constitutional:      Appearance: She is well-developed.     Comments: Very pleasant and talkative.  HENT:     Head: Normocephalic and atraumatic.     Right Ear: Tympanic membrane, ear canal and external ear normal. No drainage, swelling or tenderness. Tympanic membrane is not injected, scarred, erythematous, retracted or bulging.     Left Ear: Tympanic membrane, ear canal and external ear normal. No drainage, swelling or tenderness. Tympanic membrane is not injected, scarred, erythematous, retracted or bulging.     Nose: No nasal deformity, septal  deviation, mucosal edema or rhinorrhea.     Right Turbinates: Enlarged, swollen and pale.     Left Turbinates: Enlarged, swollen and pale.     Right Sinus: No maxillary sinus tenderness or frontal sinus tenderness.     Left Sinus: No maxillary sinus tenderness or frontal sinus tenderness.     Comments: Congestion is much worse on the left versus the right.  No epistaxis.    Mouth/Throat:     Mouth: Mucous membranes are not pale and not dry.     Pharynx: Uvula midline.  Eyes:     General:        Right eye: No discharge.        Left eye: No discharge.     Conjunctiva/sclera: Conjunctivae normal.     Right eye: Right conjunctiva is not injected. No chemosis.    Left eye: Left conjunctiva is not injected. No chemosis.    Pupils: Pupils are equal, round, and reactive to light.  Cardiovascular:     Rate and Rhythm: Normal rate and regular  rhythm.     Heart sounds: Normal heart sounds.  Pulmonary:     Effort: Pulmonary effort is normal. No tachypnea, accessory muscle usage or respiratory distress.     Breath sounds: Normal breath sounds. No wheezing, rhonchi or rales.     Comments: Moving air well in all lung fields.  No increased work of breathing.  Crackles. Chest:     Chest wall: No tenderness.  Abdominal:     Tenderness: There is no abdominal tenderness. There is no guarding or rebound.  Lymphadenopathy:     Head:     Right side of head: No submandibular, tonsillar or occipital adenopathy.     Left side of head: No submandibular, tonsillar or occipital adenopathy.     Cervical: No cervical adenopathy.  Skin:    Coloration: Skin is not pale.     Findings: No abrasion, erythema, petechiae or rash. Rash is not papular, urticarial or vesicular.  Neurological:     Mental Status: She is alert.      Diagnostic studies:    Spirometry: results normal (FEV1: 2.40/88%, FVC: 3.08/89%, FEV1/FVC: 78%).    Spirometry consistent with normal pattern.   Allergy Studies: none         Salvatore Marvel, MD  [facility]

## 2022-04-23 NOTE — Assessment & Plan Note (Signed)
Patient is doing relatively well at the moment.  Patient has had partial tearing previously.  On exam today patient does have hypoechoic changes that seems to be more of a cyst on the anterior labral area.  We discussed potential injection but patient would like to try the home exercises and icing regimen first.  Discussed which activities to do and which ones to avoid.  Increase activity slowly otherwise.  Follow-up again in 6 to 8 weeks.

## 2022-04-23 NOTE — Patient Instructions (Addendum)
1. SOB (shortness of breath) - Breathing test looks stable today.  - I think we are on the right track. - Daily controller medication(s): Symbicort 80/4.102mg two puffs twice daily with spacer AS NEEDED - Prior to physical activity: albuterol 2 puffs 10-15 minutes before physical activity. - Rescue medications: albuterol 4 puffs every 4-6 hours as needed - Asthma control goals:  * Full participation in all desired activities (may need albuterol before activity) * Albuterol use two time or less a week on average (not counting use with activity) * Cough interfering with sleep two time or less a month * Oral steroids no more than once a year * No hospitalizations  2. Seasonal allergic rhinitis due to pollen (grasses) - It seems that you have everything under control.  - Continue with: Allegra (fexofenadine) '180mg'$  tablet once daily (alternate every 3 months) - Continue with: Singulair (montelukast) '10mg'$  daily - Add on: Ryaltris one spray per nostril twice daily (DO EVERY DAY) - We are going to send you see ENT ([facility]  - Give u[place] update next week.   3. Gastroesophageal reflux disease - Continue with dietary changes.   4. Return in about 3 months (around 07/24/2022).    Please inform u[place]oreaof any Emergency Department visits, hospitalizations, or changes in symptoms. Call u[place]oreabefore going to the ED for breathing or allergy symptoms since we might be able to fit you in for a sick visit. Feel free to contact uKoreaanytime with any questions, problems, or concerns.  It was a pleasure to see you today!  Websites that have reliable patient information: 1. American Academy of Asthma, Allergy, and Immunology: www.aaaai.org 2. Food Allergy Research and Education (FARE): foodallergy.org 3. Mothers of Asthmatics: http://www.asthmacommunitynetwork.org 4. American College of Allergy, Asthma, and Immunology: www.acaai.org   COVID-19 Vaccine Information can be found at:  [email or web address] questions related to vaccine distribution or appointments, please email [email or web address] or call [phone]   We realize that you might be concerned about having an allergic reaction to the COVID19 vaccines. To help with that concern, WE ARE OFFERING THE COVID19 VACCINES IN OUR OFFICE! Ask the front desk for dates!     "Like" u[place]on Facebook and Instagram for our latest updates!      A healthy democracy works best when ANew York Life Insuranceparticipate! Make sure you are registered to vote! If you have moved or changed any of your contact information, you will need to get this updated before voting!  In some cases, you MAY be able to register to vote online: [email or web address]

## 2022-06-04 NOTE — Progress Notes (Signed)
[name] [place] Phone: [phone] Subjective:   Renee [name], am serving as a scribe for Dr. Hulan Saas.  I'm seeing this patient by the request  of:  [name], MD  CC: Right shoulder and elbow pain follow-up  [id number]  04/23/2022 Patient is doing relatively well at the moment.  Patient has had partial tearing previously.  On exam today patient does have hypoechoic changes that seems to be more of a cyst on the anterior labral area.  We discussed potential injection but patient would like to try the home exercises and icing regimen first.  Discussed which activities to do and which ones to avoid.  Increase activity slowly otherwise.  Follow-up again in 6 to 8 weeks.     Update 06/06/2022 Renee [name] is a 61 y.o. femaleright shoulder and elbow pain  coming in with complaint of R elbow pain. Patient states that the elbow is doing better, but has not felt the tendon pop back in. Picking up groceries or grankids is when she will really feel the pain.  Patient states that she has made some improvement but still not all the way they are.     Past Medical History:  Diagnosis Date   Abnormal chest x-ray    CXR c/w COPD. Full PFTs '11 - normal   Allergy    seasonal   Anxiety    Arthritis    both knees, post traumatic - neck   Cataract    removed bilateral   Depression    with anxiety.   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   Migraines    during teenage years   MVP (mitral valve prolapse)    last 2D echo approx '10   Osteopenia    DEXA 03/2012: -1.0   Past Surgical History:  Procedure Laterality Date   APPENDECTOMY     BUNIONECTOMY WITH HAMMERTOE RECONSTRUCTION Left 07/06/13   [place] CATHETERIZATION  2006   normal study   [place]  2011   right hand    HYSTEROPLASTY REPAIR OF UTERINE ANOMALY     2003 - laproscopically; 2006 - laparotomy   LAPAROSCOPIC ENDOMETRIOSIS FULGURATION     1996 and 2002   MANDIBLE RECONSTRUCTION     NASAL SEPTUM SURGERY  11/2012   [facility] FINGER RELEASE  2011   R thimb and ring finger   TRIGGER FINGER RELEASE  02/04/2012   [place], in office   Social History   Socioeconomic History   Marital status: Married    Spouse name: Not on file   Number of children: 0   Years of education: 16   Highest education level: Not on file  Occupational History   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   Married '94.  Denies any physical or sexual abuse.   Family in [place]: parents and sister.   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   Has smoke alarms, wears seatbelt at all times, uses helmut when appropriate, firearms present in the home, uses herbal remedies, caffeine- 2-3 per day.   No regular exercise.   Social Determinants of Health   Financial Resource Strain: Not on file  Food Insecurity: Not on file  Transportation Needs: Not on file  Physical Activity: Not on file  Stress: Not on file  Social Connections: Not on file   Allergies  Allergen Reactions   Codeine Other (See Comments)    Hot flashes/passed out   Pollen Extract Other (See Comments)    drainage   Family History  Problem Relation Age of Onset   Allergic rhinitis Mother    Arthritis Mother        DOB 27   Cancer Mother        Thyroid cancer   Hypertension Mother    Atrial fibrillation Mother    Melanoma Mother     Breast cancer Mother    Arthritis Father        DOB 1929   Melanoma Father    Allergic rhinitis Sister    Colon polyps Neg Hx    Rectal cancer Neg Hx    Stomach cancer Neg Hx      Current Outpatient Medications (Cardiovascular):    rosuvastatin (CRESTOR) 40 MG tablet, Take 1 tablet (40 mg total) by mouth daily.  Current Outpatient Medications (Respiratory):    albuterol (VENTOLIN HFA) 108 (90 Base) MCG/ACT inhaler, Inhale 2 puffs into the lungs every 6 (six) hours as needed for wheezing or shortness of breath.   budesonide-formoterol (SYMBICORT) 80-4.5 MCG/ACT inhaler, Inhale 2 puffs into the lungs daily as needed.   fexofenadine (ALLEGRA) 180 MG tablet, Take 180 mg by mouth daily as needed for allergies or rhinitis.   fluticasone (FLONASE) 50 MCG/ACT nasal spray, SPRAY 2 SPRAYS INTO EACH NOSTRIL EVERY DAY   montelukast (SINGULAIR) 10 MG tablet, TAKE 1 TABLET BY MOUTH EVERYDAY AT BEDTIME   Olopatadine-Mometasone (RYALTRIS) 665-25 MCG/ACT SUSP, Place 1 spray into the nose in the morning and at bedtime.  Current Outpatient Medications (Analgesics):    aspirin EC 81 MG tablet, Take 1 tablet (81 mg total) by mouth daily. Swallow whole.   Current Outpatient Medications (Other):    Cholecalciferol (VITAMIN D3) 50 MCG (2000 UT) capsule, Take 2,000 Units by mouth daily.   DIGESTIVE ENZYMES PO, Take 1 tablet by mouth daily as needed.   magnesium oxide (MAG-OX) 400 (240 Mg) MG tablet, Take 400 mg by mouth daily.   metroNIDAZOLE (METROCREAM) 0.75 % cream, Apply 1 application topically 2 (two) times daily.   Omega-3 Fatty Acids (FISH OIL TRIPLE STRENGTH) 1400 MG CAPS, Take by mouth daily as needed.   omeprazole (PRILOSEC) 20 MG capsule, Take 20 mg by mouth daily as needed.   Plant Sterols and Stanols (CHOLESTOFF PLUS PO), Take by mouth.   Probiotic Product (PROBIOTIC DAILY PO), Take 1 tablet by mouth daily.   sertraline (ZOLOFT) 50 MG tablet, Take 2.5 tablets (125 mg total) by mouth  daily.   simethicone (MYLICON) 80 MG chewable tablet, Chew 80 mg by mouth every 6 (six) hours as needed for flatulence.   vitamin E 180 MG (400 UNITS) capsule, Take 400 Units by mouth daily.   Reviewed prior external information including notes and imaging from  primary care provider As well  as notes that were available from care everywhere and other healthcare systems.  Past medical history, social, surgical and family history all reviewed in electronic medical record.  No pertanent information unless stated regarding to the chief complaint.   Review of Systems:  No headache, visual changes, nausea, vomiting, diarrhea, constipation, dizziness, abdominal pain, skin rash, fevers, chills, night sweats, weight loss, swollen lymph nodes, body aches, joint swelling, chest pain, shortness of breath, mood changes. POSITIVE muscle aches  Objective  Blood pressure 100/60, pulse 61, height '5\' 7"'$  (1.702 m), weight 130 lb (59 kg), SpO2 98 %.   General: No apparent distress alert and oriented x3 mood and affect normal, dressed appropriately.  HEENT: Pupils equal, extraocular movements intact  Respiratory: Patient's speak in full sentences and does not appear short of breath  Cardiovascular: No lower extremity edema, non tender, no erythema  Right rotator cuff does have positive impingement noted.  Mild positive crossover.  Patient's strength has improved.  Right elbow exam seems to have more of tenderness over the cubital tunnel.  Positive Tinel's in this area.  Does have audible popping with full extension and flexion.  Limited muscular skeletal ultrasound was performed and interpreted by Hulan Saas, M  Limited ultrasound of patient's elbow shows that there is some hypoechoic changes and some dilatation of the ulnar nerve noted and does seem to be subluxed from the cubital tunnel. Impression: Ulnar neuropathy with subluxation    Impression and Recommendations:     The above documentation has  been reviewed and is accurate and complete Lyndal Pulley, DO

## 2022-06-06 ENCOUNTER — Ambulatory Visit: Payer: Federal, State, Local not specified - PPO | Admitting: Family Medicine

## 2022-06-06 ENCOUNTER — Ambulatory Visit: Payer: Self-pay

## 2022-06-06 VITALS — BP 100/60 | HR 61 | Ht 67.0 in | Wt 130.0 lb

## 2022-06-06 DIAGNOSIS — M75111 Incomplete rotator cuff tear or rupture of right shoulder, not specified as traumatic: Secondary | ICD-10-CM | POA: Diagnosis not present

## 2022-06-06 DIAGNOSIS — G5621 Lesion of ulnar nerve, right upper limb: Secondary | ICD-10-CM | POA: Diagnosis not present

## 2022-06-06 DIAGNOSIS — M25521 Pain in right elbow: Secondary | ICD-10-CM

## 2022-06-06 NOTE — Patient Instructions (Signed)
Good to see you  Frog splints to wear at night Wear compression when working out Avoid full extension and fill flexion Put the squish ball in your elbow at night to sleep Okay to push the shoulder Follow up in 6 weeks

## 2022-06-07 ENCOUNTER — Encounter: Payer: Self-pay | Admitting: Family Medicine

## 2022-06-07 ENCOUNTER — Telehealth: Payer: Self-pay | Admitting: Family Medicine

## 2022-06-07 DIAGNOSIS — G5621 Lesion of ulnar nerve, right upper limb: Secondary | ICD-10-CM | POA: Insufficient documentation

## 2022-06-07 MED ORDER — PREDNISONE 50 MG PO TABS: 50.0000 mg | ORAL_TABLET | Freq: Every day | ORAL | 0 refills | Status: DC

## 2022-06-07 NOTE — Assessment & Plan Note (Signed)
Likely some subluxation occurring of the nerve.  Discussed compression, home exercises and flossing of the nerve.  Worsening pain will consider injection.  Follow-up again in 6 to 8 weeks

## 2022-06-07 NOTE — Assessment & Plan Note (Signed)
Patient is doing relatively well with the shoulder but still feel that there is a potential for the partial tear of the rotator cuff.  Patient has made improvement with no significant worsening strength.  Discussed with patient about icing regimen and home exercises otherwise.  Continue to stay active.  Follow-up again in 6 to 8 weeks

## 2022-06-07 NOTE — Telephone Encounter (Signed)
Pt states Dr. Tamala Julian was going to call in prednisone for her after yesterday's visit. He "popped" a nerve and she cannot take ibuprofen.  Renee [name]

## 2022-06-07 NOTE — Telephone Encounter (Signed)
Rx sent in to pharmacy. 

## 2022-07-11 ENCOUNTER — Encounter (HOSPITAL_BASED_OUTPATIENT_CLINIC_OR_DEPARTMENT_OTHER): Payer: Self-pay

## 2022-07-11 NOTE — Telephone Encounter (Signed)
Hey ladies is it too early to get this patient scheduled for her CT?

## 2022-07-16 NOTE — Progress Notes (Signed)
[facility] [place] Phone: [phone] Subjective:   Renee [name], am serving as a scribe for Dr. Hulan Saas.  I'm seeing this patient by the request  of:  [name], MD  CC: right elbow and right shoulder pain   [id number]  06/06/2022 Likely some subluxation occurring of the nerve.  Discussed compression, home exercises and flossing of the nerve.  Worsening pain will consider injection.  Follow-up again in 6 to 8 weeks     Patient is doing relatively well with the shoulder but still feel that there is a potential for the partial tear of the rotator cuff. Patient has made improvement with [name] significant worsening strength. Discussed with patient about icing regimen and home exercises otherwise. Continue to stay active. Follow-up again in 6 to 8 weeks   Update 07/18/2022 Renee [name] is a 62 y.o. female coming in with complaint of R elbow and R shoulder pain. Shoulder pain has improved somewhat.   Patient states that her R hand 2nd finger pain is not improving with using splint at night.   Elbow pain improved after nerve was reduced but after a few days her pain returned. Does feel like nerve is out of alignment.       Past Medical History:  Diagnosis Date   Abnormal chest x-ray    CXR c/w COPD. Full PFTs '11 - normal   Allergy    seasonal   Anxiety    Arthritis    both knees, post traumatic - neck   Cataract    removed bilateral   Depression    with anxiety.   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   Migraines    during teenage years   MVP (mitral valve prolapse)    last 2D echo approx '10   Osteopenia    DEXA 03/2012: -1.0   Past Surgical History:  Procedure Laterality Date   APPENDECTOMY     BUNIONECTOMY WITH HAMMERTOE RECONSTRUCTION Left 07/06/13   [place]  CATHETERIZATION  2006   normal study   [place]  2011   right hand   HYSTEROPLASTY REPAIR OF UTERINE ANOMALY     2003 - laproscopically; 2006 - laparotomy   LAPAROSCOPIC ENDOMETRIOSIS FULGURATION     1996 and 2002   MANDIBLE RECONSTRUCTION     NASAL SEPTUM SURGERY  11/2012   [facility] FINGER RELEASE  2011   R thimb and ring finger   TRIGGER FINGER RELEASE  02/04/2012   [place], in office   Social History   Socioeconomic History   Marital status: Married    Spouse name: Not on file   Number of children: 0   Years of education: 16   Highest education level: Not on file  Occupational History   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   Married '94.   Denies any physical or sexual abuse.   Family in [place]: parents and sister.   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   Has smoke alarms, wears seatbelt at all times, uses helmut when appropriate, firearms present in the home, uses herbal remedies, caffeine- 2-3 per day.   [name] regular exercise.   Social Determinants of Health   Financial Resource Strain: Not on file  Food Insecurity: Not on file  Transportation Needs: Not on file  Physical Activity: Not on file  Stress: Not on file  Social Connections: Not on file   Allergies  Allergen Reactions   Codeine Other (See Comments)    Hot flashes/passed out   Pollen Extract Other (See Comments)    drainage   Family History  Problem Relation Age of Onset   Allergic rhinitis Mother    Arthritis Mother        DOB 67   Cancer Mother         Thyroid cancer   Hypertension Mother    Atrial fibrillation Mother    Melanoma Mother    Breast cancer Mother    Arthritis Father        DOB 50   Melanoma Father    Allergic rhinitis Sister    Colon polyps Neg Hx    Rectal cancer Neg Hx    Stomach cancer Neg Hx     Current Outpatient Medications (Endocrine & Metabolic):    predniSONE (DELTASONE) 50 MG tablet, Take 1 tablet (50 mg total) by mouth daily with breakfast.  Current Outpatient Medications (Cardiovascular):    rosuvastatin (CRESTOR) 40 MG tablet, Take 1 tablet (40 mg total) by mouth daily.  Current Outpatient Medications (Respiratory):    albuterol (VENTOLIN HFA) 108 (90 Base) MCG/ACT inhaler, Inhale 2 puffs into the lungs every 6 (six) hours as needed for wheezing or shortness of breath.   budesonide-formoterol (SYMBICORT) 80-4.5 MCG/ACT inhaler, Inhale 2 puffs into the lungs daily as needed.   fexofenadine (ALLEGRA) 180 MG tablet, Take 180 mg by mouth daily as needed for allergies or rhinitis.   fluticasone (FLONASE) 50 MCG/ACT nasal spray, SPRAY 2 SPRAYS INTO EACH NOSTRIL EVERY DAY   montelukast (SINGULAIR) 10 MG tablet, TAKE 1 TABLET BY MOUTH EVERYDAY AT BEDTIME   Olopatadine-Mometasone (RYALTRIS) 665-25 MCG/ACT SUSP, Place 1 spray into the nose in the morning and at bedtime.  Current Outpatient Medications (Analgesics):    aspirin EC 81 MG tablet, Take 1 tablet (81 mg total) by mouth daily. Swallow whole.   Current Outpatient Medications (Other):    Cholecalciferol (VITAMIN D3) 50 MCG (2000 UT) capsule, Take 2,000 Units by mouth daily.   DIGESTIVE ENZYMES PO, Take 1 tablet by mouth daily as needed.   magnesium oxide (MAG-OX) 400 (240 Mg) MG tablet, Take 400 mg by mouth daily.   metroNIDAZOLE (METROCREAM) 0.75 % cream, Apply 1 application topically 2 (two) times daily.   Omega-3 Fatty Acids (FISH OIL TRIPLE STRENGTH) 1400 MG CAPS, Take by mouth daily as needed.   omeprazole (PRILOSEC) 20 MG capsule, Take 20 mg by  mouth daily as needed.   Plant Sterols and Stanols (CHOLESTOFF PLUS PO), Take by mouth.   Probiotic Product (PROBIOTIC DAILY PO), Take 1 tablet by mouth daily.   sertraline (ZOLOFT) 50 MG tablet, Take 2.5 tablets (125 mg total) by mouth daily.   simethicone (MYLICON) 80 MG chewable tablet, Chew 80 mg by mouth every 6 (six) hours  as needed for flatulence.   vitamin E 180 MG (400 UNITS) capsule, Take 400 Units by mouth daily.   Reviewed prior external information including notes and imaging from  primary care provider As well as notes that were available from care everywhere and other healthcare systems.  Past medical history, social, surgical and family history all reviewed in electronic medical record.  [name] pertanent information unless stated regarding to the chief complaint.   Review of Systems:  [name] headache, visual changes, nausea, vomiting, diarrhea, constipation, dizziness, abdominal pain, skin rash, fevers, chills, night sweats, weight loss, swollen lymph nodes, body aches, joint swelling, chest pain, shortness of breath, mood changes. POSITIVE muscle aches  Objective  Blood pressure 120/78, pulse 88, height '5\' 7"'$  (1.702 m), weight 134 lb (60.8 kg), SpO2 97 %.   General: [name] apparent distress alert and oriented x3 mood and affect normal, dressed appropriately.  HEENT: Pupils equal, extraocular movements intact  Respiratory: Patient's speak in full sentences and does not appear short of breath  Cardiovascular: [name] lower extremity edema, non tender, [name] erythema  Right elbow distal tenderness over the ulnar area.  Positive Tinel's noted. Right hand shows tenderness to palpation over the PIP of the first finger.  Full range of motion though passively.  Does have pain with full flexion noted.  More pain on the palmar aspect not the dorsal aspect.  Limited muscular skeletal ultrasound was performed and interpreted by Hulan Saas, M  Limited ultrasound shows patient still has hypoechoic  changes of the ulnar nerve.   Procedure: Real-time Ultrasound Guided Injection of right ulnar nerve sheath at elbow Device: GE Logiq Q7 Ultrasound guided injection is preferred based studies that show increased duration, increased effect, greater accuracy, decreased procedural pain, increased response rate, and decreased cost with ultrasound guided versus blind injection.  Verbal informed consent obtained.  Time-out conducted.  Noted [name] overlying erythema, induration, or other signs of local infection.  Skin prepped in a sterile fashion.  Local anesthesia: Topical Ethyl chloride.  With sterile technique and under real time ultrasound guidance: With a 25-gauge half inch needle injected with 0.5 cc of 0.5% Marcaine and 0.5 cc of Kenalog 40 mg/mL Completed without difficulty  Pain immediately resolved suggesting accurate placement of the medication.  Advised to call if fevers/chills, erythema, induration, drainage, or persistent bleeding.  Impression: Technically successful ultrasound guided injection.   Impression and Recommendations:     The above documentation has been reviewed and is accurate and complete Lyndal Pulley, DO

## 2022-07-18 ENCOUNTER — Ambulatory Visit (INDEPENDENT_AMBULATORY_CARE_PROVIDER_SITE_OTHER): Payer: Federal, State, Local not specified - PPO

## 2022-07-18 ENCOUNTER — Ambulatory Visit: Payer: Self-pay

## 2022-07-18 ENCOUNTER — Ambulatory Visit: Payer: Federal, State, Local not specified - PPO | Admitting: Family Medicine

## 2022-07-18 VITALS — BP 120/78 | HR 88 | Ht 67.0 in | Wt 134.0 lb

## 2022-07-18 DIAGNOSIS — M25521 Pain in right elbow: Secondary | ICD-10-CM | POA: Diagnosis not present

## 2022-07-18 DIAGNOSIS — M79641 Pain in right hand: Secondary | ICD-10-CM | POA: Diagnosis not present

## 2022-07-18 DIAGNOSIS — S62650A Nondisplaced fracture of medial phalanx of right index finger, initial encounter for closed fracture: Secondary | ICD-10-CM

## 2022-07-18 DIAGNOSIS — G5621 Lesion of ulnar nerve, right upper limb: Secondary | ICD-10-CM | POA: Diagnosis not present

## 2022-07-18 DIAGNOSIS — M19041 Primary osteoarthritis, right hand: Secondary | ICD-10-CM | POA: Diagnosis not present

## 2022-07-18 NOTE — Assessment & Plan Note (Signed)
Patient given injection and tolerated the procedure well.  Hopefully this will make some improvement.  We discussed with patient to continue some of the exercises and compression.  Chronic problem at the moment that did seem to be worsening.  Follow-up again in 6 to 8 weeks

## 2022-07-18 NOTE — Assessment & Plan Note (Signed)
Patient does have what appears to be a small index fracture noted, seems to be more of a small avulsion and does go intra-articular.  I do believe that buddy taping the toe could be beneficial for her.  Will try this and see how she responds.  Follow-up with me again in 2 to 3 weeks

## 2022-07-18 NOTE — Patient Instructions (Signed)
So Good to see you.  Modeling clay or silly puddy  Injected nerve  See me again 6 weeks

## 2022-07-25 ENCOUNTER — Ambulatory Visit
Admission: RE | Admit: 2022-07-25 | Discharge: 2022-07-25 | Disposition: A | Discharge status: Home / Self Care | Payer: Federal, State, Local not specified - PPO | Source: Ambulatory Visit | Attending: Obstetrics and Gynecology | Admitting: Obstetrics and Gynecology

## 2022-07-25 DIAGNOSIS — N6489 Other specified disorders of breast: Secondary | ICD-10-CM | POA: Diagnosis not present

## 2022-07-25 DIAGNOSIS — Z803 Family history of malignant neoplasm of breast: Secondary | ICD-10-CM

## 2022-07-25 MED ORDER — GADOPICLENOL 0.5 MMOL/ML IV SOLN
6.0000 mL | Freq: Once | INTRAVENOUS | Status: AC | PRN
  Administered 2022-07-25: 6 mL via INTRAVENOUS

## 2022-07-31 ENCOUNTER — Ambulatory Visit (INDEPENDENT_AMBULATORY_CARE_PROVIDER_SITE_OTHER): Payer: Federal, State, Local not specified - PPO | Admitting: Internal Medicine

## 2022-07-31 ENCOUNTER — Encounter: Payer: Self-pay | Admitting: Internal Medicine

## 2022-07-31 VITALS — BP 120/80 | HR 70 | Temp 97.9°F | Ht 67.0 in | Wt 134.0 lb

## 2022-07-31 DIAGNOSIS — I2510 Atherosclerotic heart disease of native coronary artery without angina pectoris: Secondary | ICD-10-CM

## 2022-07-31 DIAGNOSIS — Z808 Family history of malignant neoplasm of other organs or systems: Secondary | ICD-10-CM | POA: Diagnosis not present

## 2022-07-31 DIAGNOSIS — Z23 Encounter for immunization: Secondary | ICD-10-CM

## 2022-07-31 DIAGNOSIS — Z0000 Encounter for general adult medical examination without abnormal findings: Secondary | ICD-10-CM

## 2022-07-31 DIAGNOSIS — K582 Mixed irritable bowel syndrome: Secondary | ICD-10-CM

## 2022-07-31 DIAGNOSIS — M8580 Other specified disorders of bone density and structure, unspecified site: Secondary | ICD-10-CM | POA: Diagnosis not present

## 2022-07-31 DIAGNOSIS — K219 Gastro-esophageal reflux disease without esophagitis: Secondary | ICD-10-CM

## 2022-07-31 DIAGNOSIS — I2584 Coronary atherosclerosis due to calcified coronary lesion: Secondary | ICD-10-CM | POA: Diagnosis not present

## 2022-07-31 DIAGNOSIS — F3341 Major depressive disorder, recurrent, in partial remission: Secondary | ICD-10-CM

## 2022-07-31 LAB — LIPID PANEL
Cholesterol: 148 mg/dL (ref 0–200)
HDL: 83.9 mg/dL (ref >39.00)
LDL Cholesterol: 55 mg/dL (ref 0–99)
NonHDL: 64.49
Total CHOL/HDL Ratio: 2
Triglycerides: 45 mg/dL (ref 0.0–149.0)
VLDL: 9 mg/dL (ref 0.0–40.0)

## 2022-07-31 LAB — COMPREHENSIVE METABOLIC PANEL
ALT: 20 U/L (ref 0–35)
AST: 24 U/L (ref 0–37)
Albumin: 4.8 g/dL (ref 3.5–5.2)
Alkaline Phosphatase: 56 U/L (ref 39–117)
BUN: 16 mg/dL (ref 6–23)
CO2: 31 mEq/L (ref 19–32)
Calcium: 9.5 mg/dL (ref 8.4–10.5)
Chloride: 101 mEq/L (ref 96–112)
Creatinine, Ser: 0.9 mg/dL (ref 0.40–1.20)
GFR: 69.18 mL/min (ref >60.00)
Glucose, Bld: 79 mg/dL (ref 70–99)
Potassium: 4.2 mEq/L (ref 3.5–5.1)
Sodium: 138 mEq/L (ref 135–145)
Total Bilirubin: 0.9 mg/dL (ref 0.2–1.2)
Total Protein: 7.6 g/dL (ref 6.0–8.3)

## 2022-07-31 LAB — CBC
HCT: 40.9 % (ref 36.0–46.0)
Hemoglobin: 13.8 g/dL (ref 12.0–15.0)
MCHC: 33.8 g/dL (ref 30.0–36.0)
MCV: 90.8 fl (ref 78.0–100.0)
Platelets: 205 10*3/uL (ref 150.0–400.0)
RBC: 4.5 Mil/uL (ref 3.87–5.11)
RDW: 13.3 % (ref 11.5–15.5)
WBC: 4.3 10*3/uL (ref 4.0–10.5)

## 2022-07-31 LAB — VITAMIN B12: Vitamin B-12: 1272 pg/mL — ABNORMAL HIGH (ref 211–911)

## 2022-07-31 LAB — VITAMIN D 25 HYDROXY (VIT D DEFICIENCY, FRACTURES): VITD: 34.31 ng/mL (ref 30.00–100.00)

## 2022-07-31 LAB — TSH: TSH: 2.89 u[IU]/mL (ref 0.35–5.50)

## 2022-07-31 MED ORDER — ESCITALOPRAM OXALATE 10 MG PO TABS: 10.0000 mg | ORAL_TABLET | Freq: Every day | ORAL | 1 refills | Status: DC

## 2022-07-31 NOTE — Patient Instructions (Signed)
We have given you the shingles vaccine.  Schedule the colonoscopy.  We have sent in lexapro 10 mg to take daily. Let us know in 1 month how this is doing. Do not take zoloft on the same day as lexapro but you can take zoloft today and lexapro tomorrow.

## 2022-07-31 NOTE — Progress Notes (Unsigned)
   Subjective:   Patient ID: Renee [name], female    DOB: 12-02-1960, 62 y.o.   MRN: [id number]  HPI The patient is here for physical.  PMH, [facility], social history reviewed and updated  Review of Systems  Objective:  Physical Exam  Vitals:   07/31/22 1111  BP: 120/80  Pulse: 70  Temp: 97.9 F (36.6 C)  TempSrc: Oral  SpO2: 99%  Weight: 134 lb (60.8 kg)  Height: '5\' 7"'$  (1.702 m)    Assessment & Plan:

## 2022-08-01 ENCOUNTER — Encounter: Payer: Self-pay | Admitting: Internal Medicine

## 2022-08-01 ENCOUNTER — Ambulatory Visit: Payer: Federal, State, Local not specified - PPO | Admitting: Allergy & Immunology

## 2022-08-01 DIAGNOSIS — I2510 Atherosclerotic heart disease of native coronary artery without angina pectoris: Secondary | ICD-10-CM | POA: Insufficient documentation

## 2022-08-01 NOTE — Assessment & Plan Note (Signed)
Overall stable with diet.

## 2022-08-01 NOTE — Assessment & Plan Note (Signed)
Checking TSH. Adjust as needed. Not on meds.

## 2022-08-01 NOTE — Assessment & Plan Note (Signed)
Flu shot declines. Covid-19 counseled. Shingrix 2nd overdue given today to complete series. Tetanus declines. Colonoscopy overdue advised to call and schedule. Mammogram up to date, pap smear up to date and dexa up to date. Counseled about sun safety and mole surveillance. Counseled about the dangers of distracted driving. Given 10 year screening recommendations.

## 2022-08-01 NOTE — Assessment & Plan Note (Signed)
Is now taking crestor 40 mg daily due to 92nd percentile on CT calcium scan and predominate plaque in LAD. Has seen cardiology. Checking lipid panel today.

## 2022-08-01 NOTE — Assessment & Plan Note (Signed)
Overall worsened than last year and lack of motivation. We will switch from zoloft 125 mg daily to lexapro 10 mg daily. In 1 month she will let us know how she is doing and we can increase lexapro to 20 mg daily if needed.

## 2022-08-01 NOTE — Assessment & Plan Note (Signed)
Takes omeprazole 20 mg daily prn otc and can continue.

## 2022-08-01 NOTE — Assessment & Plan Note (Signed)
Checking calcium and vitamin D and will repeat DEXA 3-5 years. Counseled about weight bearing exercise.

## 2022-08-06 ENCOUNTER — Telehealth: Payer: Self-pay | Admitting: Internal Medicine

## 2022-08-06 MED ORDER — METOPROLOL TARTRATE 100 MG PO TABS: 100.0000 mg | ORAL_TABLET | Freq: Once | ORAL | 0 refills | Status: DC

## 2022-08-06 NOTE — Telephone Encounter (Signed)
Paige- I have agreed to see this woman for dyspnea and sleep. Please see how soon we can work her in with me - ok to use held spot. Not emergency. Thanks.

## 2022-08-07 NOTE — Telephone Encounter (Signed)
ATC X1 LVM for patient to call back

## 2022-08-07 NOTE — Telephone Encounter (Signed)
Dr. Annamaria Boots just an Naz[place] I was able to get her scheduled Tuesday at 11:30

## 2022-08-08 ENCOUNTER — Ambulatory Visit (HOSPITAL_COMMUNITY)
Admission: RE | Admit: 2022-08-08 | Discharge: 2022-08-08 | Disposition: A | Discharge status: Home / Self Care | Payer: Federal, State, Local not specified - PPO | Source: Ambulatory Visit | Attending: Family | Admitting: Family

## 2022-08-08 ENCOUNTER — Other Ambulatory Visit (HOSPITAL_BASED_OUTPATIENT_CLINIC_OR_DEPARTMENT_OTHER): Payer: Self-pay

## 2022-08-08 DIAGNOSIS — I77819 Aortic ectasia, unspecified site: Secondary | ICD-10-CM | POA: Insufficient documentation

## 2022-08-08 DIAGNOSIS — I7121 Aneurysm of the ascending aorta, without rupture: Secondary | ICD-10-CM

## 2022-08-08 DIAGNOSIS — I712 Thoracic aortic aneurysm, without rupture, unspecified: Secondary | ICD-10-CM | POA: Diagnosis not present

## 2022-08-08 MED ORDER — IOHEXOL 350 MG/ML SOLN
75.0000 mL | Freq: Once | INTRAVENOUS | Status: AC | PRN
  Administered 2022-08-08: 75 mL via INTRAVENOUS

## 2022-08-10 NOTE — Progress Notes (Unsigned)
08/13/22- 61 yoF with concern of shortness of breath Medical problem list includes Depression, GERD, IBS, OSA (OAP/ Dr Ron Parker),   PFT 2019- possible minimal small airway obstruction w/o response to BD HST 05/17/20 ([facility]) AHI 8.9/ hr ECHO 05/12/20- EF 70-75%, otw wnl CTa chest 08/08/22- Narrative & Impression  CLINICAL DATA:  Evaluate for thoracic aortic aneurysm.   EXAM: CT ANGIOGRAPHY CHEST WITH CONTRAST   TECHNIQUE: Multidetector CT imaging of the chest was performed using the standard protocol during bolus administration of intravenous contrast. Multiplanar CT image reconstructions and MIPs were obtained to evaluate the vascular anatomy.   RADIATION DOSE REDUCTION: This exam was performed according to the departmental dose-optimization program which includes automated exposure control, adjustment of the mA and/or kV according to patient size and/or use of iterative reconstruction technique.   CONTRAST:  4m OMNIPAQUE IOHEXOL 350 MG/ML SOLN   COMPARISON:  Chest CT-09/14/2021; 08/21/2021   FINDINGS: Vascular Findings:   Stable mild fusiform ectasia of the ascending thoracic aorta with measurements as follows.   The thoracic aorta tapers to a normal caliber at the level of the aortic arch. There is a minimal amount of eccentric calcified atherosclerotic plaque within the descending thoracic aorta, not resulting in a hemodynamically significant stenosis. No evidence of thoracic aortic dissection or perivascular stranding on this nongated examination.   Conventional configuration of the aortic arch. There is a minimal amount of atherosclerotic plaque involving the origin of the left subclavian artery, not resulting in a hemodynamically significant stenosis. The major branch vessels of the aortic arch appear widely patent throughout their imaged courses.   Normal heart size. Coronary artery calcifications. No pericardial effusion though there is a trace amount of fluid within  the pericardial recess.   Although this examination was not tailored for the evaluation the pulmonary arteries, there are no discrete filling defects within the central pulmonary arterial tree to suggest central pulmonary embolism.   -------------------------------------------------------------   Thoracic aortic measurements:   SINOTUBULAR JUNCTION: 34 mm as measured in greatest oblique short axis coronal dimension.   PROXIMAL ASCENDING THORACIC AORTA: 39 mm as measured in greatest oblique short axis axial dimension (axial image 50, series 6) at the level of the main pulmonary artery and approximately 39 mm as measured in greatest oblique short axis coronal dimension (coronal image 49, series 8), unchanged compared to the 08/2021 examination   AORTIC ARCH: 27 mm as measured in greatest oblique short axis sagittal dimension.   PROXIMAL DESCENDING THORACIC AORTA: 25 mm as measured in greatest oblique short axis axial dimension at the level of the main pulmonary artery.   DISTAL DESCENDING THORACIC AORTA: 21 mm as measured in greatest oblique short axis axial dimension at the level of the diaphragmatic hiatus.   Review of the MIP images confirms the above findings.   -------------------------------------------------------------   Non-Vascular Findings:   Mediastinum/Lymph Nodes: No bulky mediastinal, hilar or axillary lymphadenopathy.   Lungs/Pleura: Minimal grossly symmetric biapical pleural-parenchymal thickening. Minimal dependent subpleural ground-glass atelectasis. No discrete focal airspace opacities. No pleural effusion or pneumothorax. The central pulmonary airways appear widely patent.   Punctate (sub 3 mm) right upper lobe pulmonary nodules are unchanged since the 08/2021 examination (images 25 and 37, series 6). No new or enlarging pulmonary nodules.   Upper abdomen: Arterial phase evaluation of the upper abdomen demonstrates a tiny splenule about the caudal  tip of the spleen.   Musculoskeletal: No acute or aggressive osseous abnormalities. Mild DDD of C7-T1 with disc space height loss, endplate  irregularity and sclerosis. Regional soft tissues appear normal. Normal appearance of the thyroid gland.   IMPRESSION: 1. 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 2. Coronary artery calcifications. Aortic Atherosclerosis (ICD10-I70.0). 3. Stable biapical pleuroparenchymal thickening, similar to the 08/2021 examination. 4. Punctate (sub 3 mm) right upper lobe pulmonary nodules are unchanged since the 08/2021 examination. This examination documents 1 year stability. Follow-up examination in 08/2023 would ensure 2 years of stability and thus a benign etiology.

## 2022-08-13 ENCOUNTER — Encounter: Payer: Self-pay | Admitting: Internal Medicine

## 2022-08-13 ENCOUNTER — Ambulatory Visit (INDEPENDENT_AMBULATORY_CARE_PROVIDER_SITE_OTHER): Payer: Federal, State, Local not specified - PPO | Admitting: Internal Medicine

## 2022-08-13 VITALS — BP 110/66 | HR 83 | Ht 68.0 in | Wt 134.4 lb

## 2022-08-13 DIAGNOSIS — I2510 Atherosclerotic heart disease of native coronary artery without angina pectoris: Secondary | ICD-10-CM | POA: Diagnosis not present

## 2022-08-13 DIAGNOSIS — G4733 Obstructive sleep apnea (adult) (pediatric): Secondary | ICD-10-CM

## 2022-08-13 DIAGNOSIS — R0602 Shortness of breath: Secondary | ICD-10-CM | POA: Diagnosis not present

## 2022-08-13 DIAGNOSIS — R0609 Other forms of dyspnea: Secondary | ICD-10-CM | POA: Diagnosis not present

## 2022-08-13 DIAGNOSIS — I2584 Coronary atherosclerosis due to calcified coronary lesion: Secondary | ICD-10-CM

## 2022-08-13 MED ORDER — BREZTRI AEROSPHERE 160-9-4.8 MCG/ACT IN AERO: 2.0000 | INHALATION_SPRAY | Freq: Two times a day (BID) | RESPIRATORY_TRACT | 0 refills | Status: DC

## 2022-08-13 NOTE — Patient Instructions (Addendum)
Order- Schedule overnight oximetry     dx dyspnea on exertion  Order- Schedule PFT     dx dyspnea on exertion  Order- sample x 2  Breztri inhaler        inhale 2 puffs, then rinse mouth, twice daily      Try this instead of Symbicort  Order- schedule cardiopulmonary exercise stress test      dx dyspnea on exertion

## 2022-08-13 NOTE — Assessment & Plan Note (Signed)
Followed by cardiology.  She has a high ejection fraction and no evidence of congestive heart failure.  She does not seem to be describing peripheral claudication.

## 2022-08-13 NOTE — Assessment & Plan Note (Signed)
She indicates this is relatively stable and present over 25 years which makes a significant medical condition unlikely. Plan-overnight oximetry, cardiopulmonary exercise stress test, sample Breztri for trial instead of Symbicort, update PFT for comparison.

## 2022-08-13 NOTE — Assessment & Plan Note (Signed)
This is mild and she indicates she is inclined to wait for now on following through with the oral appliance from Dr. Ron Parker.

## 2022-08-13 NOTE — Addendum Note (Signed)
Addended by: Meredith Staggers A on: 08/13/2022 02:36 PM   Modules accepted: Orders

## 2022-08-29 ENCOUNTER — Ambulatory Visit: Payer: Federal, State, Local not specified - PPO | Admitting: Family Medicine

## 2022-09-01 ENCOUNTER — Other Ambulatory Visit: Payer: Self-pay | Admitting: Allergy & Immunology

## 2022-09-05 ENCOUNTER — Encounter (HOSPITAL_COMMUNITY): Payer: Federal, State, Local not specified - PPO

## 2022-09-07 ENCOUNTER — Other Ambulatory Visit (HOSPITAL_BASED_OUTPATIENT_CLINIC_OR_DEPARTMENT_OTHER): Payer: Self-pay | Admitting: General Practice

## 2022-09-11 ENCOUNTER — Other Ambulatory Visit (HOSPITAL_BASED_OUTPATIENT_CLINIC_OR_DEPARTMENT_OTHER): Payer: Self-pay | Admitting: General Practice

## 2022-09-11 DIAGNOSIS — J3489 Other specified disorders of nose and nasal sinuses: Secondary | ICD-10-CM | POA: Diagnosis not present

## 2022-09-11 DIAGNOSIS — R0609 Other forms of dyspnea: Secondary | ICD-10-CM | POA: Diagnosis not present

## 2022-09-11 DIAGNOSIS — J309 Allergic rhinitis, unspecified: Secondary | ICD-10-CM | POA: Diagnosis not present

## 2022-09-11 DIAGNOSIS — G4733 Obstructive sleep apnea (adult) (pediatric): Secondary | ICD-10-CM | POA: Diagnosis not present

## 2022-09-13 ENCOUNTER — Other Ambulatory Visit: Payer: Self-pay | Admitting: Internal Medicine

## 2022-09-19 DIAGNOSIS — I788 Other diseases of capillaries: Secondary | ICD-10-CM | POA: Diagnosis not present

## 2022-09-19 DIAGNOSIS — L821 Other seborrheic keratosis: Secondary | ICD-10-CM | POA: Diagnosis not present

## 2022-09-19 DIAGNOSIS — D485 Neoplasm of uncertain behavior of skin: Secondary | ICD-10-CM | POA: Diagnosis not present

## 2022-09-19 DIAGNOSIS — L820 Inflamed seborrheic keratosis: Secondary | ICD-10-CM | POA: Diagnosis not present

## 2022-09-19 DIAGNOSIS — Z85828 Personal history of other malignant neoplasm of skin: Secondary | ICD-10-CM | POA: Diagnosis not present

## 2022-09-19 DIAGNOSIS — L570 Actinic keratosis: Secondary | ICD-10-CM | POA: Diagnosis not present

## 2022-09-19 DIAGNOSIS — D1801 Hemangioma of skin and subcutaneous tissue: Secondary | ICD-10-CM | POA: Diagnosis not present

## 2022-10-02 ENCOUNTER — Encounter (HOSPITAL_COMMUNITY): Payer: Federal, State, Local not specified - PPO

## 2022-10-03 ENCOUNTER — Encounter: Payer: Self-pay | Admitting: Internal Medicine

## 2022-10-03 ENCOUNTER — Ambulatory Visit: Payer: Federal, State, Local not specified - PPO | Admitting: Allergy & Immunology

## 2022-10-14 NOTE — Progress Notes (Unsigned)
[name] [address] [place] [address] Phone: [phone] Subjective:   Renee [name], am serving as a scribe for Dr. Antoine Primas.  I'm seeing this patient by the request  of:  Myrlene Broker, MD  CC: Finger and elbow pain  YIA:[id number]  07/18/2022 Patient does have what appears to be a small index fracture noted, seems to be more of a small avulsion and does go intra-articular. I do believe that buddy taping the toe could be beneficial for her. Will try this and see how she responds. Follow-up with me again in 2 to 3 weeks   Patient given injection and tolerated the procedure well. Hopefully this will make some improvement. We discussed with patient to continue some of the exercises and compression. Chronic problem at the moment that did seem to be worsening. Follow-up again in 6 to 8 weeks   Updated 10/16/2022 Renee [name] is a 62 y.o. female coming in with complaint of finger and elbow pain patient was to follow-up with [place] sooner but was lost to follow-up after having an aneurysm of the ascending aorta. Bilateral knee pain that started back about a month ago. Thinks it may come from changing shoes. Elbow was doing well until the end of March and pain went back to original pain. Hasn't followed recommendation for buddy tapping, life happened. Still in some discomfort.      Past Medical History:  Diagnosis Date   Abnormal chest x-ray    CXR c/w COPD. Full PFTs '11 - normal   Allergy    seasonal   Anxiety    Arthritis    both knees, post traumatic - neck   Cataract    removed bilateral   Depression    with anxiety.   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   Migraines    during teenage years   MVP (mitral valve prolapse)    last 2D echo approx '10   Osteopenia    DEXA 03/2012: -1.0   Past Surgical History:   Procedure Laterality Date   APPENDECTOMY     BUNIONECTOMY WITH HAMMERTOE RECONSTRUCTION Left 07/06/13   [facility]   CARDIAC CATHETERIZATION  2006   normal study   CARPAL TUNNEL RELEASE  2011   right hand   HYSTEROPLASTY REPAIR OF UTERINE ANOMALY     2003 - laproscopically; 2006 - laparotomy   LAPAROSCOPIC ENDOMETRIOSIS FULGURATION     1996 and 2002   MANDIBLE RECONSTRUCTION     NASAL SEPTUM SURGERY  11/2012   [facility] FINGER RELEASE  2011   R thimb and ring finger   TRIGGER FINGER RELEASE  02/04/2012   [place], in office   Social History   Socioeconomic History   Marital status: Married    Spouse name: Not on file   Number of children: 0   Years of education: 16   Highest education level: Not on file  Occupational History   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   Married '94.   Denies any physical or sexual abuse.   Family in [place]: parents and sister.   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   Has smoke alarms, wears seatbelt at all times, uses helmut when appropriate, firearms present in the home, uses herbal remedies, caffeine- 2-3 per day.   No regular exercise.   Social Determinants of Health   Financial Resource Strain: Not on file  Food Insecurity: Not on file  Transportation Needs: Not on file  Physical Activity: Not on file  Stress: Not on file  Social Connections: Not on file   Allergies  Allergen Reactions   Codeine Other (See Comments)    Hot flashes/passed out   Pollen Extract Other (See Comments)    drainage   Family History   Problem Relation Age of Onset   Allergic rhinitis Mother    Arthritis Mother        DOB 65   Cancer Mother        Thyroid cancer   Hypertension Mother    Atrial fibrillation Mother    Melanoma Mother    Breast cancer Mother    Arthritis Father        DOB 92   Melanoma Father    Allergic rhinitis Sister    Colon polyps Neg Hx    Rectal cancer Neg Hx    Stomach cancer Neg Hx      Current Outpatient Medications (Cardiovascular):    rosuvastatin (CRESTOR) 40 MG tablet, TAKE 1 TABLET BY MOUTH EVERY DAY  Current Outpatient Medications (Respiratory):    albuterol (VENTOLIN HFA) 108 (90 Base) MCG/ACT inhaler, Inhale 2 puffs into the lungs every 6 (six) hours as needed for wheezing or shortness of breath.   Budeson-Glycopyrrol-Formoterol (BREZTRI AEROSPHERE) 160-9-4.8 MCG/ACT AERO, Inhale 2 puffs into the lungs in the morning and at bedtime.   budesonide-formoterol (SYMBICORT) 80-4.5 MCG/ACT inhaler, Inhale 2 puffs into the lungs daily as needed.   fexofenadine (ALLEGRA) 180 MG tablet, Take 180 mg by mouth daily as needed for allergies or rhinitis.   fluticasone (FLONASE) 50 MCG/ACT nasal spray, SPRAY 2 SPRAYS INTO EACH NOSTRIL EVERY DAY   montelukast (SINGULAIR) 10 MG tablet, TAKE 1 TABLET BY MOUTH EVERYDAY AT BEDTIME   Olopatadine-Mometasone (RYALTRIS) 665-25 MCG/ACT SUSP, Place 1 spray into the nose in the morning and at bedtime.  Current Outpatient Medications (Analgesics):    aspirin EC 81 MG tablet, Take 1 tablet (81 mg total) by mouth daily. Swallow whole.   Current Outpatient Medications (Other):    Cholecalciferol (VITAMIN D3) 50 MCG (2000 UT) capsule, Take 2,000 Units by mouth daily.   DIGESTIVE ENZYMES PO, Take 1 tablet by mouth daily as needed.   escitalopram (LEXAPRO) 10 MG tablet, Take 1 tablet (10 mg total) by mouth daily.   magnesium oxide (MAG-OX) 400 (240 Mg) MG tablet, Take 400 mg by mouth daily.   metroNIDAZOLE (METROCREAM) 0.75 % cream, Apply 1 application  topically 2 (two) times daily.   Omega-3 Fatty Acids (FISH OIL TRIPLE STRENGTH) 1400 MG CAPS, Take by mouth daily as needed.   omeprazole (PRILOSEC) 20 MG capsule, Take 20 mg by mouth daily as needed.   Plant Sterols and Stanols (CHOLESTOFF PLUS PO), Take by mouth.   Probiotic Product (PROBIOTIC DAILY PO), Take 1 tablet by mouth daily.   sertraline (  ZOLOFT) 50 MG tablet, TAKE 2.5 TABLETS (125 MG TOTAL) BY MOUTH DAILY.   simethicone (MYLICON) 80 MG chewable tablet, Chew 80 mg by mouth every 6 (six) hours as needed for flatulence.   Vitamin D, Ergocalciferol, (DRISDOL) 1.25 MG (50000 UNIT) CAPS capsule, Take 1 capsule (50,000 Units total) by mouth every 7 (seven) days.   vitamin E 180 MG (400 UNITS) capsule, Take 400 Units by mouth daily.   Reviewed prior external information including notes and imaging from  primary care provider As well as notes that were available from care everywhere and other healthcare systems.  Past medical history, social, surgical and family history all reviewed in electronic medical record.  No pertanent information unless stated regarding to the chief complaint.   Review of Systems:  No headache, visual changes, nausea, vomiting, diarrhea, constipation, dizziness, abdominal pain, skin rash, fevers, chills, night sweats, weight loss, swollen lymph nodes, body aches, joint swelling, chest pain, shortness of breath, mood changes. POSITIVE muscle aches  Objective  Blood pressure (!) 130/90, pulse 80, height  (1.727 m), weight 138 lb (62.6 kg), SpO2 98 %.   General: No apparent distress alert and oriented x3 mood and affect normal, dressed appropriately.  HEENT: Pupils equal, extraocular movements intact  Respiratory: Patient's speak in full sentences and does not appear short of breath  Cardiovascular: No lower extremity edema, non tender, no erythema  Right elbow does have a positive Tinel's sign noted.  Some pain with resistance. Shoulder: Right Inspection  reveals no abnormalities, atrophy or asymmetry. Palpation is normal with no tenderness over AC joint or bicipital groove. ROM is full in all planes passively. Rotator cuff strength normal throughout. signs of impingement with positive Neer and Hawkin's tests, but negative empty can sign. Speeds and Yergason's tests normal. No labral pathology noted with negative Obrien's, negative clunk and good stability. Normal scapular function observed. No painful arc and no drop arm sign. No apprehension sign  MSK US performed of: Right This study was ordered, performed, and interpreted by Terrilee Files D.O.  Shoulder:   Supraspinatus:  Appears normal on long and transverse views, Bursal bulge seen with shoulder abduction on impingement view. Infraspinatus:  Appears normal on long and transverse views. Significant increase in Doppler flow Subscapularis:  Appears normal on long and transverse views. Positive bursa Teres Minor:  Appears normal on long and transverse views. AC joint:  Capsule undistended, no geyser sign. Glenohumeral Joint:  Appears normal without effusion. Glenoid Labrum:  Intact without visualized tears. Biceps Tendon:  Appears normal on long and transverse views, no fraying of tendon, tendon located in intertubercular groove, no subluxation with shoulder internal or external rotation.  Impression: Subacromial bursitis  Procedure: Real-time Ultrasound Guided Injection of right glenohumeral joint Device: GE Logiq E  Ultrasound guided injection is preferred based studies that show increased duration, increased effect, greater accuracy, decreased procedural pain, increased response rate with ultrasound guided versus blind injection.  Verbal informed consent obtained.  Time-out conducted.  Noted no overlying erythema, induration, or other signs of local infection.  Skin prepped in a sterile fashion.  Local anesthesia: Topical Ethyl chloride.  With sterile technique and under real time  ultrasound guidance:  Joint visualized.  23g 1  inch needle inserted posterior approach. Pictures taken for needle placement. Patient did have injection of  2 cc of 0.5% Marcaine, and 1.0 cc of Kenalog 40 mg/dL. Completed without difficulty  Pain immediately resolved suggesting accurate placement of the medication.  Advised to call if  fevers/chills, erythema, induration, drainage, or persistent bleeding.  Impression: Technically successful ultrasound guided injection.  Procedure: Real-time Ultrasound Guided Injection of right ulnar nerve sheath at elbow Device: GE Logiq Q7 Ultrasound guided injection is preferred based studies that show increased duration, increased effect, greater accuracy, decreased procedural pain, increased response rate, and decreased cost with ultrasound guided versus blind injection.  Verbal informed consent obtained.  Time-out conducted.  Noted no overlying erythema, induration, or other signs of local infection.  Skin prepped in a sterile fashion.  Local anesthesia: Topical Ethyl chloride.  With sterile technique and under real time ultrasound guidance: With a 25-gauge half inch needle injected with 0.5 cc of 0.5% Marcaine and 0.5 cc of Kenalog 40 mg/mL Completed without difficulty  Pain immediately resolved suggesting accurate placement of the medication.  Advised to call if fevers/chills, erythema, induration, drainage, or persistent bleeding.  Impression: Technically successful ultrasound guided injection.   Impression and Recommendations:     The above documentation has been reviewed and is accurate and complete Judi Saa, DO

## 2022-10-15 ENCOUNTER — Ambulatory Visit: Payer: Federal, State, Local not specified - PPO | Admitting: Internal Medicine

## 2022-10-16 ENCOUNTER — Ambulatory Visit (INDEPENDENT_AMBULATORY_CARE_PROVIDER_SITE_OTHER): Payer: Federal, State, Local not specified - PPO | Admitting: Family Medicine

## 2022-10-16 ENCOUNTER — Other Ambulatory Visit: Payer: Self-pay

## 2022-10-16 ENCOUNTER — Encounter: Payer: Self-pay | Admitting: Family Medicine

## 2022-10-16 VITALS — BP 130/90 | HR 80 | Ht 68.0 in | Wt 138.0 lb

## 2022-10-16 DIAGNOSIS — M25521 Pain in right elbow: Secondary | ICD-10-CM

## 2022-10-16 DIAGNOSIS — M542 Cervicalgia: Secondary | ICD-10-CM

## 2022-10-16 DIAGNOSIS — M75111 Incomplete rotator cuff tear or rupture of right shoulder, not specified as traumatic: Secondary | ICD-10-CM

## 2022-10-16 DIAGNOSIS — G5621 Lesion of ulnar nerve, right upper limb: Secondary | ICD-10-CM

## 2022-10-16 MED ORDER — VITAMIN D (ERGOCALCIFEROL) 1.25 MG (50000 UNIT) PO CAPS: 50000.0000 [IU] | ORAL_CAPSULE | ORAL | 0 refills | Status: DC

## 2022-10-16 NOTE — Assessment & Plan Note (Signed)
Chronic problem with repeat injection today.  Discussed again about the possibility of advanced imaging.  Right now outpatient would not have any change in medical management secondary to once again moving her elderly parents out of the home and getting it ready for sale.  At this time we repeated the injection to see if this will help.  Follow-up with me again in 6 to 8 weeks

## 2022-10-16 NOTE — Assessment & Plan Note (Signed)
Patient previously did have interstitial tearing of the rotator cuff but did heal appropriately previously.  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  To be more of a subacromial bursitis.  Injection given today at the moment.  Home exercises discussed and will start with formal physical therapy to see if dry needling diabetes could be more beneficial.  I do think it would be a good idea to set this time aside send patient also has time to work out on her own.  Follow-up with me again in 6 to 8 weeks otherwise.

## 2022-10-16 NOTE — Patient Instructions (Signed)
Britt PT  Injections today Stay active Take time for yourself See me in 6-8 weeks

## 2022-10-23 ENCOUNTER — Ambulatory Visit (HOSPITAL_COMMUNITY): Payer: Federal, State, Local not specified - PPO | Attending: Internal Medicine

## 2022-10-23 DIAGNOSIS — R0609 Other forms of dyspnea: Secondary | ICD-10-CM

## 2022-11-07 ENCOUNTER — Ambulatory Visit: Payer: Federal, State, Local not specified - PPO | Admitting: Allergy & Immunology

## 2022-11-07 ENCOUNTER — Other Ambulatory Visit: Payer: Self-pay

## 2022-11-07 ENCOUNTER — Encounter: Payer: Self-pay | Admitting: Allergy & Immunology

## 2022-11-07 VITALS — BP 120/78 | HR 73 | Temp 98.7°F | Resp 18 | Ht 66.54 in | Wt 135.4 lb

## 2022-11-07 DIAGNOSIS — J301 Allergic rhinitis due to pollen: Secondary | ICD-10-CM

## 2022-11-07 DIAGNOSIS — B999 Unspecified infectious disease: Secondary | ICD-10-CM | POA: Diagnosis not present

## 2022-11-07 DIAGNOSIS — J4540 Moderate persistent asthma, uncomplicated: Secondary | ICD-10-CM

## 2022-11-07 DIAGNOSIS — R0602 Shortness of breath: Secondary | ICD-10-CM

## 2022-11-07 MED ORDER — SULFAMETHOXAZOLE-TRIMETHOPRIM 800-160 MG PO TABS: 1.0000 | ORAL_TABLET | Freq: Two times a day (BID) | ORAL | 0 refills | Status: AC

## 2022-11-07 MED ORDER — SYMBICORT 80-4.5 MCG/ACT IN AERO: 2.0000 | INHALATION_SPRAY | Freq: Two times a day (BID) | RESPIRATORY_TRACT | 2 refills | Status: AC | PRN

## 2022-11-07 MED ORDER — ALBUTEROL SULFATE HFA 108 (90 BASE) MCG/ACT IN AERS: 2.0000 | INHALATION_SPRAY | RESPIRATORY_TRACT | 1 refills | Status: AC | PRN

## 2022-11-07 NOTE — Patient Instructions (Addendum)
1. SOB (shortness of breath) - Breathing test not done today. - I think it is fine to go back to the Symbicort.  - We are going to get some labs to try to figure out whether this breathing issue is related to other serious causes of difficult to control asthma (to see whether this is related to a genetic condition that predisposes you to things like COPD or other serious causes of asthma).  - Daily controller medication(s): Symbicort 80/4.3mcg two puffs twice daily with spacer AS NEEDED - Prior to physical activity: albuterol 2 puffs 10-15 minutes before physical activity. - Rescue medications: albuterol 4 puffs every 4-6 hours as needed - Asthma control goals:  * Full participation in all desired activities (may need albuterol before activity) * Albuterol use two time or less a week on average (not counting use with activity) * Cough interfering with sleep two time or less a month * Oral steroids no more than once a year * No hospitalizations  2. 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 - I am going to send my note to Dr. Marene Lenz to keep her in the loop. - Continue with: Allegra (fexofenadine) 180mg  tablet once daily (alternate every 3 months) - We are going to get an immune workup today. - We will obtain some screening labs to evaluate your immune system.  - Labs to evaluate the quantitative [facility]) aspects of your immune system: IgG/IgA/IgM, CBC with differential - Labs to evaluate the qualitative (HOW WELL THEY WORK) aspects of your immune system: CH50, Pneumococcal titers, Tetanus titers, Diphtheria titers - We may consider immunizations with Pneumovax and Tdap to challenge your immune system, and then obtain repeat titers in 4-6 weeks.   3. Gastroesophageal reflux disease - Continue with dietary changes.   4. Return in about 3 months (around 02/07/2023).    Please inform us of any  Emergency Department visits, hospitalizations, or changes in symptoms. Call us before going to the ED for breathing or allergy symptoms since we might be able to fit you in for a sick visit. Feel free to contact us anytime with any questions, problems, or concerns.  It was a pleasure to see you today!  Websites that have reliable patient information: 1. American Academy of Asthma, Allergy, and Immunology: www.aaaai.org 2. Food Allergy Research and Education (FARE): foodallergy.org 3. Mothers of Asthmatics: http://www.asthmacommunitynetwork.org 4. American College of Allergy, Asthma, and Immunology: www.acaai.org   COVID-19 Vaccine Information can be found at: [email or web address] For questions related to vaccine distribution or appointments, please email [email or web address] or call [phone].   We realize that you might be concerned about having an allergic reaction to the COVID19 vaccines. To help with that concern, WE ARE OFFERING THE COVID19 VACCINES IN OUR OFFICE! Ask the front desk for dates!     "Like" [place] on Facebook and Instagram for our latest updates!      A healthy democracy works best when [employer] participate! Make sure you are registered to vote! If you have moved or changed any of your contact information, you will need to get this updated before voting!  In some cases, you MAY be able to register to vote online: [email or web address]

## 2022-11-07 NOTE — Progress Notes (Signed)
FOLLOW UP  Date of Service/Encounter:  11/07/22   Assessment:   SOB (shortness of breath) - with improvement with PRN use of Symbicort   Seasonal allergic rhinitis due to pollen (grasses) - with clear perennial symptoms  Sinusitis - with history of Staphylococcus aureus (prescribing another round of Bactrim)   Gastroesophageal reflux disease - controlled with dietary changes only  History of multiple traumas, including struggles with IVF as well as dealing with alcoholism in her husband  Plan/Recommendations:   1. SOB (shortness of breath) - Breathing test not done today. - I think it is fine to go back to the Symbicort.  - We are going to get some labs to try to figure out whether this breathing issue is related to other serious causes of difficult to control asthma (to see whether this is related to a genetic condition that predisposes you to things like COPD or other serious causes of asthma).  - Daily controller medication(s): Symbicort 80/4.86mcg two puffs twice daily with spacer  - Prior to physical activity: albuterol 2 puffs 10-15 minutes before physical activity. - Rescue medications: albuterol 4 puffs every 4-6 hours as needed - Asthma control goals:  * Full participation in all desired activities (may need albuterol before activity) * Albuterol use two time or less a week on average (not counting use with activity) * Cough interfering with sleep two time or less a month * Oral steroids no more than once a year * No hospitalizations  2. 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 - I am going to send my note to Dr. Marene Lenz to keep her in the loop. - Continue with: Allegra (fexofenadine) 180mg  tablet once daily (alternate every 3 months) - We are going to get an immune workup today. - We will obtain some screening labs to evaluate your immune system.  - Labs to evaluate  the quantitative [facility]) aspects of your immune system: IgG/IgA/IgM, CBC with differential - Labs to evaluate the qualitative (HOW WELL THEY WORK) aspects of your immune system: CH50, Pneumococcal titers, Tetanus titers, Diphtheria titers - We may consider immunizations with Pneumovax and Tdap to challenge your immune system, and then obtain repeat titers in 4-6 weeks.   3. Gastroesophageal reflux disease - Continue with dietary changes.   4. Return in about 3 months (around 02/07/2023).   Subjective:   Renee [name] is a 62 y.o. 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  [name] is a 62 y.o. female presenting for a follow up visit.  She was last seen in October 2023.  At that time, her breathing test look stable.  We continue with Symbicort 80 mcg 2 puffs twice daily.  For her rhinitis, she seems to have everything under good control.  We continue with Allegra, alternating to a different antihistamine every 3 months.  We also continue with montelukast.  We gave her a sample of Ryaltris to use as well.  We sent her to see Dr. Marene Lenz.   Since the last  visit, she has done well. She continues to have symptoms despite everything that she ha sbeen through, although it seems a bit better.   Asthma/Respiratory Symptom History: She is currently on the [place] . She did go to see Dr Maple Hudson, who recommended the [facility] over the Symbicort. She did not notice any improvement with the [facility], so she is going to go back to Symbicort instead one her current inhaler is used up. He did not have any other ideas regarding her symptoms. She continues to have episodes where she becomes short of breath - these are responsive to albuterol, at least to a certain extent.  She had an exercise stress test that was normal.    Allergic Rhinitis Symptom History: She did see Dr. Marene Lenz who did not find anything concerning. She does have the right side of her ear that drains randomly. She did have a bacterial infection on the right side; she was treated with  an antibiotic for a couple of weeks. She does not have an appt with Dr. Marene Lenz. She finished the antibiotic weeks ago. She had a "light growth of Staph". She was prescribed Bactroban and Bactrim. She never felt that the Singulair helped. She requests to stop that since she did not notice an improvement. She has tried a number of nose sprays; these do not seem to work. She uses nasal saline and Flonase which does not seem to do much.   She completed the Bactrim in March and then during April her symptoms were improved. She tells me that she is almost back to where it was. She does not otherwise does not get antibiotics at all. She denies ear infections or pneumonias. She has not had a skin infection. There is no history of immunodeficiencies in the family. She has never had an immune workup.   Otherwise, there have been no changes to her past medical history, surgical history, family history, or social history.    Review of Systems  Constitutional: Negative.  Negative for chills, fever, malaise/fatigue and weight loss.   HENT: Negative.  Negative for congestion, ear discharge, ear pain and sinus pain.   Eyes:  Negative for pain, discharge and redness.  Respiratory:  Positive for cough. Negative for sputum production, shortness of breath, wheezing and stridor.   Cardiovascular: Negative.  Negative for chest pain and palpitations.  Gastrointestinal:  Negative for abdominal pain, constipation, diarrhea, heartburn, nausea and vomiting.  Skin: Negative.  Negative for itching and rash.  Neurological:  Negative for dizziness and headaches.  Endo/Heme/Allergies:  Positive for environmental allergies. Does not bruise/bleed easily.       Objective:   Blood pressure 120/78, pulse 73, temperature 98.7 F (37.1 C), resp. rate 18, height 5' 6.54" (1.69 m), weight 135 lb 6.4 oz (61.4 kg), SpO2 97 %. Body mass index is 21.5 kg/m.    Physical Exam Vitals reviewed.  Constitutional:      Appearance: She is well-developed.     Comments: Very pleasant and talkative.  HENT:  Head: Normocephalic and atraumatic.     Right Ear: Tympanic membrane, ear canal and external ear normal. No drainage, swelling or tenderness. Tympanic membrane is not injected, scarred, erythematous, retracted or bulging.     Left Ear: Tympanic membrane, ear canal and external ear normal. No drainage, swelling or tenderness. Tympanic membrane is not injected, scarred, erythematous, retracted or bulging.     Nose: No nasal deformity, septal deviation, mucosal edema or rhinorrhea.     Right Turbinates: Enlarged, swollen and pale.     Left Turbinates: Enlarged, swollen and pale.     Right Sinus: No maxillary sinus tenderness or frontal sinus tenderness.     Left Sinus: No maxillary sinus tenderness or frontal sinus tenderness.     Comments: Congestion is much worse on the left versus the right.  No epistaxis.    Mouth/Throat:     Mouth: Mucous membranes are not pale and not dry.     Pharynx: Uvula midline.  Eyes:     General:        Right  eye: No discharge.        Left eye: No discharge.     Conjunctiva/sclera: Conjunctivae normal.     Right eye: Right conjunctiva is not injected. No chemosis.    Left eye: Left conjunctiva is not injected. No chemosis.    Pupils: Pupils are equal, round, and reactive to light.  Cardiovascular:     Rate and Rhythm: Normal rate and regular rhythm.     Heart sounds: Normal heart sounds.  Pulmonary:     Effort: Pulmonary effort is normal. No tachypnea, accessory muscle usage or respiratory distress.     Breath sounds: Normal breath sounds. No wheezing, rhonchi or rales.     Comments: Moving air well in all lung fields.  No increased work of breathing.  Crackles. Chest:     Chest wall: No tenderness.  Abdominal:     Tenderness: There is no abdominal tenderness. There is no guarding or rebound.  Lymphadenopathy:     Head:     Right side of head: No submandibular, tonsillar or occipital adenopathy.     Left side of head: No submandibular, tonsillar or occipital adenopathy.     Cervical: No cervical adenopathy.  Skin:    Coloration: Skin is not pale.     Findings: No abrasion, erythema, petechiae or rash. Rash is not papular, urticarial or vesicular.  Neurological:     Mental Status: She is alert.      Diagnostic studies:    Spirometry: results normal (FEV1: 2.76/105%, FVC: 3.83/114%, FEV1/FVC: 72%).    Spirometry consistent with normal pattern.    Allergy Studies: labs sent instead        Malachi Bonds, MD  [facility]

## 2022-11-08 LAB — STREP PNEUMONIAE 23 SEROTYPES IGG

## 2022-11-08 LAB — ASPERGILLUS PRECIPITINS

## 2022-11-08 LAB — IGG, IGA, IGM: IgG (Immunoglobin G), Serum: 1139 mg/dL (ref 586–1602)

## 2022-11-09 LAB — STREP PNEUMONIAE 23 SEROTYPES IGG

## 2022-11-09 LAB — ASPERGILLUS PRECIPITINS

## 2022-11-09 LAB — IGE

## 2022-11-09 LAB — DIPHTHERIA / TETANUS ANTIBODY PANEL

## 2022-11-11 LAB — IGG, IGA, IGM
IgA/Immunoglobulin A, Serum: 158 mg/dL (ref 87–352)
IgM (Immunoglobulin M), Srm: 64 mg/dL (ref 26–217)

## 2022-11-11 LAB — STREP PNEUMONIAE 23 SEROTYPES IGG

## 2022-11-11 LAB — COMPLEMENT, TOTAL: Compl, Total (CH50): >60 U/mL (ref >41)

## 2022-11-11 LAB — ANCA TITERS: P-ANCA: <1:20 {titer}

## 2022-11-11 LAB — ASPERGILLUS PRECIPITINS: Aspergillus glaucus IgG: NEGATIVE

## 2022-11-12 LAB — ASPERGILLUS PRECIPITINS: Aspergillus terreus IgG: NEGATIVE

## 2022-11-12 LAB — STREP PNEUMONIAE 23 SEROTYPES IGG

## 2022-11-12 LAB — ANCA TITERS
Atypical pANCA: <1:20 {titer}
C-ANCA: <1:20 {titer}

## 2022-11-13 ENCOUNTER — Encounter: Payer: Self-pay | Admitting: Internal Medicine

## 2022-11-13 LAB — ALPHA-1-ANTITRYPSIN: A-1 Antitrypsin: 147 mg/dL (ref 101–187)

## 2022-11-13 LAB — ASPERGILLUS PRECIPITINS
A.Fumigatus #1 Abs: NEGATIVE
Aspergillus nidulans IgG: NEGATIVE

## 2022-11-13 LAB — DIPHTHERIA / TETANUS ANTIBODY PANEL: Diphtheria Ab: >3 IU/mL (ref <0.10)

## 2022-11-19 DIAGNOSIS — H52203 Unspecified astigmatism, bilateral: Secondary | ICD-10-CM | POA: Diagnosis not present

## 2022-11-19 DIAGNOSIS — H1789 Other corneal scars and opacities: Secondary | ICD-10-CM | POA: Diagnosis not present

## 2022-11-19 DIAGNOSIS — Z961 Presence of intraocular lens: Secondary | ICD-10-CM | POA: Diagnosis not present

## 2022-12-01 ENCOUNTER — Encounter: Payer: Self-pay | Admitting: Family Medicine

## 2022-12-04 ENCOUNTER — Ambulatory Visit: Payer: Federal, State, Local not specified - PPO | Admitting: *Deleted

## 2022-12-04 ENCOUNTER — Ambulatory Visit: Payer: Federal, State, Local not specified - PPO | Admitting: Family Medicine

## 2022-12-04 VITALS — Ht 67.0 in | Wt 138.0 lb

## 2022-12-04 NOTE — Progress Notes (Signed)
Pt's name and DOB verified at the beginning of the pre-visit.  Pt denies any difficulty with ambulating,sitting, laying down or rolling side to side Gave both [facility] main # and MD on call # prior to instructions.  No egg or soy allergy known to patient  No issues known to pt with past sedation with any surgeries or procedures Pt denies having issues being intubated Patient denies ever being intubated Pt has no issues moving head neck or swallowing . Visit by phone Pt states weight is 138 lb Pt states she has been having issues with reflux and occasionally having issues with feelig like it's hard to catch her breath. She has had testing with Cardio and Pulm MD's and workus and no results as to why. RN discussed with pt that she could see someone in the office to discuss setting a Endo/colon and that she would have to do that in order to schedule it. Pt decided to go with OV and one was made for //22/24 @ 2pm with Doug Sou. Instructed her to come 15v min prior to visit.

## 2022-12-16 ENCOUNTER — Ambulatory Visit: Payer: Federal, State, Local not specified - PPO | Admitting: Internal Medicine

## 2022-12-25 ENCOUNTER — Encounter: Payer: Federal, State, Local not specified - PPO | Admitting: Internal Medicine

## 2023-01-29 ENCOUNTER — Encounter: Payer: Self-pay | Admitting: Family Medicine

## 2023-01-29 ENCOUNTER — Telehealth (INDEPENDENT_AMBULATORY_CARE_PROVIDER_SITE_OTHER): Payer: Federal, State, Local not specified - PPO | Admitting: Family Medicine

## 2023-01-29 VITALS — HR 62 | Temp 98.0°F | Ht 67.0 in | Wt 138.0 lb

## 2023-01-29 DIAGNOSIS — U071 COVID-19: Secondary | ICD-10-CM | POA: Diagnosis not present

## 2023-01-29 DIAGNOSIS — R059 Cough, unspecified: Secondary | ICD-10-CM | POA: Diagnosis not present

## 2023-01-29 NOTE — Progress Notes (Unsigned)
Patient ID: Renee [name], female   DOB: [birth year], 62 y.o.   MRN: [id number]   Virtual Visit via Video Note  I connected with [name] on 01/29/23 at  5:00 PM EDT by a video enabled telemedicine application and verified that I am speaking with the correct person using two identifiers.  Location patient: home Location provider:work or home office Persons participating in the virtual visit: patient, provider  I discussed the limitations of evaluation and management by telemedicine and the availability of in person appointments. The patient expressed understanding and agreed to proceed.   HPI: [name] tested positive last week for COVID.  She states her symptoms actually started week ago Monday.  She had positive test last Thursday.  She felt poorly over the weekend with fatigue, congestion, cough.  Low-grade fever over the weekend on Saturday and "Sunday.  Monday she woke up feeling somewhat better but then yesterday morning when she woke up she noticed little bit of rapid breathing which was transient with profound fatigue and some dizziness.  No chest pains.  No pleuritic pain.  No recurrent fever.  In fact, she took temperature of 94 yesterday and 1 below 96 today.  Pulse oximetry initially 89% but then went up to 94%.  Currently 97%.  No lower extremity edema.  No history of DVT or PE.  Has somewhat poor appetite.   ROS: See pertinent positives and negatives per HPI.  Past Medical History:  Diagnosis Date   Abnormal chest x-ray    CXR c/w COPD. Full PFTs '11 - normal   Allergy    seasonal   Anxiety    Arthritis    both knees, post traumatic - neck   Cataract    removed bilateral   Depression    with anxiety.   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   Migraines    during teenage years   MVP (mitral valve prolapse)    last 2D echo  approx '10   Osteopenia    DEXA 03/2012: -1.0    Past Surgical History:  Procedure Laterality Date   APPENDECTOMY     BUNIONECTOMY WITH HAMMERTOE RECONSTRUCTION Left 07/06/13   [place]   CARDIAC CATHETERIZATION  2006   normal study   CARPAL TUNNEL RELEASE  2011   right hand   HYSTEROPLASTY REPAIR OF UTERINE ANOMALY     2003 - laproscopically; 2006 - laparotomy   LAPAROSCOPIC ENDOMETRIOSIS FULGURATION     19" 96 and 2002   MANDIBLE RECONSTRUCTION     NASAL SEPTUM SURGERY  11/2012   [facility] FINGER RELEASE  2011   R thimb and ring finger   TRIGGER FINGER RELEASE  02/04/2012   [place], in office    Family History  Problem Relation Age of Onset   Allergic rhinitis Mother    Arthritis Mother        DOB 90   Cancer Mother        Thyroid cancer   Hypertension Mother    Atrial fibrillation Mother    Melanoma Mother    Breast cancer Mother    Arthritis Father        DOB 1929   Melanoma Father    Allergic rhinitis Sister    Colon polyps Neg Hx    Rectal cancer Neg Hx  Stomach cancer Neg Hx     SOCIAL HX: Non-smoker   Current Outpatient Medications:    sertraline (ZOLOFT) 100 MG tablet, , Disp: , Rfl:    albuterol (VENTOLIN HFA) 108 (90 Base) MCG/ACT inhaler, Inhale 2 puffs into the lungs every 4 (four) hours as needed for wheezing or shortness of breath., Disp: 18 g, Rfl: 1   aspirin EC 81 MG tablet, Take 1 tablet (81 mg total) by mouth daily. Swallow whole., Disp: 90 tablet, Rfl: 3   Cholecalciferol (VITAMIN D3) 50 MCG (2000 UT) capsule, Take 2,000 Units by mouth daily., Disp: , Rfl:    DIGESTIVE ENZYMES PO, Take 1 tablet by mouth daily as needed., Disp: , Rfl:    fexofenadine (ALLEGRA) 180 MG tablet, Take 180 mg by mouth daily as needed for allergies or rhinitis., Disp: , Rfl:    fluticasone (FLONASE) 50 MCG/ACT nasal spray, SPRAY 2 SPRAYS INTO EACH NOSTRIL EVERY DAY, Disp: 48 mL, Rfl: 3   magnesium oxide (MAG-OX) 400 (240 Mg) MG tablet, Take 400 mg by mouth  daily., Disp: , Rfl:    metroNIDAZOLE (METROCREAM) 0.75 % cream, Apply 1 application topically 2 (two) times daily., Disp: , Rfl:    Omega-3 Fatty Acids (FISH OIL TRIPLE STRENGTH) 1400 MG CAPS, Take by mouth daily as needed., Disp: , Rfl:    omeprazole (PRILOSEC) 20 MG capsule, Take 20 mg by mouth daily as needed., Disp: , Rfl:    Plant Sterols and Stanols (CHOLESTOFF PLUS PO), Take by mouth. (Patient not taking: Reported on 12/04/2022), Disp: , Rfl:    Probiotic Product (PROBIOTIC DAILY PO), Take 1 tablet by mouth daily., Disp: , Rfl:    rosuvastatin (CRESTOR) 40 MG tablet, TAKE 1 TABLET BY MOUTH EVERY DAY, Disp: 90 tablet, Rfl: 2   simethicone (MYLICON) 80 MG chewable tablet, Chew 80 mg by mouth every 6 (six) hours as needed for flatulence., Disp: , Rfl:    SYMBICORT 80-4.5 MCG/ACT inhaler, Inhale 2 puffs into the lungs 2 (two) times daily as needed., Disp: 10.2 g, Rfl: 2   vitamin E 180 MG (400 UNITS) capsule, Take 400 Units by mouth daily., Disp: , Rfl:   EXAM:  VITALS per patient if applicable:  GENERAL: alert, oriented, appears well and in no acute distress  HEENT: atraumatic, conjunttiva clear, no obvious abnormalities on inspection of external nose and ears  NECK: normal movements of the head and neck  LUNGS: on inspection no signs of respiratory distress, breathing rate appears normal, no obvious gross SOB, gasping or wheezing  CV: no obvious cyanosis  MS: moves all visible extremities without noticeable abnormality  PSYCH/NEURO: pleasant and cooperative, no obvious depression or anxiety, speech and thought processing grossly intact  ASSESSMENT AND PLAN:  Discussed the following assessment and plan:  Cough, unspecified type - Plan: DG Chest 2 View patient had recent COVID infection with onset about 10 days ago and positive home COVID test last Thursday.  Subsequent home COVID test earlier today negative.  After initially feeling poorly she seemed to turn a corner and felt  better Monday but then yesterday and this morning had some increased dyspnea with activity and increased fatigue but no recurrent fever.  No chest pain or pleuritic pain.  -We discussed getting chest x-ray with order placed for [facility] facility which is most convenient for her -Monitor temperature closely and follow-up immediately for any fever or worsening symptoms -She knows to go promptly to ER for any dyspnea or certainly if she develops any  new symptoms such as chest pain -may need in office evaluation if CXR unrevealing and continues to feel poorly.      I discussed the assessment and treatment plan with the patient. The patient was provided an opportunity to ask questions and all were answered. The patient agreed with the plan and demonstrated an understanding of the instructions.   The patient was advised to call back or seek an in-person evaluation if the symptoms worsen or if the condition fails to improve as anticipated.     Evelena Peat, MD

## 2023-01-30 ENCOUNTER — Emergency Department (HOSPITAL_COMMUNITY)
Admission: EM | Admit: 2023-01-30 | Discharge: 2023-01-30 | Disposition: A | Discharge status: Home / Self Care | Payer: Federal, State, Local not specified - PPO | Attending: Emergency Medicine | Admitting: Emergency Medicine

## 2023-01-30 ENCOUNTER — Emergency Department (HOSPITAL_COMMUNITY): Payer: Federal, State, Local not specified - PPO

## 2023-01-30 ENCOUNTER — Other Ambulatory Visit: Payer: Self-pay

## 2023-01-30 ENCOUNTER — Encounter (HOSPITAL_COMMUNITY): Payer: Self-pay

## 2023-01-30 DIAGNOSIS — U071 COVID-19: Secondary | ICD-10-CM | POA: Diagnosis not present

## 2023-01-30 DIAGNOSIS — E876 Hypokalemia: Secondary | ICD-10-CM | POA: Diagnosis not present

## 2023-01-30 DIAGNOSIS — Z7982 Long term (current) use of aspirin: Secondary | ICD-10-CM | POA: Insufficient documentation

## 2023-01-30 DIAGNOSIS — R059 Cough, unspecified: Secondary | ICD-10-CM | POA: Diagnosis not present

## 2023-01-30 DIAGNOSIS — R5383 Other fatigue: Secondary | ICD-10-CM | POA: Diagnosis not present

## 2023-01-30 DIAGNOSIS — R42 Dizziness and giddiness: Secondary | ICD-10-CM | POA: Diagnosis not present

## 2023-01-30 DIAGNOSIS — R531 Weakness: Secondary | ICD-10-CM | POA: Diagnosis not present

## 2023-01-30 LAB — CBC
HCT: 44.2 % (ref 36.0–46.0)
Hemoglobin: 14.8 g/dL (ref 12.0–15.0)
MCH: 30.2 pg (ref 26.0–34.0)
MCHC: 33.5 g/dL (ref 30.0–36.0)
MCV: 90.2 fL (ref 80.0–100.0)
Platelets: 257 10*3/uL (ref 150–400)
RBC: 4.9 MIL/uL (ref 3.87–5.11)
RDW: 12 % (ref 11.5–15.5)
WBC: 6.6 10*3/uL (ref 4.0–10.5)
nRBC: 0 % (ref 0.0–0.2)

## 2023-01-30 LAB — BASIC METABOLIC PANEL
Anion gap: 12 (ref 5–15)
BUN: 18 mg/dL (ref 8–23)
CO2: 21 mmol/L — ABNORMAL LOW (ref 22–32)
Calcium: 9.6 mg/dL (ref 8.9–10.3)
Chloride: 104 mmol/L (ref 98–111)
Creatinine, Ser: 0.88 mg/dL (ref 0.44–1.00)
GFR, Estimated: >60 mL/min (ref >60)
Glucose, Bld: 106 mg/dL — ABNORMAL HIGH (ref 70–99)
Potassium: 2.9 mmol/L — ABNORMAL LOW (ref 3.5–5.1)
Sodium: 137 mmol/L (ref 135–145)

## 2023-01-30 LAB — URINALYSIS, ROUTINE W REFLEX MICROSCOPIC
Bilirubin Urine: NEGATIVE
Glucose, UA: NEGATIVE mg/dL
Hgb urine dipstick: NEGATIVE
Ketones, ur: 5 mg/dL — AB
Leukocytes,Ua: NEGATIVE
Nitrite: NEGATIVE
Protein, ur: NEGATIVE mg/dL
Specific Gravity, Urine: 1.01 (ref 1.005–1.030)
pH: 8 (ref 5.0–8.0)

## 2023-01-30 LAB — D-DIMER, QUANTITATIVE: D-Dimer, Quant: <0.27 ug/mL-FEU (ref 0.00–0.50)

## 2023-01-30 LAB — RESP PANEL BY RT-PCR (RSV, FLU A&B, COVID)  RVPGX2
Influenza A by PCR: NEGATIVE
Influenza B by PCR: NEGATIVE
Resp Syncytial Virus by PCR: NEGATIVE
SARS Coronavirus 2 by RT PCR: POSITIVE — AB

## 2023-01-30 LAB — MAGNESIUM: Magnesium: 2.3 mg/dL (ref 1.7–2.4)

## 2023-01-30 MED ORDER — POTASSIUM CHLORIDE CRYS ER 20 MEQ PO TBCR
40.0000 meq | EXTENDED_RELEASE_TABLET | Freq: Once | ORAL | Status: AC
  Administered 2023-01-30: 40 meq via ORAL
  Filled 2023-01-30: qty 2

## 2023-01-30 MED ORDER — SODIUM CHLORIDE 0.9 % IV BOLUS
1000.0000 mL | Freq: Once | INTRAVENOUS | Status: AC
  Administered 2023-01-30: 1000 mL via INTRAVENOUS

## 2023-01-30 MED ORDER — POTASSIUM CHLORIDE CRYS ER 20 MEQ PO TBCR: 20.0000 meq | EXTENDED_RELEASE_TABLET | Freq: Every day | ORAL | 0 refills | Status: DC

## 2023-01-30 MED ORDER — POTASSIUM CHLORIDE 10 MEQ/100ML IV SOLN
10.0000 meq | Freq: Once | INTRAVENOUS | Status: AC
  Administered 2023-01-30: 10 meq via INTRAVENOUS
  Filled 2023-01-30: qty 100

## 2023-01-30 NOTE — ED Provider Notes (Signed)
[place] EMERGENCY DEPARTMENT AT [facility] Provider Note   CSN: [id number] Arrival date & time: 01/30/23  1529     History  Chief Complaint  Patient presents with   Fatigue   Dizziness    Renee [name] is a 62 y.o. female.  Patient is a 62 year old female who presents with weakness.  She was diagnosed with COVID last week.  She said over the weekend she was feeling better.  2 days ago she woke up and felt worse.  She had an episode where she got dizzy and lightheaded and felt she might pass out.  She was generally weaker.  She said her temperature has been low in the morning but better as the day goes on.  This has happened 3 days in a row.  She will have times where she feels like she is having to breathe rapidly in the morning mostly but then by the afternoon she tends to feel better and does not have any ongoing shortness of breath.  She says she has a little bit of a cough that is mostly dry.  No recent fevers.  No vomiting or diarrhea.  No urinary symptoms.       Home Medications Prior to Admission medications   Medication Sig Start Date End Date Taking? Authorizing Provider  potassium chloride SA (KLOR-CON M) 20 MEQ tablet Take 1 tablet (20 mEq total) by mouth daily. 01/30/23  Yes Rolan Bucco, MD  albuterol (VENTOLIN HFA) 108 (90 Base) MCG/ACT inhaler Inhale 2 puffs into the lungs every 4 (four) hours as needed for wheezing or shortness of breath. 11/07/22   Alfonse Spruce, MD  aspirin EC 81 MG tablet Take 1 tablet (81 mg total) by mouth daily. Swallow whole. 09/03/21   Alver Sorrow, NP  Cholecalciferol (VITAMIN D3) 50 MCG (2000 UT) capsule Take 2,000 Units by mouth daily.    [provider]  DIGESTIVE ENZYMES PO Take 1 tablet by mouth daily as needed.    [provider]  fexofenadine (ALLEGRA) 180 MG tablet Take 180 mg by mouth daily as needed for allergies or rhinitis.    [provider]  fluticasone (FLONASE) 50 MCG/ACT nasal  spray SPRAY 2 SPRAYS INTO EACH NOSTRIL EVERY DAY 04/10/21   Myrlene Broker, MD  magnesium oxide (MAG-OX) 400 (240 Mg) MG tablet Take 400 mg by mouth daily.    [provider]  metroNIDAZOLE (METROCREAM) 0.75 % cream Apply 1 application topically 2 (two) times daily. 08/01/20   [provider]  Omega-3 Fatty Acids (FISH OIL TRIPLE STRENGTH) 1400 MG CAPS Take by mouth daily as needed.    [provider]  omeprazole (PRILOSEC) 20 MG capsule Take 20 mg by mouth daily as needed.    [provider]  Plant Sterols and Stanols (CHOLESTOFF PLUS PO) Take by mouth. Patient not taking: Reported on 12/04/2022    [provider]  Probiotic Product (PROBIOTIC DAILY PO) Take 1 tablet by mouth daily.    [provider]  rosuvastatin (CRESTOR) 40 MG tablet TAKE 1 TABLET BY MOUTH EVERY DAY 09/12/22   Lewayne Bunting, MD  sertraline (ZOLOFT) 100 MG tablet  12/11/22   [provider]  simethicone (MYLICON) 80 MG chewable tablet Chew 80 mg by mouth every 6 (six) hours as needed for flatulence.    [provider]  SYMBICORT 80-4.5 MCG/ACT inhaler Inhale 2 puffs into the lungs 2 (two) times daily as needed. 11/07/22   Alfonse Spruce,  MD  vitamin E 180 MG (400 UNITS) capsule Take 400 Units by mouth daily.    [provider]      Allergies    Codeine and Pollen extract    Review of Systems   Review of Systems  Constitutional:  Positive for fatigue. Negative for chills, diaphoresis and fever.  HENT:  Negative for congestion, rhinorrhea and sneezing.   Eyes: Negative.   Respiratory:  Positive for cough and shortness of breath. Negative for chest tightness.   Cardiovascular:  Negative for chest pain and leg swelling.  Gastrointestinal:  Negative for abdominal pain, blood in stool, diarrhea, nausea and vomiting.  Genitourinary:  Negative for difficulty urinating, flank pain, frequency and hematuria.  Musculoskeletal:  Negative for  arthralgias and back pain.  Skin:  Negative for rash.  Neurological:  Positive for light-headedness. Negative for speech difficulty, weakness, numbness and headaches.    Physical Exam Updated Vital Signs BP 125/75   Pulse 66   Temp 98.6 F (37 C) (Oral)   Resp 15   Ht 5\' 7"  (1.702 m)   Wt 62.6 kg   SpO2 98%   BMI 21.61 kg/m  Physical Exam Constitutional:      Appearance: She is well-developed.  HENT:     Head: Normocephalic and atraumatic.  Eyes:     Pupils: Pupils are equal, round, and reactive to light.  Cardiovascular:     Rate and Rhythm: Normal rate and regular rhythm.     Heart sounds: Normal heart sounds.  Pulmonary:     Effort: Pulmonary effort is normal. No respiratory distress.     Breath sounds: Normal breath sounds. No wheezing or rales.  Chest:     Chest wall: No tenderness.  Abdominal:     General: Bowel sounds are normal.     Palpations: Abdomen is soft.     Tenderness: There is no abdominal tenderness. There is no guarding or rebound.  Musculoskeletal:        General: Normal range of motion.     Cervical back: Normal range of motion and neck supple.  Lymphadenopathy:     Cervical: No cervical adenopathy.  Skin:    General: Skin is warm and dry.     Findings: No rash.  Neurological:     General: No focal deficit present.     Mental Status: She is alert and oriented to person, place, and time.     ED Results / Procedures / Treatments   Labs (all labs ordered are listed, but only abnormal results are displayed) Labs Reviewed  RESP PANEL BY RT-PCR (RSV, FLU A&B, COVID)  RVPGX2 - Abnormal; Notable for the following components:      Result Value   SARS Coronavirus 2 by RT PCR POSITIVE (*)    All other components within normal limits  BASIC METABOLIC PANEL - Abnormal; Notable for the following components:   Potassium 2.9 (*)    CO2 21 (*)    Glucose, Bld 106 (*)    All other components within normal limits  URINALYSIS, ROUTINE W REFLEX  MICROSCOPIC - Abnormal; Notable for the following components:   Ketones, ur 5 (*)    All other components within normal limits  CBC  MAGNESIUM  D-DIMER, QUANTITATIVE    EKG EKG Interpretation Date/Time:  Thursday January 30 2023 20:31:12 EDT Ventricular Rate:  81 PR Interval:  141 QRS Duration:  142 QT Interval:  424 QTC Calculation: 493 R Axis:   111  Text Interpretation: Sinus  rhythm RBBB and LPFB since last tracing no significant change Confirmed by Rolan Bucco [id number] on 01/30/2023 9:42:14 PM  Radiology DG Chest 2 View  Result Date: 01/30/2023 CLINICAL DATA:  Cough EXAM: CHEST - 2 VIEW COMPARISON:  08/12/2017, CT 08/08/2012 FINDINGS: Apical scarring. Hyperinflated lungs. No focal airspace disease or pleural effusion. Stable cardiomediastinal silhouette. No pneumothorax IMPRESSION: No active cardiopulmonary disease. Electronically Signed   By: Jasmine Pang M.D.   On: 01/30/2023 19:36    Procedures Procedures    Medications Ordered in ED Medications  potassium chloride SA (KLOR-CON M) CR tablet 40 mEq (40 mEq Oral Given 01/30/23 2117)  potassium chloride 10 mEq in 100 mL IVPB (10 mEq Intravenous New Bag/Given 01/30/23 2125)  sodium chloride 0.9 % bolus 1,000 mL (1,000 mLs Intravenous New Bag/Given 01/30/23 2123)    ED Course/ Medical Decision Making/ A&P                                 Medical Decision Making Amount and/or Complexity of Data Reviewed Labs: ordered. Radiology: ordered.  Risk Prescription drug management.   Patient is a 62 year old who presents with weakness and some near syncopal type episodes after recent COVID-19 infection.  She denies any specific shortness of breath although she had times where she felt like she was breathing hard and fast.  She denies any chest pain.  She is afebrile.  Her labs show hypokalemia.  She was given IV fluids and potassium replacement both oral and IV.  Her magnesium level is normal.  Chest x-ray two-view shows no acute  abnormality.  No evidence of pneumonia.  This was interpreted by me and confirmed by the radiologist.  D-dimer is normal.  No other clinical suggestions of PE.  EKG does not show any arrhythmias.  She is feeling better after IV fluids.  She was discharged home in good condition.  She was given a 3-day prescription for potassium.  She was encouraged to have close follow-up with her PCP.  Return precautions were given.  Final Clinical Impression(s) / ED Diagnoses Final diagnoses:  COVID-19 virus infection  Hypokalemia    Rx / DC Orders ED Discharge Orders          Ordered    potassium chloride SA (KLOR-CON M) 20 MEQ tablet  Daily        01/30/23 2236              Rolan Bucco, MD 01/30/23 2238

## 2023-01-30 NOTE — ED Triage Notes (Signed)
Per EMS, pt had covid last week felt like she was improving Monday. Since then she has been feeling worse. Pt had a near syncopal episode earlier today, and generalized weakness. Pt did a tele visit with PCP and was recommended to go to the ER.

## 2023-01-30 NOTE — Discharge Instructions (Signed)
Follow-up early next week with your primary care doctor for recheck.  You need to have your potassium rechecked.  Return to the emergency room if you have any worsening symptoms.

## 2023-02-02 ENCOUNTER — Encounter: Payer: Self-pay | Admitting: Internal Medicine

## 2023-02-02 ENCOUNTER — Other Ambulatory Visit: Payer: Self-pay | Admitting: Internal Medicine

## 2023-02-02 DIAGNOSIS — R42 Dizziness and giddiness: Secondary | ICD-10-CM

## 2023-02-02 DIAGNOSIS — E876 Hypokalemia: Secondary | ICD-10-CM

## 2023-02-03 ENCOUNTER — Other Ambulatory Visit (INDEPENDENT_AMBULATORY_CARE_PROVIDER_SITE_OTHER): Payer: Federal, State, Local not specified - PPO

## 2023-02-03 DIAGNOSIS — E876 Hypokalemia: Secondary | ICD-10-CM

## 2023-02-03 DIAGNOSIS — R42 Dizziness and giddiness: Secondary | ICD-10-CM

## 2023-02-03 LAB — VITAMIN D 25 HYDROXY (VIT D DEFICIENCY, FRACTURES): VITD: 50.77 ng/mL (ref 30.00–100.00)

## 2023-02-03 LAB — COMPREHENSIVE METABOLIC PANEL
ALT: 18 U/L (ref 0–35)
AST: 22 U/L (ref 0–37)
Albumin: 4.6 g/dL (ref 3.5–5.2)
Alkaline Phosphatase: 60 U/L (ref 39–117)
BUN: 14 mg/dL (ref 6–23)
CO2: 29 mEq/L (ref 19–32)
Calcium: 9.8 mg/dL (ref 8.4–10.5)
Chloride: 99 mEq/L (ref 96–112)
Creatinine, Ser: 0.89 mg/dL (ref 0.40–1.20)
GFR: 69.87 mL/min (ref >60.00)
Glucose, Bld: 98 mg/dL (ref 70–99)
Potassium: 4 mEq/L (ref 3.5–5.1)
Sodium: 137 mEq/L (ref 135–145)
Total Bilirubin: 0.8 mg/dL (ref 0.2–1.2)
Total Protein: 7.6 g/dL (ref 6.0–8.3)

## 2023-02-03 LAB — VITAMIN B12: Vitamin B-12: 1222 pg/mL — ABNORMAL HIGH (ref 211–911)

## 2023-02-03 LAB — TSH: TSH: 1.92 u[IU]/mL (ref 0.35–5.50)

## 2023-02-03 NOTE — Telephone Encounter (Signed)
Pt was labs done.Marland KitchenRaechel [name]

## 2023-02-13 ENCOUNTER — Ambulatory Visit: Payer: Federal, State, Local not specified - PPO | Admitting: Allergy & Immunology

## 2023-02-13 ENCOUNTER — Other Ambulatory Visit: Payer: Self-pay

## 2023-02-13 VITALS — BP 110/78 | HR 74 | Temp 98.3°F | Resp 18 | Ht 67.72 in | Wt 138.3 lb

## 2023-02-13 DIAGNOSIS — K219 Gastro-esophageal reflux disease without esophagitis: Secondary | ICD-10-CM

## 2023-02-13 DIAGNOSIS — Z23 Encounter for immunization: Secondary | ICD-10-CM

## 2023-02-13 DIAGNOSIS — J301 Allergic rhinitis due to pollen: Secondary | ICD-10-CM | POA: Diagnosis not present

## 2023-02-13 DIAGNOSIS — J4540 Moderate persistent asthma, uncomplicated: Secondary | ICD-10-CM

## 2023-02-13 DIAGNOSIS — B999 Unspecified infectious disease: Secondary | ICD-10-CM | POA: Diagnosis not present

## 2023-02-13 NOTE — Progress Notes (Signed)
Immunotherapy   Patient Details  Name: Renee [name] MRN: [id number] Date of Birth: 1960-11-09  02/13/2023  [name]  Patient received the Pneumovax vaccine in the Left Deltoid and waited 15 minutes in office without any issues.  Lot #[id number] EXP 02/12/2024   [name] 02/13/2023, 3:49 PM

## 2023-02-13 NOTE — Progress Notes (Signed)
FOLLOW UP  Date of Service/Encounter:  02/13/23   Assessment:   Moderate persistent asthma - well controled with MART regimen of Symbicort   Seasonal allergic rhinitis due to pollen (grasses) - with clear perennial symptoms   Recurrent infections - with inadequate protection against Streptococcus pneumonia (Pneumovax given today)   Gastroesophageal reflux disease - controlled with dietary changes only   History of multiple traumas, including struggles with IVF as well as dealing with alcoholism in her husband  Plan/Recommendations:    1. SOB (shortness of breath) - Breathing test not done today due the recent COVID.  - I think it is fine to go back to the Symbicort as needed like you are doing.  - It seems that you have a good handle on your symptoms.  - Daily controller medication(s): Symbicort 80/4.16mcg two puffs twice daily with spacer AS NEEDED like you are doing - Prior to physical activity: albuterol 2 puffs 10-15 minutes before physical activity. - Rescue medications: albuterol 4 puffs every 4-6 hours as needed - Asthma control goals:  * Full participation in all desired activities (may need albuterol before activity) * Albuterol use two time or less a week on average (not counting use with activity) * Cough interfering with sleep two time or less a month * Oral steroids no more than once a year * No hospitalizations  2. 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  - I am going to send my note to Dr. Marene Lenz to keep her in the loop. - Continue with: Allegra (fexofenadine) 180mg  tablet once daily (alternate every 3 months)  3. Gastroesophageal reflux disease - Continue with dietary changes.   4. Return in about 6 months (around 08/16/2023).    Subjective:   Renee [name] is a 62 y.o. female presenting today for follow up of  Chief Complaint  Patient  presents with   Asthma    Says she is about the same as before. Had covid two weeks ago.    Allergic Rhinitis     Says she is the same as before. Says ENT diagnosed with staph infections, she still currently has.     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  Renee [name] is a 62 y.o. female presenting for a follow up visit.  She was last seen in May 2024.  At that time, we did not do a breathing test.  We recommended going back to Symbicort 80 mcg 2 puffs twice daily.  We got some labs to see if we need to start a biologic.  For allergic rhinitis, we sent in 2 weeks of Bactrim.  We continued with Allegra 180 mg once  daily and we have done an immune workup.  For her GERD, we continue with the dietary changes.  Her labs to look for serious causes of asthma were completely negative.  She never did get a complete blood count.  Her immune workup was largely normal, but she was only protective to 4 out of 23 strains of Streptococcus pneumonia.  We did ask her to get a Pneumovax or Prevnar 20, but she never responded to the [employer].  Since last visit, she has done well. She did have COVID two weeks ago. This was worse than the first time that she had it. She did not get  Paxlovid. Her father had PNA and COVID and was in the hospital. They were juggling her parents. She had some shallow breathing when she had COVID. She was given fluids in the ED and she was noted to have a low potassium. This was August 1st. She also had a negative D-dimer and a normal EKG. She had 50 mEq of potassium before being discharged.   She is using her Symbicort only as needed. When she does feel that she needs it, it certainly seems to help. She has not been on prednisone. She feels that her breathing is under fairly good control at this point in time.   She still has the Staph infection again. She is having green drainage again in her nose from the presumed Staph infection. She has bene using the ointment which does help. She does not have a follow up appointment with Dr. Marene Lenz at this point in time. The mupirocin seems to help when she uses it, but she hates being dependent on it. She has not had any antibiotics since we saw her last time aside from the mupirocin.  She is using omeprazole as needed for her GERD. In general, she does not like taking anything on a routine basis. She does not feel that GERD is contributing to her reflux symptoms at all.   Otherwise, there have been no changes to her past medical history, surgical history, family history, or social history.    Review of systems otherwise negative other than that mentioned in the HPI.    Objective:   Blood pressure 110/78, pulse 74, temperature 98.3 F (36.8 C), temperature source Temporal, resp. rate 18, height 5' 7.72" (1.72 m), weight 138 lb 4.8 oz (62.7 kg), SpO2 97%. Body mass index is 21.2 kg/m.    Physical Exam Vitals reviewed.  Constitutional:      Appearance: She is well-developed.     Comments: Very pleasant and talkative. Smiling.   HENT:     Head: Normocephalic and atraumatic.     Right Ear: Tympanic membrane, ear canal and external ear normal. No drainage, swelling or tenderness. Tympanic  membrane is not injected, scarred, erythematous, retracted or bulging.     Left Ear: Tympanic membrane, ear canal and external ear normal. No drainage, swelling or tenderness. Tympanic membrane is not injected, scarred, erythematous, retracted or bulging.     Nose: No nasal deformity, septal deviation, mucosal edema or rhinorrhea.     Right Turbinates: Enlarged, swollen and pale.     Left Turbinates: Enlarged, swollen and pale.     Right Sinus: No maxillary sinus tenderness or frontal sinus tenderness.     Left Sinus: No maxillary sinus tenderness or frontal sinus tenderness.     Comments: Congestion is much worse on the left versus the right.  No epistaxis.    Mouth/Throat:  Mouth: Mucous membranes are not pale and not dry.     Pharynx: Uvula midline.  Eyes:     General:        Right eye: No discharge.        Left eye: No discharge.     Conjunctiva/sclera: Conjunctivae normal.     Right eye: Right conjunctiva is not injected. No chemosis.    Left eye: Left conjunctiva is not injected. No chemosis.    Pupils: Pupils are equal, round, and reactive to light.  Cardiovascular:     Rate and Rhythm: Normal rate and regular rhythm.     Heart sounds: Normal heart sounds.  Pulmonary:     Effort: Pulmonary effort is normal. No tachypnea, accessory muscle usage or respiratory distress.     Breath sounds: Normal breath sounds. No wheezing, rhonchi or rales.     Comments: Moving air well in all lung fields.  No increased work of breathing.  Crackles. Chest:     Chest wall: No tenderness.  Abdominal:     Tenderness: There is no abdominal tenderness. There is no guarding or rebound.  Lymphadenopathy:     Head:     Right side of head: No submandibular, tonsillar or occipital adenopathy.     Left side of head: No submandibular, tonsillar or occipital adenopathy.     Cervical: No cervical adenopathy.  Skin:    Coloration: Skin is not pale.     Findings: No abrasion, erythema, petechiae or rash.  Rash is not papular, urticarial or vesicular.  Neurological:     Mental Status: She is alert.  Psychiatric:        Behavior: Behavior is cooperative.      Diagnostic studies: none  Pneumovax given in clinic with good results.       Malachi Bonds, MD  [facility]

## 2023-02-13 NOTE — Patient Instructions (Addendum)
1. SOB (shortness of breath) - Breathing test not done today due the recent COVID.  - I think it is fine to go back to the Symbicort as needed like you are doing.  - It seems that you have a good handle on your symptoms.  - Daily controller medication(s): Symbicort 80/4.66mcg two puffs twice daily with spacer AS NEEDED like you are doing - Prior to physical activity: albuterol 2 puffs 10-15 minutes before physical activity. - Rescue medications: albuterol 4 puffs every 4-6 hours as needed - Asthma control goals:  * Full participation in all desired activities (may need albuterol before activity) * Albuterol use two time or less a week on average (not counting use with activity) * Cough interfering with sleep two time or less a month * Oral steroids no more than once a year * No hospitalizations  2. 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  - I am going to send my note to Dr. Marene Lenz to keep her in the loop. - Continue with: Allegra (fexofenadine) 180mg  tablet once daily (alternate every 3 months)  3. Gastroesophageal reflux disease - Continue with dietary changes.   4. Return in about 6 months (around 08/16/2023).    Please inform us of any Emergency Department visits, hospitalizations, or changes in symptoms. Call us before going to the ED for breathing or allergy symptoms since we might be able to fit you in for a sick visit. Feel free to contact us anytime with any questions, problems, or concerns.  It was a pleasure to see you today!  Websites that have reliable patient information: 1. American Academy of Asthma, Allergy, and Immunology: www.aaaai.org 2. Food Allergy Research and Education (FARE): foodallergy.org 3. Mothers of Asthmatics: http://www.asthmacommunitynetwork.org 4. American College of Allergy, Asthma, and Immunology: www.acaai.org   COVID-19 Vaccine  Information can be found at: [email or web address].[email or web address] For questions related to vaccine distribution or appointments, please email [email or web address] or call [phone].   We realize that you might be concerned about having an allergic reaction to the COVID19 vaccines. To help with that concern, WE ARE OFFERING THE COVID19 VACCINES IN OUR OFFICE! Ask the front desk for dates!     "Like" [place] on Facebook and Instagram for our latest updates!      A healthy democracy works best when [employer] participate! Make sure you are registered to vote! If you have moved or changed any of your contact information, you will need to get this updated before voting!  In some cases, you MAY be able to register to vote online: [email or web address]

## 2023-02-16 ENCOUNTER — Encounter: Payer: Self-pay | Admitting: Allergy & Immunology

## 2023-02-19 ENCOUNTER — Ambulatory Visit: Payer: Federal, State, Local not specified - PPO | Admitting: Internal Medicine

## 2023-02-19 ENCOUNTER — Encounter: Payer: Self-pay | Admitting: Internal Medicine

## 2023-02-19 VITALS — BP 112/80 | HR 69 | Temp 98.4°F | Ht 67.5 in | Wt 138.0 lb

## 2023-02-19 DIAGNOSIS — R5383 Other fatigue: Secondary | ICD-10-CM | POA: Insufficient documentation

## 2023-02-19 DIAGNOSIS — G9339 Other post infection and related fatigue syndromes: Secondary | ICD-10-CM

## 2023-02-19 DIAGNOSIS — Z01419 Encounter for gynecological examination (general) (routine) without abnormal findings: Secondary | ICD-10-CM | POA: Diagnosis not present

## 2023-02-19 DIAGNOSIS — Z1231 Encounter for screening mammogram for malignant neoplasm of breast: Secondary | ICD-10-CM | POA: Diagnosis not present

## 2023-02-19 DIAGNOSIS — Z13820 Encounter for screening for osteoporosis: Secondary | ICD-10-CM | POA: Diagnosis not present

## 2023-02-19 DIAGNOSIS — Z6820 Body mass index (BMI) 20.0-20.9, adult: Secondary | ICD-10-CM | POA: Diagnosis not present

## 2023-02-19 NOTE — Assessment & Plan Note (Signed)
Suspect some muscle loss during covid-19. She is encouraged to continue resuming exercise and this may take some weeks to get back to normal. Potassium normal on recheck as well as thyroid and B12 and vitamin D.

## 2023-02-19 NOTE — Progress Notes (Signed)
   Subjective:   Patient ID: Renee [name], female    DOB: 05/24/1961, 62 y.o.   MRN: [id number]  HPI The patient is a 62 YO female coming in for ER follow up recent covid-19 and low potassium. Recheck of labs after supplementation were normal. She is still fatigued and has resumed some exercise.  PMH, [facility], social history reviewed and updated  Review of Systems  Constitutional:  Positive for fatigue.  HENT: Negative.    Eyes: Negative.   Respiratory:  Negative for cough, chest tightness and shortness of breath.   Cardiovascular:  Negative for chest pain, palpitations and leg swelling.  Gastrointestinal:  Negative for abdominal distention, abdominal pain, constipation, diarrhea, nausea and vomiting.  Musculoskeletal: Negative.   Skin: Negative.   Neurological: Negative.   Psychiatric/Behavioral: Negative.      Objective:  Physical Exam Constitutional:      Appearance: She is well-developed.  HENT:     Head: Normocephalic and atraumatic.  Cardiovascular:     Rate and Rhythm: Normal rate and regular rhythm.  Pulmonary:     Effort: Pulmonary effort is normal. No respiratory distress.     Breath sounds: Normal breath sounds. No wheezing or rales.  Abdominal:     General: Bowel sounds are normal. There is no distension.     Palpations: Abdomen is soft.     Tenderness: There is no abdominal tenderness. There is no rebound.  Musculoskeletal:     Cervical back: Normal range of motion.  Skin:    General: Skin is warm and dry.  Neurological:     Mental Status: She is alert and oriented to person, place, and time.     Coordination: Coordination normal.     Vitals:   02/19/23 1050  BP: 112/80  Pulse: 69  Temp: 98.4 F (36.9 C)  TempSrc: Oral  SpO2: 97%  Weight: 138 lb (62.6 kg)  Height: 5' 7.5" (1.715 m)    Assessment & Plan:  Visit time 15 minutes in face to face communication with patient and coordination of care, additional 5 minutes spent in record review,  coordination or care, ordering tests, communicating/referring to other healthcare professionals, documenting in medical records all on the same day of the visit for total time 20 minutes spent on the visit.

## 2023-02-20 ENCOUNTER — Encounter: Payer: Self-pay | Admitting: Gastroenterology

## 2023-02-20 ENCOUNTER — Ambulatory Visit: Payer: Federal, State, Local not specified - PPO | Admitting: Gastroenterology

## 2023-02-20 VITALS — BP 124/70 | HR 78 | Ht 67.5 in | Wt 138.0 lb

## 2023-02-20 DIAGNOSIS — K219 Gastro-esophageal reflux disease without esophagitis: Secondary | ICD-10-CM | POA: Diagnosis not present

## 2023-02-20 DIAGNOSIS — Z1211 Encounter for screening for malignant neoplasm of colon: Secondary | ICD-10-CM | POA: Diagnosis not present

## 2023-02-20 DIAGNOSIS — R0602 Shortness of breath: Secondary | ICD-10-CM

## 2023-02-20 MED ORDER — NA SULFATE-K SULFATE-MG SULF 17.5-3.13-1.6 GM/177ML PO SOLN: 1.0000 | Freq: Once | ORAL | 0 refills | Status: AC

## 2023-02-20 MED ORDER — FAMOTIDINE 40 MG PO TABS: 40.0000 mg | ORAL_TABLET | Freq: Every day | ORAL | 3 refills | Status: AC

## 2023-02-20 NOTE — Patient Instructions (Signed)
We have sent the following medications to your pharmacy for you to pick up at your convenience: Pepcid 40 mg nightly.   You have been scheduled for an endoscopy and colonoscopy. Please follow the written instructions given to you at your visit today.  Please pick up your prep supplies at the pharmacy within the next 1-3 days.  If you use inhalers (even only as needed), please bring them with you on the day of your procedure.  DO NOT TAKE 7 DAYS PRIOR TO TEST- Trulicity (dulaglutide) Ozempic, Wegovy (semaglutide) Mounjaro (tirzepatide) Bydureon Bcise (exanatide extended release)  DO NOT TAKE 1 DAY PRIOR TO YOUR TEST Rybelsus (semaglutide) Adlyxin (lixisenatide) Victoza (liraglutide) Byetta (exanatide) ___________________________________________________________________________  _______________________________________________________  If your blood pressure at your visit was 140/90 or greater, please contact your primary care physician to follow up on this.  _______________________________________________________  If you are age 47 or older, your body mass index should be between 23-30. Your Body mass index is 21.29 kg/m. If this is out of the aforementioned range listed, please consider follow up with your Primary Care Provider.  If you are age 57 or younger, your body mass index should be between 19-25. Your Body mass index is 21.29 kg/m. If this is out of the aformentioned range listed, please consider follow up with your Primary Care Provider.   ________________________________________________________  The [facility] providers would like to encourage you to use [facility] to communicate with providers for non-urgent requests or questions.  Due to long hold times on the telephone, sending your provider a message by [facility] may be a faster and more efficient way to get a response.  Please allow 48 business hours for a response.  Please remember that this is for non-urgent requests.   _______________________________________________________

## 2023-02-20 NOTE — Progress Notes (Signed)
HPI: FU palpitations and coronary artery disease. Echocardiogram November 2021 showed normal LV function, trace mitral regurgitation. Cardiac CTA March 2023 showed ascending aorta of 3.9 cm and unchanged 3 mm nodule in the left lung base; calcium score 146 which was 92nd percentile; moderate 50 to 69% stenosis in the proximal LAD.  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  CPX April 2024 demonstrated normal functional capacity with no clear indication of cardiopulmonary abnormality or exercise-induced bronchospasm.  Since last seen she occasionally feels a sensation of dyspnea but denies exertional chest pain, recurrent palpitations or syncope.  Current Outpatient Medications  Medication Sig Dispense Refill   albuterol (VENTOLIN HFA) 108 (90 Base) MCG/ACT inhaler Inhale 2 puffs into the lungs every 4 (four) hours as needed for wheezing or shortness of breath. 18 g 1   aspirin EC 81 MG tablet Take 1 tablet (81 mg total) by mouth daily. Swallow whole. 90 tablet 3   Cholecalciferol (VITAMIN D3) 50 MCG (2000 UT) capsule Take 2,000 Units by mouth daily.     cloNIDine (CATAPRES) 0.1 MG tablet Take 0.1 mg by mouth daily.     DIGESTIVE ENZYMES PO Take 1 tablet by mouth daily as needed.     famotidine (PEPCID) 40 MG tablet Take 1 tablet (40 mg total) by mouth daily. 90 tablet 3   fluticasone (FLONASE) 50 MCG/ACT nasal spray SPRAY 2 SPRAYS INTO EACH NOSTRIL EVERY DAY 48 mL 3   levocetirizine (XYZAL ALLERGY 24HR) 5 MG tablet Take 5 mg by mouth every evening.     magnesium oxide (MAG-OX) 400 (240 Mg) MG tablet Take 400 mg by mouth daily.     metroNIDAZOLE (METROCREAM) 0.75 % cream Apply 1 application topically 2 (two) times daily.     Omega-3 Fatty Acids (FISH OIL TRIPLE STRENGTH) 1400 MG CAPS Take by mouth daily as needed.     omeprazole (PRILOSEC) 20 MG capsule Take 20 mg by mouth  daily as needed.     Plant Sterols and Stanols (CHOLESTOFF PLUS PO) Take by mouth.     Probiotic Product (PROBIOTIC DAILY PO) Take 1 tablet by mouth daily.     rosuvastatin (CRESTOR) 40 MG tablet TAKE 1 TABLET BY MOUTH EVERY DAY 90 tablet 2   sertraline (ZOLOFT) 100 MG tablet      SYMBICORT 80-4.5 MCG/ACT inhaler Inhale 2 puffs into the lungs 2 (two) times daily as needed. 10.2 g 2   vitamin E 180 MG (400 UNITS) capsule Take 400 Units by mouth daily.     No current facility-administered medications for this visit.     Past Medical History:  Diagnosis Date   Abnormal chest x-ray    CXR c/w COPD. Full PFTs '11 - normal   Allergy    seasonal   Anxiety    Arthritis    both knees, post traumatic - neck   Asthma    Cataract    removed bilateral   COVID    Depression    with anxiety.   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   Migraines    during teenage years   MVP (mitral valve prolapse)    last 2D echo approx '10   Osteopenia  DEXA 03/2012: -1.0    Past Surgical History:  Procedure Laterality Date   APPENDECTOMY     BUNIONECTOMY WITH HAMMERTOE RECONSTRUCTION Left 07/06/13   [facility]   CARDIAC CATHETERIZATION  2006   normal study   CARPAL TUNNEL RELEASE  2011   right hand   HYSTEROPLASTY REPAIR OF UTERINE ANOMALY     2003 - laproscopically; 2006 - laparotomy   LAPAROSCOPIC ENDOMETRIOSIS FULGURATION     1996 and 2002   MANDIBLE RECONSTRUCTION     NASAL SEPTUM SURGERY  11/2012   [facility] FINGER RELEASE  2011   R thimb and ring finger   TRIGGER FINGER RELEASE  02/04/2012   [place], in office    Social History   Socioeconomic History   Marital status: Married    Spouse name: Not on file   Number of children: 0   Years of education: 16   Highest education level: Bachelor's degree (e.g., BA, AB, BS)   Occupational History   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   Married '94.   Denies any physical or sexual abuse.   Family in [place]: parents and sister.   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   Has smoke alarms, wears seatbelt at all times, uses helmut when appropriate, firearms present in the home, uses herbal remedies, caffeine- 2-3 per day.   No regular exercise.   Social Determinants of Health   Financial Resource Strain: Low Risk  (02/18/2023)   Overall Financial Resource Strain (CARDIA)    Difficulty of Paying Living Expenses: Not hard at all  Food Insecurity: No Food Insecurity (02/18/2023)   Hunger Vital Sign    Worried About Running Out of Food in the Last Year: Never true    Ran Out of Food in the Last Year: Never true  Transportation Needs: No Transportation Needs (02/18/2023)   PRAPARE - Administrator, Civil Service (Medical): No    Lack of Transportation (Non-Medical): No  Physical Activity: Insufficiently Active (02/18/2023)   Exercise Vital Sign    Days of Exercise per Week: 2 days    Minutes of Exercise per Session: 60 min  Stress: No Stress Concern Present (02/18/2023)   [employer] of Occupational Health - Occupational Stress Questionnaire    Feeling of Stress : Only a little  Social Connections: Moderately Integrated (02/18/2023)   Social Connection and Isolation Panel [NHANES]    Frequency of Communication with Friends and Family: More than three times a week    Frequency of Social Gatherings with Friends and Family: Once a  week    Attends Religious Services: More than 4 times per year    Active Member of [employer] or Organizations: No    Attends Engineer, structural: Not on file    Marital Status: Married  Catering manager Violence: Not on file    Family History  Problem Relation Age of Onset   Allergic rhinitis Mother    Arthritis Mother  DOB 1934   Cancer Mother        Thyroid cancer   Hypertension Mother    Atrial fibrillation Mother    Melanoma Mother    Breast cancer Mother    Arthritis Father        DOB 10   Melanoma Father    Allergic rhinitis Sister    Colon polyps Neg Hx    Rectal cancer Neg Hx    Stomach cancer Neg Hx     ROS: no fevers or chills, productive cough, hemoptysis, dysphasia, odynophagia, melena, hematochezia, dysuria, hematuria, rash, seizure activity, orthopnea, PND, pedal edema, claudication. Remaining systems are negative.  Physical Exam: Well-developed well-nourished in no acute distress.  Skin is warm and dry.  HEENT is normal.  Neck is supple.  Chest is clear to auscultation with normal expansion.  Cardiovascular exam is regular rate and rhythm.  Abdominal exam nontender or distended. No masses palpated. Extremities show no edema. neuro grossly intact  A/P  1 coronary artery disease-moderate disease in LAD on previous cardiac CTA.  Plan to continue aspirin and statin.  Patient denies chest pain.  2 pulmonary nodule-plan follow-up CT February 2025.  3 hyperlipidemia-continue statin.  4 question history of mitral valve prolapse-this was not evident on most recent echocardiogram.  5 palpitations-no recent symptoms.  Can consider beta-blockade in the future if necessary.  Olga Millers, MD

## 2023-02-20 NOTE — Progress Notes (Signed)
Agree with assessment and plan as outlined with the following thoughts. Renee [name] given her last EGD did not show a cause for the same symptoms, not sure if we need to do the EGD unless the patient is requesting this? I doubt will show a cause for her shortness of breath which appears chronic and longstanding.

## 2023-02-20 NOTE — Progress Notes (Signed)
02/20/2023 Renee [name] [id number] Oct 09, 1960   HISTORY OF PRESENT ILLNESS:  This is a 62 year old female who was previously a patient of Dr. Christella Hartigan.  Her care will be reassigned to Dr. Adela Lank at her request since her husband also sees Dr. Adela Lank.  Anyway, she is due for colonoscopy as her last was in 2013.  It was normal at that time.  She was then seen by me in 2017 with complaints of reflux and more so shortness of breath.  She had been seen by pulmonary, etc. nobody could figure out why she was having that issue.  EGD in 2017 showed only some gastritis.  She is still having shortness of breath issue and continues to seek out answers from asthma/allergy, pulmonary, etc.  Anyway, she denies any overt heartburn and reflux type symptoms.  She is wondering if it could be silent reflux.  Has not been on any antireflux medications as of late.  She is using nasal sprays, inhalers, etc.  She says that her bowel movements fluctuate and have for a long time.  As of late she wsa having more issues with constipation so has been taking magnesium oxide each night and that has helped with that.  No abdominal pain.  No rectal bleeding.   Past Medical History:  Diagnosis Date   Abnormal chest x-ray    CXR c/w COPD. Full PFTs '11 - normal   Allergy    seasonal   Anxiety    Arthritis    both knees, post traumatic - neck   Asthma    Cataract    removed bilateral   COVID    Depression    with anxiety.   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   Migraines    during teenage years   MVP (mitral valve prolapse)    last 2D echo approx '10   Osteopenia    DEXA 03/2012: -1.0   Past Surgical History:  Procedure Laterality Date   APPENDECTOMY     BUNIONECTOMY WITH HAMMERTOE RECONSTRUCTION Left 07/06/13   [facility]   CARDIAC CATHETERIZATION  2006    normal study   CARPAL TUNNEL RELEASE  2011   right hand   HYSTEROPLASTY REPAIR OF UTERINE ANOMALY     2003 - laproscopically; 2006 - laparotomy   LAPAROSCOPIC ENDOMETRIOSIS FULGURATION     1996 and 2002   MANDIBLE RECONSTRUCTION     NASAL SEPTUM SURGERY  11/2012   [facility] FINGER RELEASE  2011   R thimb and ring finger   TRIGGER FINGER RELEASE  02/04/2012   [place], in office    reports that she has never smoked. She has been exposed to tobacco smoke. She has never used smokeless tobacco. She reports current alcohol use. She reports that she does not use drugs. family history includes Allergic rhinitis in her mother and sister; Arthritis in her father and mother; Atrial fibrillation in her mother; Breast cancer in her mother; Cancer in her mother; Hypertension in her mother; Melanoma in her father and mother. Allergies  Allergen Reactions   Codeine Other (See Comments)    Hot flashes/passed out   Pollen Extract Itching and Cough    drainage      Outpatient Encounter Medications as of 02/20/2023  Medication Sig   albuterol (VENTOLIN HFA) 108 (  90 Base) MCG/ACT inhaler Inhale 2 puffs into the lungs every 4 (four) hours as needed for wheezing or shortness of breath.   aspirin EC 81 MG tablet Take 1 tablet (81 mg total) by mouth daily. Swallow whole.   Cholecalciferol (VITAMIN D3) 50 MCG (2000 UT) capsule Take 2,000 Units by mouth daily.   DIGESTIVE ENZYMES PO Take 1 tablet by mouth daily as needed.   famotidine (PEPCID) 40 MG tablet Take 1 tablet (40 mg total) by mouth daily.   fluticasone (FLONASE) 50 MCG/ACT nasal spray SPRAY 2 SPRAYS INTO EACH NOSTRIL EVERY DAY   magnesium oxide (MAG-OX) 400 (240 Mg) MG tablet Take 400 mg by mouth daily.   metroNIDAZOLE (METROCREAM) 0.75 % cream Apply 1 application topically 2 (two) times daily.   Na Sulfate-K Sulfate-Mg Sulf 17.5-3.13-1.6 GM/177ML SOLN Take 1 kit by mouth once for 1 dose.   Omega-3 Fatty Acids (FISH OIL TRIPLE STRENGTH) 1400  MG CAPS Take by mouth daily as needed.   omeprazole (PRILOSEC) 20 MG capsule Take 20 mg by mouth daily as needed.   Plant Sterols and Stanols (CHOLESTOFF PLUS PO) Take by mouth.   Probiotic Product (PROBIOTIC DAILY PO) Take 1 tablet by mouth daily.   rosuvastatin (CRESTOR) 40 MG tablet TAKE 1 TABLET BY MOUTH EVERY DAY   sertraline (ZOLOFT) 100 MG tablet    SYMBICORT 80-4.5 MCG/ACT inhaler Inhale 2 puffs into the lungs 2 (two) times daily as needed.   vitamin E 180 MG (400 UNITS) capsule Take 400 Units by mouth daily.   No facility-administered encounter medications on file as of 02/20/2023.     REVIEW OF SYSTEMS  : All other systems reviewed and negative except where noted in the History of Present Illness.   PHYSICAL EXAM: BP 124/70   Pulse 78   Ht 5' 7.5" (1.715 m)   Wt 138 lb (62.6 kg)   BMI 21.29 kg/m  General: Well developed white female in no acute distress Head: Normocephalic and atraumatic Eyes:  Sclerae anicteric, conjunctiva pink. Ears: Normal auditory acuity Lungs: Clear throughout to auscultation; no W/R/R. Heart: Regular rate and rhythm; no M/R/G. Abdomen: Soft, non-distended.  BS present.  Non-tender. Rectal:  Will be done at the time of colonoscopy. Musculoskeletal: Symmetrical with no gross deformities  Skin: No lesions on visible extremities Extremities: No edema  Neurological: Alert oriented x 4, grossly non-focal Psychological:  Alert and cooperative. Normal mood and affect  ASSESSMENT AND PLAN: *GERD and shortness of breath: She has been treated intermittently for reflux over many years.  She has had an issue with shortness of breath, unable to take a deep breath for several years, in fact her endoscopy in 2017 was performed for that reason.  Only had gastritis.  They are still trying to figure out why she has this problem.  Will repeat EGD since she is due for colonoscopy as well.  Will plan for EGD with Dr. Adela Lank.  Will start some Pepcid 40 mg at  bedtime since that is an H2 blocker may help if it is anything to do with allergy, etc. as well as reflux. *CRC screening:  Last colonoscopy in 2013.  Will schedule with Dr. Adela Lank at the patient's request.  **The risks, benefits, and alternatives to EGD and colonoscopy were discussed with the patient and she consents to proceed.   CC:  Myrlene Broker, *

## 2023-02-24 ENCOUNTER — Other Ambulatory Visit: Payer: Self-pay | Admitting: Obstetrics and Gynecology

## 2023-02-24 DIAGNOSIS — R928 Other abnormal and inconclusive findings on diagnostic imaging of breast: Secondary | ICD-10-CM

## 2023-02-26 ENCOUNTER — Ambulatory Visit: Payer: Federal, State, Local not specified - PPO | Attending: Cardiology | Admitting: Cardiology

## 2023-02-26 ENCOUNTER — Encounter: Payer: Self-pay | Admitting: Cardiology

## 2023-02-26 VITALS — BP 120/73 | HR 75 | Ht 67.5 in | Wt 139.0 lb

## 2023-02-26 DIAGNOSIS — E785 Hyperlipidemia, unspecified: Secondary | ICD-10-CM

## 2023-02-26 DIAGNOSIS — I77819 Aortic ectasia, unspecified site: Secondary | ICD-10-CM

## 2023-02-26 DIAGNOSIS — I25118 Atherosclerotic heart disease of native coronary artery with other forms of angina pectoris: Secondary | ICD-10-CM | POA: Diagnosis not present

## 2023-02-26 DIAGNOSIS — R911 Solitary pulmonary nodule: Secondary | ICD-10-CM | POA: Diagnosis not present

## 2023-02-26 NOTE — Patient Instructions (Signed)
Medication Instructions:   Your physician recommends that you continue on your current medications as directed. Please refer to the Current Medication list given to you today.   *If you need a refill on your cardiac medications before your next appointment, please call your pharmacy*   Lab Work:  None ordered.  If you have labs (blood work) drawn today and your tests are completely normal, you will receive your results only by: MyChart Message (if you have MyChart) OR A paper copy in the mail If you have any lab test that is abnormal or we need to change your treatment, we will call you to review the results.   Testing/Procedures:  Non-Cardiac CT scanning, (CAT scanning), is a noninvasive, special x-ray that produces cross-sectional images of the body using x-rays and a computer. CT scans help physicians diagnose and treat medical conditions. For some CT exams, a contrast material is used to enhance visibility in the area of the body being studied. CT scans provide greater clarity and reveal more details than regular x-ray exams. Feb 3 @ 10:30 am in [facility].     Follow-Up: At [facility], you and your health needs are our priority.  As part of our continuing mission to provide you with exceptional heart care, we have created designated Provider Care Teams.  These Care Teams include your primary Cardiologist (physician) and Advanced Practice Providers (APPs -  Physician Assistants and Nurse Practitioners) who all work together to provide you with the care you need, when you need it.  We recommend signing up for the patient portal called "MyChart".  Sign up information is provided on this After Visit Summary.  MyChart is used to connect with patients for Virtual Visits (Telemedicine).  Patients are able to view lab/test results, encounter notes, upcoming appointments, etc.  Non-urgent messages can be sent to your provider as well.   To learn more about what you can do with  MyChart, go to [email or web address].    Your next appointment:   1 year(s)  Provider:   Olga Millers, MD    Other Instructions  Your physician wants you to follow-up in: 1 year.  You will receive a reminder letter in the mail two months in advance. If you don't receive a letter, please call our office to schedule the follow-up appointment.

## 2023-02-26 NOTE — Progress Notes (Unsigned)
08/13/22- 62 yoF with concern of shortness of breath Medical problem list includes Depression, GERD, IBS, OSA, CAD,  OSA (OAP/ Dr Myrtis Ser),   -Ventolin hfa, Symbicort 80, Allegra, Flonase,Singulair,  Note lopressor 100 PFT 2019- possible minimal small airway obstruction w/o response to BD HST 05/17/20 ([facility]) AHI 8.9/ hr ECHO 05/12/20- EF 70-75%, otw wnl CT coronary 09/14/21 -Agatson 146 (92 percentile) CBC 07/31/22- Hgb 13.8/ HCT 40.9 She has been concerned about perception that she cannot take a deep satisfying breath and that she is more aware short of breath with exertion over the past 25 years with no acute event.  She had pneumonia at age 27 but otherwise no diagnosed lung disease.  She has seen a pulmonologist many years ago, follows with an allergist and a cardiologist.  Chest x-rays are interpreted as overinflated. She overlaps symptoms of nasal stuffiness and postnasal drip, rare reflux, some sense of chest tightness and occasional mild cough.  Anxiety is increased by the fact that both parents diagnosed Alzheimer's disease and she is worried about her oxygenation. She uses Afrin nasal spray and an inhaler before exercise-seems to help a little. She has chosen not to do anything for now about her mild OSA.  She is followed by cardiology for coronary disease and mild aortic aneurysm. I reviewed her chest x-ray from 2019 with her, pointing out increased anterior mediastinal airspace but no flattening of diaphragms. She has a long lean body habitus and I think the sternal configuration may contribute to some limitation of chest wall movement. CXR 08/12/17- Stable marked hyperinflation consistent with reactive airway disease or COPD. No acute pneumonia nor CHF. CT chest 07/25/21- IMPRESSION: 1. 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 Recommend annual imaging followup by CTA or MRA. This recommendation follows 2010 ACCF/AHA/AATS/ACR/ASA/SCA/SCAI/SIR/STS/SVM Guidelines for the Diagnosis and  Management of Patients with Thoracic Aortic Disease. Circulation. 2010; 121: Z610-R604. Aortic aneurysm NOS (ICD10-I71.9) 2. Multiple scattered pulmonary micronodules. No follow-up needed if patient is low-risk (and has no known or suspected primary neoplasm). Non-contrast chest CT can be considered in 12 months if patient is high-risk. This recommendation follows the consensus statement: Guidelines for Management of Incidental Pulmonary Nodules Detected on CT Images: From the Fleischner Society 2017; Radiology 2017; 284:228-243. 3. Aortic Atherosclerosis (ICD10-I70.0) including left anterior descending coronary calcification. CTa chest Aorta 08/08/22- IMPRESSION: 1. 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 2. Coronary artery calcifications. Aortic Atherosclerosis (ICD10-I70.0). 3. Stable biapical pleuroparenchymal thickening, similar to the 08/2021 examination. 4. Punctate (sub 3 mm) right upper lobe pulmonary nodules are unchanged since the 08/2021 examination. This examination documents 1 year stability. Follow-up examination in 08/2023 would ensure 2 years of stability and thus a benign etiology.  02/27/23- 62 yoF with concern of shortness of breath Medical problem list includes Depression, GERD, IBS, OSA, CAD,  OSA (OAP/ Dr Myrtis Ser),   -Ventolin hfa, Symbicort 80, Allegra, Flonase,Singulair,  Note lopressor 100 Cardiology f/u considered stable-schedule 1 yr f/u. ONOX-08/26/22- 20 seconds </= 89% WNL PFT- 02/27/23-WNL -----Patient had Covid 2weeks ago. Has since recovered and doing much better She focuses on subjective sense of chest tightness. Continues with Pilates and is seeing Dr Myrtis Ser about an oral appliance for mild OSA. I explained that testing has not indicated a problem or dysfunction that I can address. We can see her back in the future if needed. CXR 01/30/23-   IMPRESSION: No active cardiopulmonary disease.  ROS-see HPI   Negative unless "+"  Constitutional:    weight loss, night sweats, fevers, chills, fatigue, lassitude. HEENT:    headaches, difficulty swallowing, tooth/dental problems, sore throat,       sneezing, itching, ear ache, nasal congestion, post nasal drip, snoring CV:    chest pain, orthopnea, PND, swelling in lower extremities, anasarca,                                  dizziness, palpitations Resp:   shortness of breath with exertion or at rest.                productive cough,   non-productive cough, coughing up of blood.              change in color of mucus.  wheezing.   Skin:    rash or lesions. GI:  No-   heartburn, indigestion, abdominal pain, nausea, vomiting, diarrhea,                 change in bowel habits, loss of appetite GU: dysuria, change in color of urine, no urgency or frequency.   flank pain. MS:   joint pain, stiffness, decreased range of motion, back pain. Neuro-     nothing unusual Psych:  change in mood or affect.  depression or anxiety.   memory loss.  OBJ- Physical Exam General- Alert, Oriented, Affect-appropriate, Distress- none acute, +long,lean body habitus Skin- rash-none, lesions- none, excoriation- none Lymphadenopathy- none Head- atraumatic            Eyes- Gross vision intact, PERRLA, conjunctivae and secretions clear            Ears- Hearing, canals-normal            Nose- Clear, no-Septal dev, mucus, polyps, erosion, perforation             Throat- Mallampati II , mucosa clear , drainage- none, tonsils- atrophic Neck- flexible , trachea midline, no stridor , thyroid nl, carotid no bruit Chest - symmetrical excursion , unlabored           Heart/CV- RRR , no murmur , no gallop  , no rub, nl s1 s2                           - JVD- none , edema- none, stasis changes- none, varices- none           Lung- clear to P&A, wheeze- none, cough- none , dullness-none, rub- none           Chest wall-  Abd-   Br/ Gen/ Rectal- Not done, not indicated Extrem- cyanosis- none, clubbing, none, atrophy- none, strength- nl Neuro- grossly intact to observation

## 2023-02-27 ENCOUNTER — Encounter: Payer: Self-pay | Admitting: Internal Medicine

## 2023-02-27 ENCOUNTER — Ambulatory Visit: Payer: Federal, State, Local not specified - PPO | Admitting: Internal Medicine

## 2023-02-27 ENCOUNTER — Ambulatory Visit (INDEPENDENT_AMBULATORY_CARE_PROVIDER_SITE_OTHER): Payer: Federal, State, Local not specified - PPO | Admitting: Internal Medicine

## 2023-02-27 VITALS — BP 114/62 | HR 66 | Ht 67.5 in | Wt 139.6 lb

## 2023-02-27 DIAGNOSIS — G4733 Obstructive sleep apnea (adult) (pediatric): Secondary | ICD-10-CM | POA: Diagnosis not present

## 2023-02-27 DIAGNOSIS — R0609 Other forms of dyspnea: Secondary | ICD-10-CM

## 2023-02-27 LAB — PULMONARY FUNCTION TEST
DL/VA % pred: 111 %
DL/VA: 4.58 ml/min/mmHg/L
DLCO cor % pred: 107 %
DLCO cor: 23.88 ml/min/mmHg
DLCO unc % pred: 111 %
DLCO unc: 24.85 ml/min/mmHg
FEF 25-75 Post: 2.01 L/sec
FEF 25-75 Pre: 1.87 L/sec
FEF2575-%Change-Post: 7 %
FEF2575-%Pred-Post: 81 %
FEF2575-%Pred-Pre: 75 %
FEV1-%Change-Post: 1 %
FEV1-%Pred-Post: 95 %
FEV1-%Pred-Pre: 94 %
FEV1-Post: 2.7 L
FEV1-Pre: 2.66 L
FEV1FVC-%Change-Post: 3 %
FEV1FVC-%Pred-Pre: 93 %
FEV6-%Change-Post: -2 %
FEV6-%Pred-Post: 101 %
FEV6-%Pred-Pre: 103 %
FEV6-Post: 3.57 L
FEV6-Pre: 3.66 L
FEV6FVC-%Change-Post: 0 %
FEV6FVC-%Pred-Post: 103 %
FEV6FVC-%Pred-Pre: 103 %
FVC-%Change-Post: -2 %
FVC-%Pred-Post: 98 %
FVC-%Pred-Pre: 100 %
FVC-Post: 3.59 L
FVC-Pre: 3.66 L
Post FEV1/FVC ratio: 75 %
Post FEV6/FVC ratio: 100 %
Pre FEV1/FVC ratio: 73 %
Pre FEV6/FVC Ratio: 100 %
RV % pred: 110 %
RV: 2.39 L
TLC % pred: 102 %
TLC: 5.67 L

## 2023-02-27 NOTE — Progress Notes (Signed)
Full PFT performed today. °

## 2023-02-27 NOTE — Patient Instructions (Signed)
Full PFT performed today. °

## 2023-02-27 NOTE — Assessment & Plan Note (Addendum)
Suggest aerobic cardio exercise to build stamina

## 2023-02-27 NOTE — Assessment & Plan Note (Signed)
She is seeing Dr Myrtis Ser Orthodontist for oral appliance. Did not like CPAP.

## 2023-02-27 NOTE — Patient Instructions (Signed)
Your tests look normal  I do encourage regular exercise. Relaxation therapies like hypnosis or cognitive behavioral therapy may be useful

## 2023-03-05 ENCOUNTER — Ambulatory Visit
Admission: RE | Admit: 2023-03-05 | Discharge: 2023-03-05 | Disposition: A | Discharge status: Home / Self Care | Payer: Federal, State, Local not specified - PPO | Source: Ambulatory Visit | Attending: Obstetrics and Gynecology | Admitting: Obstetrics and Gynecology

## 2023-03-05 DIAGNOSIS — R928 Other abnormal and inconclusive findings on diagnostic imaging of breast: Secondary | ICD-10-CM

## 2023-03-10 DIAGNOSIS — Z124 Encounter for screening for malignant neoplasm of cervix: Secondary | ICD-10-CM | POA: Diagnosis not present

## 2023-03-14 IMAGING — CT CT CHEST W/O CM
1 series · 14 of 34 positions shown, 18 images · non-contrast
Comparison: None.

CLINICAL DATA: Dyspnea, chronic, unclear etiology. SOB that is
worse at night, COPD



[Series 2: chest w/(date) · axial · 0.64mm/px · z∈[+848,+1146]mm · 14 of 177 slices shown, 18 images]
[im 14/177  mediastinal]
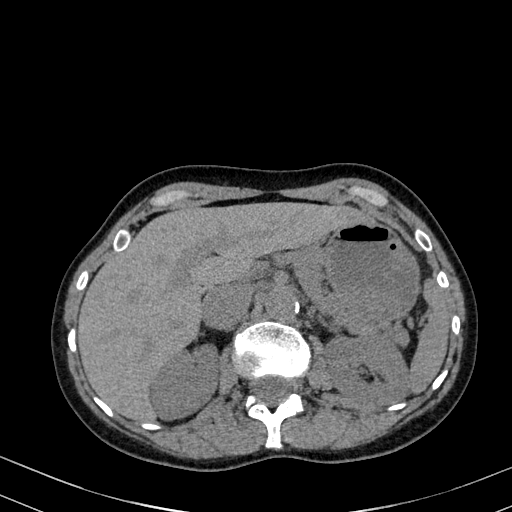
[im 14/177  lung]
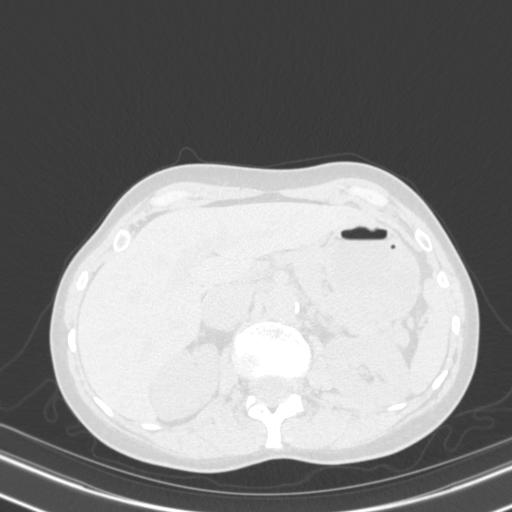
[im 27/177  lung]
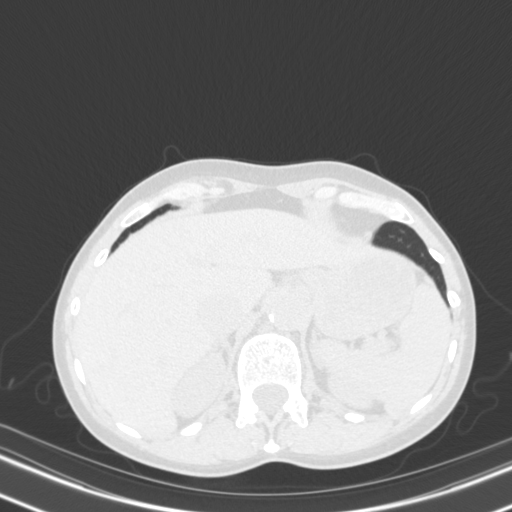
[im 36/177  lung]
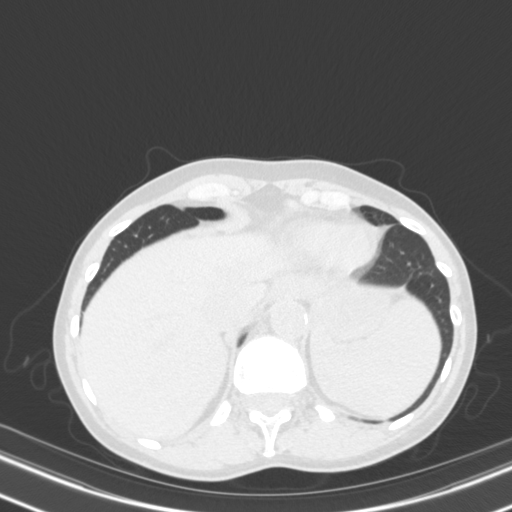
[im 53/177  lung]
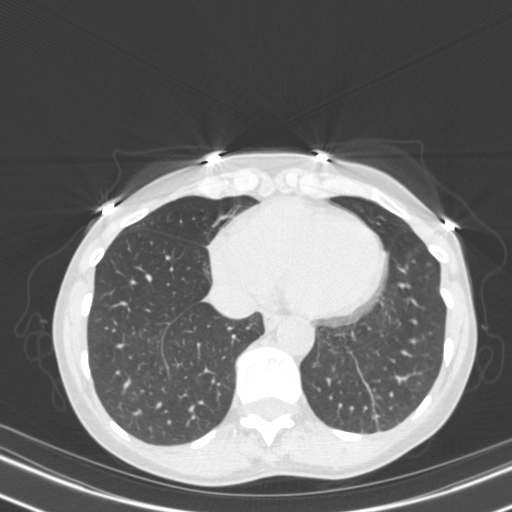
[im 66/177  mediastinal]
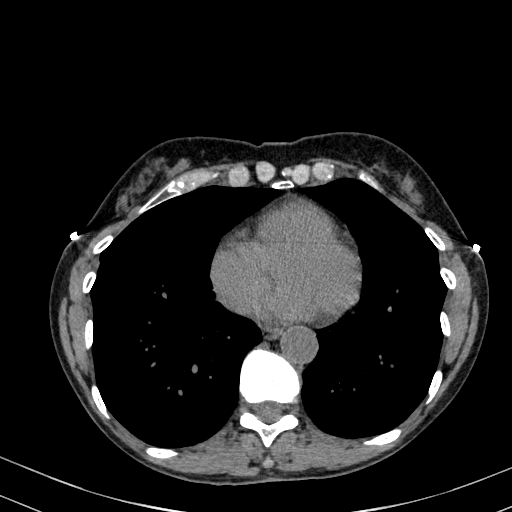
[im 66/177  lung]
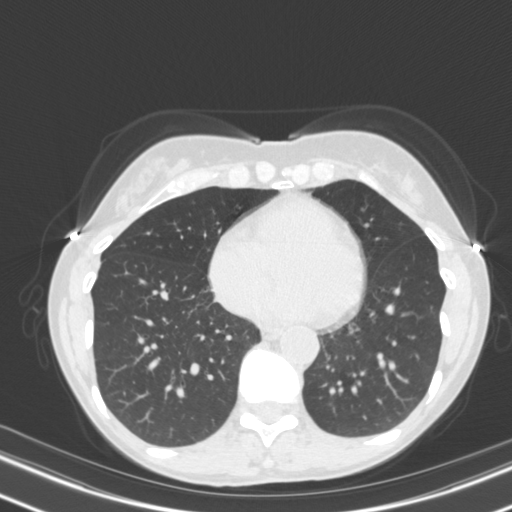
[im 72/177  lung]
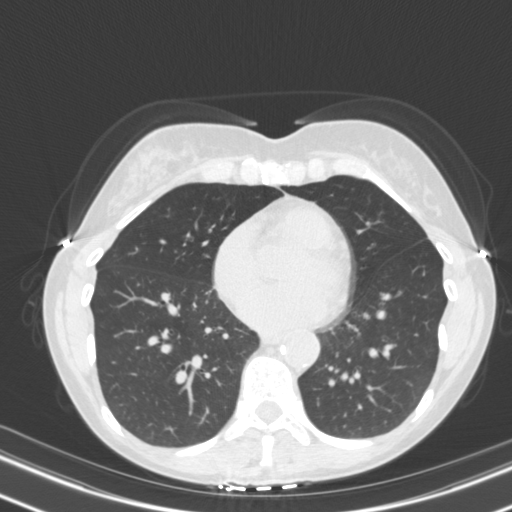
[im 84/177  lung]
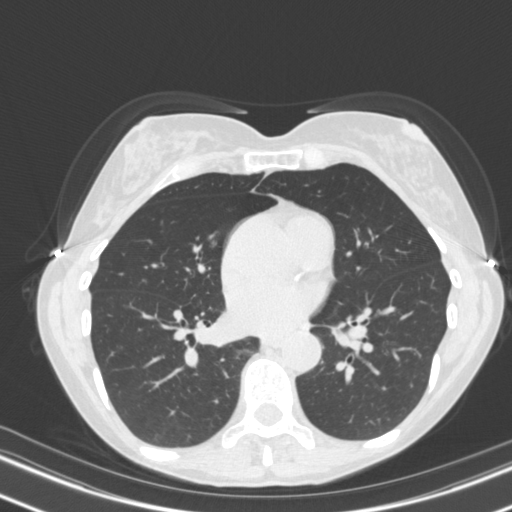
[im 94/177  lung]
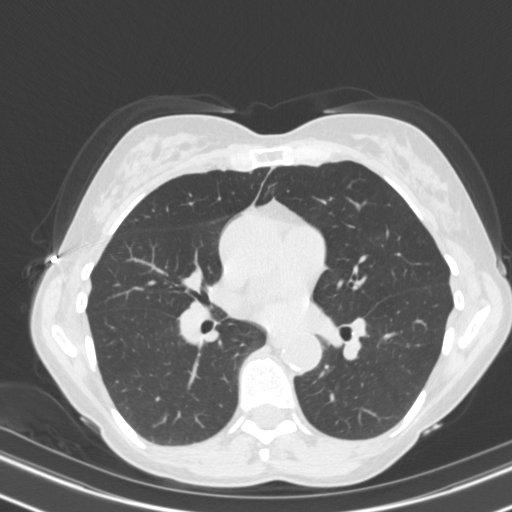
[im 105/177  mediastinal]
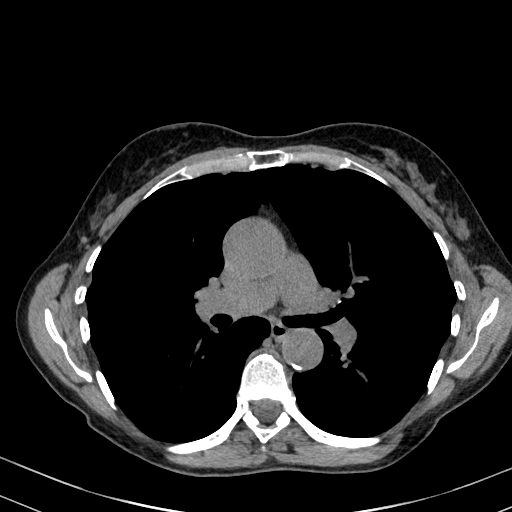
[im 105/177  lung]
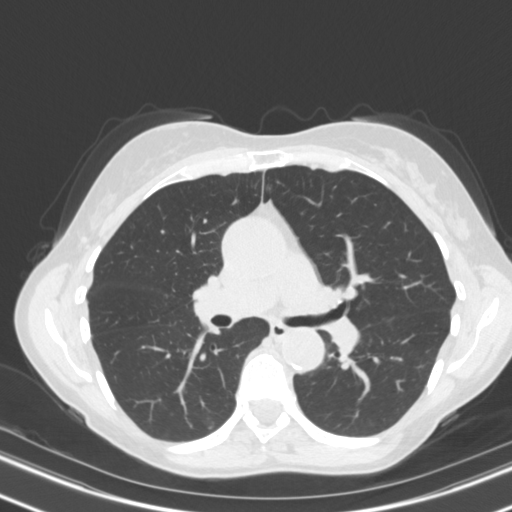
[im 111/177  lung]
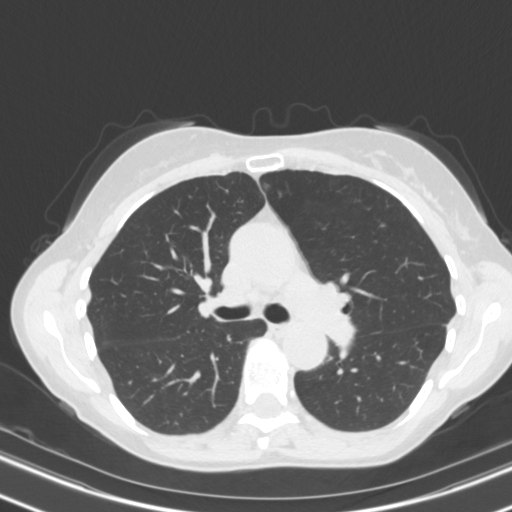
[im 131/177  lung]
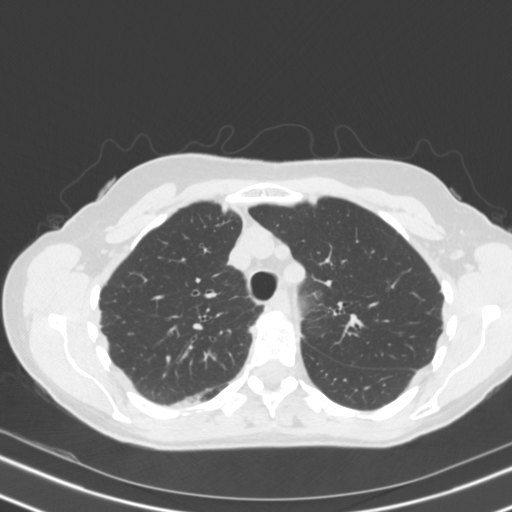
[im 141/177  lung]
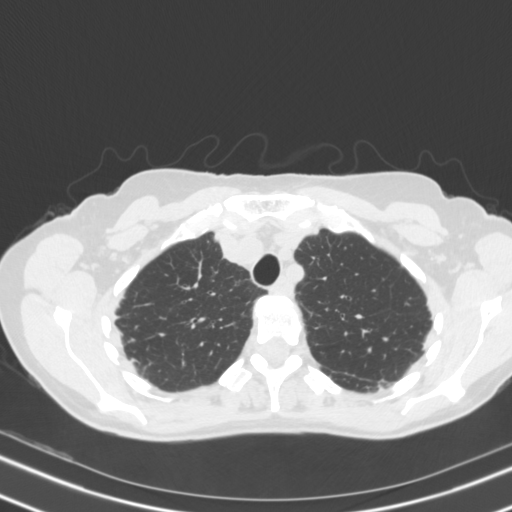
[im 150/177  mediastinal]
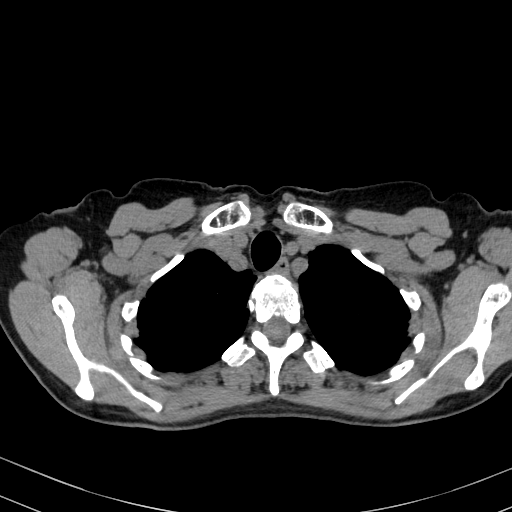
[im 150/177  lung]
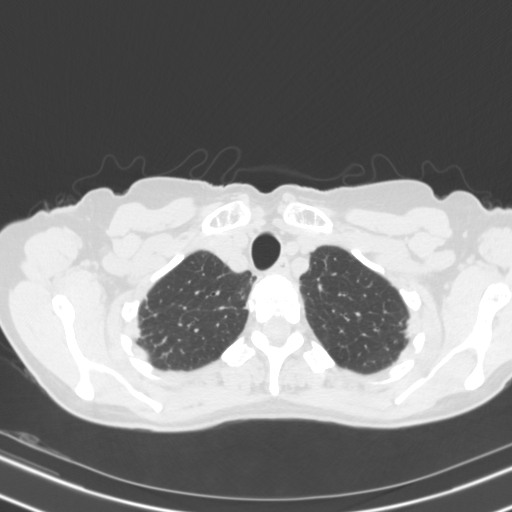
[im 163/177  lung]
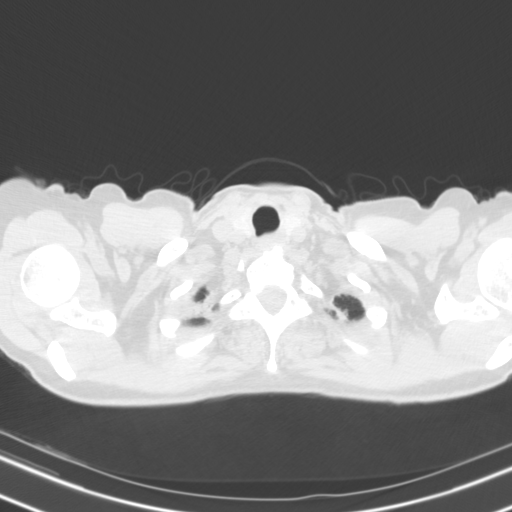

[14 of 34 positions shown; findings below may reference images not displayed]

FINDINGS: Cardiovascular: Normal heart size. No significant pericardial
effusion. The ascending thoracic aorta is enlarged in caliber
measuring up 4.1 cm. At least mild atherosclerotic plaque of the
thoracic aorta. Left anterior descending coronary artery
calcifications.

Mediastinum/Nodes: No gross hilar adenopathy, noting limited
sensitivity for the detection of hilar adenopathy on this
noncontrast study. No enlarged mediastinal or axillary lymph nodes.
Thyroid gland, trachea, and esophagus demonstrate no significant
findings.

Lungs/Pleura: Biapical pleural/pulmonary scarring. No focal
consolidation. Few scattered pulmonary micronodules within the right
lung with largest in the right upper lobe measuring 3 mm ([DATE], 59,
61, 62). There is a left lower lobe pulmonary micronodule measuring
3 mm ([DATE]). There is a left upper lobe pulmonary micronodule
([DATE]). No pulmonary mass. No pleural effusion. No pneumothorax.

Upper Abdomen: Splenule is noted.  No acute abnormality.

Musculoskeletal:

No chest wall abnormality.

No suspicious lytic or blastic osseous lesions. No acute displaced
fracture.
IMPRESSION: 1. Aneurysmal ascending thoracic aorta (4.1 cm). Recommend annual
imaging followup by CTA or MRA. This recommendation follows 0646
ACCF/AHA/AATS/ACR/ASA/SCA/GANDHI/ALHASSAN/NOMASIBULELE/LEAR Guidelines for the
Diagnosis and Management of Patients with Thoracic Aortic Disease.
Circulation. 0646; 121: E266-e369. Aortic aneurysm NOS (NEAPK-AU5.T)
2. Multiple scattered pulmonary micronodules. No follow-up needed if
patient is low-risk (and has no known or suspected primary
neoplasm). Non-contrast chest CT can be considered in 12 months if
patient is high-risk. This recommendation follows the consensus
statement: Guidelines for Management of Incidental Pulmonary Nodules
Detected on CT Images: From the [HOSPITAL] 3979; Radiology
3. Aortic Atherosclerosis (NEAPK-FBO.O) including left anterior
descending coronary calcification.

## 2023-04-17 ENCOUNTER — Ambulatory Visit: Payer: Federal, State, Local not specified - PPO | Admitting: Gastroenterology

## 2023-04-17 ENCOUNTER — Encounter: Payer: Self-pay | Admitting: Gastroenterology

## 2023-04-17 VITALS — BP 130/82 | HR 79 | Temp 98.6°F | Resp 15 | Ht 67.0 in | Wt 138.0 lb

## 2023-04-17 DIAGNOSIS — K2970 Gastritis, unspecified, without bleeding: Secondary | ICD-10-CM

## 2023-04-17 DIAGNOSIS — D123 Benign neoplasm of transverse colon: Secondary | ICD-10-CM | POA: Diagnosis not present

## 2023-04-17 DIAGNOSIS — K2980 Duodenitis without bleeding: Secondary | ICD-10-CM

## 2023-04-17 DIAGNOSIS — D120 Benign neoplasm of cecum: Secondary | ICD-10-CM

## 2023-04-17 DIAGNOSIS — K3189 Other diseases of stomach and duodenum: Secondary | ICD-10-CM | POA: Diagnosis not present

## 2023-04-17 DIAGNOSIS — Z1211 Encounter for screening for malignant neoplasm of colon: Secondary | ICD-10-CM | POA: Diagnosis not present

## 2023-04-17 DIAGNOSIS — K635 Polyp of colon: Secondary | ICD-10-CM | POA: Diagnosis not present

## 2023-04-17 DIAGNOSIS — K219 Gastro-esophageal reflux disease without esophagitis: Secondary | ICD-10-CM

## 2023-04-17 MED ORDER — SODIUM CHLORIDE 0.9 % IV SOLN: 500.0000 mL | INTRAVENOUS | Status: AC

## 2023-04-17 MED ORDER — OMEPRAZOLE 20 MG PO CPDR: DELAYED_RELEASE_CAPSULE | ORAL | 1 refills | Status: AC

## 2023-04-17 NOTE — Progress Notes (Signed)
Called to room to assist during endoscopic procedure.  Patient ID and intended procedure confirmed with present staff. Received instructions for my participation in the procedure from the performing physician.  

## 2023-04-17 NOTE — Patient Instructions (Addendum)
YOU HAD AN ENDOSCOPIC PROCEDURE TODAY AT THE [facility]:   Refer to the procedure report that was given to you for any specific questions about what was found during the examination.  If the procedure report does not answer your questions, please call your gastroenterologist to clarify.  If you requested that your care partner not be given the details of your procedure findings, then the procedure report has been included in a sealed envelope for you to review at your convenience later.  YOU SHOULD EXPECT: Some feelings of bloating in the abdomen. Passage of more gas than usual.  Walking can help get rid of the air that was put into your GI tract during the procedure and reduce the bloating. If you had a lower endoscopy (such as a colonoscopy or flexible sigmoidoscopy) you may notice spotting of blood in your stool or on the toilet paper. If you underwent a bowel prep for your procedure, you may not have a normal bowel movement for a few days.  Please Note:  You might notice some irritation and congestion in your nose or some drainage.  This is from the oxygen used during your procedure.  There is no need for concern and it should clear up in a day or so.  SYMPTOMS TO REPORT IMMEDIATELY:  Following lower endoscopy (colonoscopy or flexible sigmoidoscopy):  Excessive amounts of blood in the stool  Significant tenderness or worsening of abdominal pains  Swelling of the abdomen that is new, acute  Fever of 100F or higher  Following upper endoscopy (EGD)  Vomiting of blood or coffee ground material  New chest pain or pain under the shoulder blades  Painful or persistently difficult swallowing  New shortness of breath  Fever of 100F or higher  Black, tarry-looking stools  For urgent or emergent issues, a gastroenterologist can be reached at any hour by calling [phone]. Do not use MyChart messaging for urgent concerns.    DIET:  We do recommend a small meal at first, but  then you may proceed to your regular diet.  Drink plenty of fluids but you should avoid alcoholic beverages for 24 hours.  MEDICATIONS: Continue present medications. Use Omeprazole 20 mg by mouth daily for 4-6 weeks and then as needed thereafter. Also minimize use of Non-steroidal anti-inflammatory drugs (NSAIDs).  Please see handouts given to you by your recovery nurse: Polyps, Diverticulosis, Gastritis, Hiatal Hernia..  FOLLOW UP: Await pathology results.  Thank you for allowing [place] to provide for your healthcare needs today.  ACTIVITY:  You should plan to take it easy for the rest of today and you should NOT DRIVE or use heavy machinery until tomorrow (because of the sedation medicines used during the test).    FOLLOW UP: Our staff will call the number listed on your records the next business day following your procedure.  We will call around 7:15- 8:00 am to check on you and address any questions or concerns that you may have regarding the information given to you following your procedure. If we do not reach you, we will leave a message.     If any biopsies were taken you will be contacted by phone or by letter within the next 1-3 weeks.  Please call us at [phone] if you have not heard about the biopsies in 3 weeks.    SIGNATURES/CONFIDENTIALITY: You and/or your care partner have signed paperwork which will be entered into your electronic medical record.  These signatures attest to the fact  that that the information above on your After Visit Summary has been reviewed and is understood.  Full responsibility of the confidentiality of this discharge information lies with you and/or your care-partner.

## 2023-04-17 NOTE — Progress Notes (Signed)
Sedate, gd SR, tolerated procedure well, VSS, report to RN 

## 2023-04-17 NOTE — Op Note (Signed)
[facility] Patient Name: Renee [name] Procedure Date: 04/17/2023 2:07 PM MRN: [id number] Endoscopist: Viviann Spare P. Adela Lank , MD, [id number] Age: 62 Referring MD:  Date of Birth: 1960/07/09 Gender: Female Account #: [id number] Procedure:                Colonoscopy Indications:              Screening for colorectal malignant neoplasm Medicines:                Monitored Anesthesia Care Procedure:                Pre-Anesthesia Assessment:                           - Prior to the procedure, a History and Physical                            was performed, and patient medications and                            allergies were reviewed. The patient's tolerance of                            previous anesthesia was also reviewed. The risks                            and benefits of the procedure and the sedation                            options and risks were discussed with the patient.                            All questions were answered, and informed consent                            was obtained. Prior Anticoagulants: The patient has                            taken no anticoagulant or antiplatelet agents. ASA                            Grade Assessment: II - A patient with mild systemic                            disease. After reviewing the risks and benefits,                            the patient was deemed in satisfactory condition to                            undergo the procedure.                           After obtaining informed consent, the colonoscope  was passed under direct vision. Throughout the                            procedure, the patient's blood pressure, pulse, and                            oxygen saturations were monitored continuously. The                            Olympus Scope [id number] was introduced through the                            anus and advanced to the the cecum, identified by                             appendiceal orifice and ileocecal valve. The                            colonoscopy was performed without difficulty. The                            patient tolerated the procedure well. The quality                            of the bowel preparation was adequate. The                            ileocecal valve, appendiceal orifice, and rectum                            were photographed. Scope In: 2:26:19 PM Scope Out: 2:49:04 PM Scope Withdrawal Time: 0 hours 18 minutes 30 seconds  Total Procedure Duration: 0 hours 22 minutes 45 seconds  Findings:                 The perianal and digital rectal examinations were                            normal.                           A cluster of five sessile polyps were found in the                            cecum. The polyps were 1 to 3 mm in size. These                            polyps were removed with a cold snare. Resection                            and retrieval were complete.                           A 4 to 5 mm polyp was found in the hepatic flexure.  The polyp was flat. The polyp was removed with a                            cold snare. Resection and retrieval were complete.                           Multiple small-mouthed diverticula were found in                            the sigmoid colon.                           Anal papilla(e) were hypertrophied.                           The colon was tortous. The exam was otherwise                            without abnormality. Complications:            No immediate complications. Estimated blood loss:                            Minimal. Estimated Blood Loss:     Estimated blood loss was minimal. Impression:               - Five 1 to 3 mm polyps in the cecum, removed with                            a cold snare. Resected and retrieved.                           - One 4 to 5 mm polyp at the hepatic flexure,                            removed with a cold snare.  Resected and retrieved.                           - Diverticulosis in the sigmoid colon.                           - Anal papilla(e) were hypertrophied.                           - Tortous colon.                           - The examination was otherwise normal. Recommendation:           - Patient has a contact number available for                            emergencies. The signs and symptoms of potential                            delayed complications were discussed with the  patient. Return to normal activities tomorrow.                            Written discharge instructions were provided to the                            patient.                           - Resume previous diet.                           - Continue present medications.                           - Await pathology results. Viviann Spare P. Khushboo Chuck, MD 04/17/2023 2:54:36 PM This report has been signed electronically.

## 2023-04-17 NOTE — Op Note (Signed)
[facility] Patient Name: Renee [name] Procedure Date: 04/17/2023 2:08 PM MRN: [id number] Endoscopist: Viviann Spare P. Adela Lank , MD, [id number] Age: 62 Referring MD:  Date of Birth: 1960/11/25 Gender: Female Account #: [id number] Procedure:                Upper GI endoscopy Indications:              Suspected gastro-esophageal reflux disease,                            occasional postprandial bloating / discomfort - on                            omeprazole as needed, now using pepcid more                            frequently which has helped Medicines:                Monitored Anesthesia Care Procedure:                Pre-Anesthesia Assessment:                           - Prior to the procedure, a History and Physical                            was performed, and patient medications and                            allergies were reviewed. The patient's tolerance of                            previous anesthesia was also reviewed. The risks                            and benefits of the procedure and the sedation                            options and risks were discussed with the patient.                            All questions were answered, and informed consent                            was obtained. Prior Anticoagulants: The patient has                            taken no anticoagulant or antiplatelet agents. ASA                            Grade Assessment: II - A patient with mild systemic                            disease. After reviewing the risks and benefits,  the patient was deemed in satisfactory condition to                            undergo the procedure.                           After obtaining informed consent, the endoscope was                            passed under direct vision. Throughout the                            procedure, the patient's blood pressure, pulse, and                            oxygen saturations were  monitored continuously. The                            Olympus Scope SN [id number] was introduced through the                            mouth, and advanced to the second part of duodenum.                            The upper GI endoscopy was accomplished without                            difficulty. The patient tolerated the procedure                            well. Scope In: Scope Out: Findings:                 Esophagogastric landmarks were identified: the                            Z-line was found at 44 cm, the gastroesophageal                            junction was found at 44 cm and the upper extent of                            the gastric folds was found at 45 cm from the                            incisors.                           A 1 cm hiatal hernia was present.                           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  The exam of the esophagus                            was otherwise normal.                           Patchy mildly erythematous mucosa was found in the                            gastric body and in the gastric antrum.                           The exam of the stomach was otherwise normal.                           Biopsies were taken with a cold forceps for                            Helicobacter pylori testing.                           Diffuse mild inflammation characterized by multiple                            superficial aphthous ulcerations was found in the                            duodenal bulb.                           The exam of the duodenum was otherwise normal.                           Biopsies were taken with a cold forceps in the                            duodenal bulb and in the second portion of the                            duodenum for histology. Complications:            No immediate complications. Estimated blood loss:                             Minimal. Estimated Blood Loss:     Estimated blood loss was minimal. Impression:               - Esophagogastric landmarks identified.                           - 1 cm hiatal hernia.                           - Normal esophagus otherwise - no erosive changes                           -  Erythematous mucosa in the gastric body and                            antrum.                           - Normal stomach otherwise - biopsies taken to rule                            out H pylori                           - Duodenitis.                           - Normal duodenum otherwise - biopsies obtained. Recommendation:           - Patient has a contact number available for                            emergencies. The signs and symptoms of potential                            delayed complications were discussed with the                            patient. Return to normal activities tomorrow.                            Written discharge instructions were provided to the                            patient.                           - Resume previous diet.                           - Continue present medications.                           - Use omeprazole daily                           - Minimize NSAID use.                           - Await pathology results. Viviann Spare P. Jaylynn Siefert, MD 04/17/2023 3:00:34 PM This report has been signed electronically.

## 2023-04-17 NOTE — Progress Notes (Signed)
[facility] History and Physical   Primary Care Physician:  Myrlene Broker, MD   Reason for Procedure:   GERD, Colon cancer screening  Plan:    EGD and colonoscopy     HPI: Renee [name] is a 62 y.o. female  here for colonoscopy screening and EGD to evaluate for history of suspected GERD. On omeprazole and pepcid in the past for GERD.   Patient denies any bowel symptoms at this time. No family history of colon cancer known. Otherwise feels well without any cardiopulmonary symptoms. Pepcid has added helped her reflux symptoms. She does have some postprandial discomfort in her abdomen and bloating that bothers her frequently as well.   I have discussed risks / benefits of anesthesia and endoscopic procedure with Renee [name] and they wish to proceed with the exams as outlined today.    Past Medical History:  Diagnosis Date   Abnormal chest x-ray    CXR c/w COPD. Full PFTs '11 - normal   Allergy    seasonal   Anxiety    Arthritis    both knees, post traumatic - neck   Asthma    Cancer (HCC)    Cataract    removed bilateral   COVID    Depression    with anxiety.   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 attempts at in vitro fertilization   Migraines    during teenage years   MVP (mitral valve prolapse)    last 2D echo approx '10   Neuromuscular disorder (HCC)    Osteopenia    DEXA 03/2012: -1.0   Sleep apnea     Past Surgical History:  Procedure Laterality Date   APPENDECTOMY     BUNIONECTOMY WITH HAMMERTOE RECONSTRUCTION Left 07/06/13   [facility]   CARDIAC CATHETERIZATION  2006   normal study   CARPAL TUNNEL RELEASE  2011   right hand   HYSTEROPLASTY REPAIR OF UTERINE ANOMALY     2003 - laproscopically; 2006 - laparotomy   LAPAROSCOPIC ENDOMETRIOSIS FULGURATION     1996 and 2002   MANDIBLE RECONSTRUCTION      NASAL SEPTUM SURGERY  11/2012   [facility] FINGER RELEASE  2011   R thimb and ring finger   TRIGGER FINGER RELEASE  02/04/2012   [facility], in office    Prior to Admission medications   Medication Sig Start Date End Date Taking? Authorizing Provider  aspirin EC 81 MG tablet Take 1 tablet (81 mg total) by mouth daily. Swallow whole. 09/03/21  Yes Alver Sorrow, NP  Cholecalciferol (VITAMIN D3) 50 MCG (2000 UT) capsule Take 2,000 Units by mouth daily.   Yes [provider]  DIGESTIVE ENZYMES PO Take 1 tablet by mouth daily as needed.   Yes [provider]  famotidine (PEPCID) 40 MG tablet Take 1 tablet (40 mg total) by mouth daily. 02/20/23  Yes Zehr, Princella Pellegrini, PA-C  fluticasone (FLONASE) 50 MCG/ACT nasal spray SPRAY 2 SPRAYS INTO EACH NOSTRIL EVERY DAY 04/10/21  Yes Myrlene Broker, MD  levocetirizine (XYZAL ALLERGY 24HR) 5 MG tablet Take 5 mg by mouth every evening.   Yes [provider]  magnesium oxide (MAG-OX) 400 (240 Mg) MG tablet Take 400 mg by mouth daily.   Yes [provider]  Omega-3 Fatty Acids (FISH OIL TRIPLE  STRENGTH) 1400 MG CAPS Take by mouth daily as needed.   Yes [provider]  Probiotic Product (PROBIOTIC DAILY PO) Take 1 tablet by mouth daily.   Yes [provider]  rosuvastatin (CRESTOR) 40 MG tablet TAKE 1 TABLET BY MOUTH EVERY DAY 09/12/22  Yes Lewayne Bunting, MD  sertraline (ZOLOFT) 100 MG tablet  12/11/22  Yes [provider]  vitamin E 180 MG (400 UNITS) capsule Take 400 Units by mouth daily.   Yes [provider]  albuterol (VENTOLIN HFA) 108 (90 Base) MCG/ACT inhaler Inhale 2 puffs into the lungs every 4 (four) hours as needed for wheezing or shortness of breath. 11/07/22   Alfonse Spruce, MD  cloNIDine (CATAPRES) 0.1 MG tablet Take 0.1 mg by mouth daily. 02/19/23   [provider]  metroNIDAZOLE (METROCREAM) 0.75 % cream Apply 1 application topically 2 (two) times daily.  08/01/20   [provider]  omeprazole (PRILOSEC) 20 MG capsule Take 20 mg by mouth daily as needed.    [provider]  Plant Sterols and Stanols (CHOLESTOFF PLUS PO) Take by mouth.    [provider]  SYMBICORT 80-4.5 MCG/ACT inhaler Inhale 2 puffs into the lungs 2 (two) times daily as needed. 11/07/22   Alfonse Spruce, MD    Current Outpatient Medications  Medication Sig Dispense Refill   aspirin EC 81 MG tablet Take 1 tablet (81 mg total) by mouth daily. Swallow whole. 90 tablet 3   Cholecalciferol (VITAMIN D3) 50 MCG (2000 UT) capsule Take 2,000 Units by mouth daily.     DIGESTIVE ENZYMES PO Take 1 tablet by mouth daily as needed.     famotidine (PEPCID) 40 MG tablet Take 1 tablet (40 mg total) by mouth daily. 90 tablet 3   fluticasone (FLONASE) 50 MCG/ACT nasal spray SPRAY 2 SPRAYS INTO EACH NOSTRIL EVERY DAY 48 mL 3   levocetirizine (XYZAL ALLERGY 24HR) 5 MG tablet Take 5 mg by mouth every evening.     magnesium oxide (MAG-OX) 400 (240 Mg) MG tablet Take 400 mg by mouth daily.     Omega-3 Fatty Acids (FISH OIL TRIPLE STRENGTH) 1400 MG CAPS Take by mouth daily as needed.     Probiotic Product (PROBIOTIC DAILY PO) Take 1 tablet by mouth daily.     rosuvastatin (CRESTOR) 40 MG tablet TAKE 1 TABLET BY MOUTH EVERY DAY 90 tablet 2   sertraline (ZOLOFT) 100 MG tablet      vitamin E 180 MG (400 UNITS) capsule Take 400 Units by mouth daily.     albuterol (VENTOLIN HFA) 108 (90 Base) MCG/ACT inhaler Inhale 2 puffs into the lungs every 4 (four) hours as needed for wheezing or shortness of breath. 18 g 1   cloNIDine (CATAPRES) 0.1 MG tablet Take 0.1 mg by mouth daily.     metroNIDAZOLE (METROCREAM) 0.75 % cream Apply 1 application topically 2 (two) times daily.     omeprazole (PRILOSEC) 20 MG capsule Take 20 mg by mouth daily as needed.     Plant Sterols and Stanols (CHOLESTOFF PLUS PO) Take by mouth.     SYMBICORT 80-4.5 MCG/ACT inhaler Inhale 2 puffs into the lungs  2 (two) times daily as needed. 10.2 g 2   Current Facility-Administered Medications  Medication Dose Route Frequency Provider Last Rate Last Admin   0.9 %  sodium chloride infusion  500 mL Intravenous Continuous Airika Alkhatib, Willaim Rayas, MD        Allergies as of 04/17/2023 - Review  Complete 04/17/2023  Allergen Reaction Noted   Codeine Other (See Comments) 09/19/2010   Pollen extract Itching and Cough 11/11/2011   Anesthetics, amide  04/15/2023    Family History  Problem Relation Age of Onset   Allergic rhinitis Mother    Arthritis Mother        DOB 13   Cancer Mother        Thyroid cancer   Hypertension Mother    Atrial fibrillation Mother    Melanoma Mother    Breast cancer Mother    Arthritis Father        DOB 1929   Melanoma Father    Allergic rhinitis Sister    Colon polyps Neg Hx    Rectal cancer Neg Hx    Stomach cancer Neg Hx    Colon cancer Neg Hx     Social History   Socioeconomic History   Marital status: Married    Spouse name: Not on file   Number of children: 0   Years of education: 16   Highest education level: Bachelor's degree (e.g., BA, AB, BS)  Occupational History   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   Married '94.   Denies any physical or sexual abuse.   Family in [place]: parents and sister.   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   Has smoke alarms, wears seatbelt at all times, uses helmut when appropriate, firearms present in the home, uses herbal remedies, caffeine- 2-3 per day.   No regular exercise.   Social  Determinants of Health   Financial Resource Strain: Low Risk  (02/18/2023)   Overall Financial Resource Strain (CARDIA)    Difficulty of Paying Living Expenses: Not hard at all  Food Insecurity: No Food Insecurity (02/18/2023)   Hunger Vital Sign    Worried About Running Out of Food in the Last Year: Never true    Ran Out of Food in the Last Year: Never true  Transportation Needs: No Transportation Needs (02/18/2023)   PRAPARE - Administrator, Civil Service (Medical): No    Lack of Transportation (Non-Medical): No  Physical Activity: Insufficiently Active (02/18/2023)   Exercise Vital Sign    Days of Exercise per Week: 2 days    Minutes of Exercise per Session: 60 min  Stress: No Stress Concern Present (02/18/2023)   [employer] of Occupational Health - Occupational Stress Questionnaire    Feeling of Stress : Only a little  Social Connections: Moderately Integrated (02/18/2023)   Social Connection and Isolation Panel [NHANES]    Frequency of Communication with Friends and Family: More than three times a week    Frequency of Social Gatherings with Friends and Family: Once a week    Attends Religious Services: More than 4 times per year    Active Member of [employer] or Organizations: No    Attends Engineer, structural: Not on file    Marital Status: Married  Catering manager Violence: Not on file  Review of Systems: All other review of systems negative except as mentioned in the HPI.  Physical Exam: Vital signs BP 134/82   Pulse 87   Temp 98.6 F (37 C) (Temporal)   Ht 5\' 7"  (1.702 m)   Wt 138 lb (62.6 kg)   SpO2 99%   BMI 21.61 kg/m   General:   Alert,  Well-developed, pleasant and cooperative in NAD Lungs:  Clear throughout to auscultation.   Heart:  Regular rate and rhythm Abdomen:  Soft, nontender and nondistended.   Neuro/Psych:  Alert and cooperative. Normal mood and affect. A and O x 3  Harlin Rain, MD [facility]

## 2023-04-18 ENCOUNTER — Telehealth: Payer: Self-pay

## 2023-04-18 NOTE — Telephone Encounter (Signed)
  Follow up Call-     04/17/2023    1:17 PM  Call back number  Post procedure Call Back phone  # [phone]  Permission to leave phone message Yes     Patient questions:  Do you have a fever, pain , or abdominal swelling? No. Pain Score  0 *  Have you tolerated food without any problems? Yes.    Have you been able to return to your normal activities? Yes.    Do you have any questions about your discharge instructions: Diet   No. Medications  No. Follow up visit  No.  Do you have questions or concerns about your Care? No.  Actions: * If pain score is 4 or above: No action needed, pain <4.

## 2023-04-22 LAB — SURGICAL PATHOLOGY

## 2023-05-06 DIAGNOSIS — Z85828 Personal history of other malignant neoplasm of skin: Secondary | ICD-10-CM | POA: Diagnosis not present

## 2023-05-06 DIAGNOSIS — L718 Other rosacea: Secondary | ICD-10-CM | POA: Diagnosis not present

## 2023-05-06 DIAGNOSIS — L820 Inflamed seborrheic keratosis: Secondary | ICD-10-CM | POA: Diagnosis not present

## 2023-05-06 DIAGNOSIS — D485 Neoplasm of uncertain behavior of skin: Secondary | ICD-10-CM | POA: Diagnosis not present

## 2023-05-06 DIAGNOSIS — L570 Actinic keratosis: Secondary | ICD-10-CM | POA: Diagnosis not present

## 2023-06-01 ENCOUNTER — Encounter: Payer: Self-pay | Admitting: Internal Medicine

## 2023-06-02 MED ORDER — SERTRALINE HCL 100 MG PO TABS: 100.0000 mg | ORAL_TABLET | Freq: Every day | ORAL | 3 refills | Status: DC

## 2023-07-25 ENCOUNTER — Ambulatory Visit: Payer: Self-pay | Admitting: Internal Medicine

## 2023-07-25 NOTE — Telephone Encounter (Signed)
Copied from CRM [id number]. Topic: Clinical - Medical Advice >> Jul 25, 2023  2:56 PM Tiffany H wrote: Reason for CRM: Reason for CRM: Patient called to advise that her daughter went to Urgent Care and was told to have all people she recently came in contact with to request a preventative prescription for Tamiflu. Please send the prescription to the below pharmacy. Patient advised no active symptoms. Please assist.   [id number] [place], [place] - [address], [place] [address] Phone: [phone]  Fax: [phone] DEA #: [id number]

## 2023-07-25 NOTE — Telephone Encounter (Signed)
No symptoms Daughter was taken this morning

## 2023-07-28 MED ORDER — OSELTAMIVIR PHOSPHATE 75 MG PO CAPS: 75.0000 mg | ORAL_CAPSULE | Freq: Two times a day (BID) | ORAL | 0 refills | Status: AC

## 2023-07-28 NOTE — Telephone Encounter (Signed)
Have sent in but did not see until today I am unsure if there is benefit to taking this far out from exposure.

## 2023-07-28 NOTE — Addendum Note (Signed)
Addended by: Myrlene Broker on: 07/28/2023 07:48 AM   Modules accepted: Orders

## 2023-08-04 ENCOUNTER — Other Ambulatory Visit (HOSPITAL_BASED_OUTPATIENT_CLINIC_OR_DEPARTMENT_OTHER): Payer: Self-pay | Admitting: Cardiology

## 2023-08-04 ENCOUNTER — Ambulatory Visit (HOSPITAL_BASED_OUTPATIENT_CLINIC_OR_DEPARTMENT_OTHER)
Admission: RE | Admit: 2023-08-04 | Discharge: 2023-08-04 | Disposition: A | Discharge status: Home / Self Care | Payer: Federal, State, Local not specified - PPO | Source: Ambulatory Visit | Attending: Cardiology | Admitting: Cardiology

## 2023-08-04 DIAGNOSIS — E785 Hyperlipidemia, unspecified: Secondary | ICD-10-CM | POA: Insufficient documentation

## 2023-08-04 DIAGNOSIS — I77819 Aortic ectasia, unspecified site: Secondary | ICD-10-CM | POA: Diagnosis not present

## 2023-08-04 DIAGNOSIS — R911 Solitary pulmonary nodule: Secondary | ICD-10-CM | POA: Diagnosis not present

## 2023-08-04 DIAGNOSIS — I700 Atherosclerosis of aorta: Secondary | ICD-10-CM | POA: Diagnosis not present

## 2023-08-04 DIAGNOSIS — I25118 Atherosclerotic heart disease of native coronary artery with other forms of angina pectoris: Secondary | ICD-10-CM | POA: Diagnosis not present

## 2023-08-04 DIAGNOSIS — I7121 Aneurysm of the ascending aorta, without rupture: Secondary | ICD-10-CM | POA: Diagnosis not present

## 2023-08-07 NOTE — Progress Notes (Signed)
 [name] [address] [place] [address] Phone: [phone] Subjective:   Renee [name], am serving as a scribe for Dr. Arthea Claudene.  I'm seeing this patient by the request  of:  Rollene Almarie LABOR, MD  CC: Left knee pain  [name]  Renee [name] is a 63 y.o. female coming in with complaint of L knee pain. Last seen in April 2024 for elbow and shoulder pain. Patient states that she has chronic knee pain but pain increased in October in both knees after traveling to the beach. Over past 2 weeks L knee has become swollen. Able to exercise but wears a brace and ices after activity. Pain surround patella on both knees. Using Spenco Total Support Orthotics.       Past Medical History:  Diagnosis Date   Abnormal chest x-ray    CXR c/w COPD. Full PFTs '11 - normal   Allergy     seasonal   Anxiety    Arthritis    both knees, post traumatic - neck   Asthma    Cancer (HCC)    Cataract    removed bilateral   COVID    Depression    with anxiety.   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 attempts at in vitro fertilization   Migraines    during teenage years   MVP (mitral valve prolapse)    last 2D echo approx '10   Neuromuscular disorder (HCC)    Osteopenia    DEXA 03/2012: -1.0   Sleep apnea    Past Surgical History:  Procedure Laterality Date   APPENDECTOMY     BUNIONECTOMY WITH HAMMERTOE RECONSTRUCTION Left 07/06/13   [facility]   CARDIAC CATHETERIZATION  2006   normal study   CARPAL TUNNEL RELEASE  2011   right hand   HYSTEROPLASTY REPAIR OF UTERINE ANOMALY     2003 - laproscopically; 2006 - laparotomy   LAPAROSCOPIC ENDOMETRIOSIS FULGURATION     1996 and 2002   MANDIBLE RECONSTRUCTION     NASAL SEPTUM SURGERY  11/2012   [facility] FINGER RELEASE  2011   R thimb and ring finger    TRIGGER FINGER RELEASE  02/04/2012   [place], in office   Social History   Socioeconomic History   Marital status: Married    Spouse name: Not on file   Number of children: 0   Years of education: 16   Highest education level: Bachelor's degree (e.g., BA, AB, BS)  Occupational History   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   Married '94.   Denies any physical or sexual abuse.   Family in [place]: parents and sister.   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   Has smoke alarms, wears seatbelt at all times, uses helmut when appropriate, firearms  present in the home, uses herbal remedies, caffeine- 2-3 per day.   No regular exercise.   Social Drivers of Corporate Investment Banker Strain: Low Risk  (02/18/2023)   Overall Financial Resource Strain (CARDIA)    Difficulty of Paying Living Expenses: Not hard at all  Food Insecurity: No Food Insecurity (02/18/2023)   Hunger Vital Sign    Worried About Running Out of Food in the Last Year: Never true    Ran Out of Food in the Last Year: Never true  Transportation Needs: No Transportation Needs (02/18/2023)   PRAPARE - Administrator, Civil Service (Medical): No    Lack of Transportation (Non-Medical): No  Physical Activity: Insufficiently Active (02/18/2023)   Exercise Vital Sign    Days of Exercise per Week: 2 days    Minutes of Exercise per Session: 60 min  Stress: No Stress Concern Present (02/18/2023)   [employer] of Occupational Health - Occupational Stress Questionnaire    Feeling of Stress : Only  a little  Social Connections: Moderately Integrated (02/18/2023)   Social Connection and Isolation Panel [NHANES]    Frequency of Communication with Friends and Family: More than three times a week    Frequency of Social Gatherings with Friends and Family: Once a week    Attends Religious Services: More than 4 times per year    Active Member of [employer] or Organizations: No    Attends Engineer, Structural: Not on file    Marital Status: Married   Allergies  Allergen Reactions   Codeine Other (See Comments)    Hot flashes/passed out   Pollen Extract Itching and Cough    drainage   Anesthetics, Amide    Family History  Problem Relation Age of Onset   Allergic rhinitis Mother    Arthritis Mother        DOB 62   Cancer Mother        Thyroid  cancer   Hypertension Mother    Atrial fibrillation Mother    Melanoma Mother    Breast cancer Mother    Arthritis Father        DOB 1929   Melanoma Father    Allergic rhinitis Sister    Colon polyps Neg Hx    Rectal cancer Neg Hx    Stomach cancer Neg Hx    Colon cancer Neg Hx      Current Outpatient Medications (Cardiovascular):    cloNIDine (CATAPRES) 0.1 MG tablet, Take 0.1 mg by mouth daily.   rosuvastatin  (CRESTOR ) 40 MG tablet, TAKE 1 TABLET BY MOUTH EVERY DAY  Current Outpatient Medications (Respiratory):    albuterol  (VENTOLIN  HFA) 108 (90 Base) MCG/ACT inhaler, Inhale 2 puffs into the lungs every 4 (four) hours as needed for wheezing or shortness of breath.   fluticasone  (FLONASE ) 50 MCG/ACT nasal spray, SPRAY 2 SPRAYS INTO EACH NOSTRIL EVERY DAY   levocetirizine (XYZAL ALLERGY  24HR) 5 MG tablet, Take 5 mg by mouth every evening.   SYMBICORT  80-4.5 MCG/ACT inhaler, Inhale 2 puffs into the lungs 2 (two) times daily as needed.  Current Outpatient Medications (Analgesics):    aspirin  EC 81 MG tablet, Take 1 tablet (81 mg total) by mouth daily. Swallow whole.   Current Outpatient Medications (Other):     Cholecalciferol (VITAMIN D3) 50 MCG (2000 UT) capsule, Take 2,000 Units by mouth daily.   DIGESTIVE ENZYMES PO, Take 1 tablet by mouth daily as needed.   famotidine  (PEPCID ) 40 MG tablet, Take  1 tablet (40 mg total) by mouth daily.   magnesium oxide (MAG-OX) 400 (240 Mg) MG tablet, Take 400 mg by mouth daily.   metroNIDAZOLE (METROCREAM) 0.75 % cream, Apply 1 application topically 2 (two) times daily.   Omega-3 Fatty Acids (FISH OIL TRIPLE STRENGTH) 1400 MG CAPS, Take by mouth daily as needed.   omeprazole  (PRILOSEC) 20 MG capsule, Take Omeprazole  20 mg by mouth daily for 4-6 weeks, and then as needed thereafter.   Plant Sterols and Stanols (CHOLESTOFF PLUS PO), Take by mouth.   Probiotic Product (PROBIOTIC DAILY PO), Take 1 tablet by mouth daily.   sertraline  (ZOLOFT ) 100 MG tablet, Take 1 tablet (100 mg total) by mouth daily.   vitamin E 180 MG (400 UNITS) capsule, Take 400 Units by mouth daily.   Reviewed prior external information including notes and imaging from  primary care provider As well as notes that were available from care everywhere and other healthcare systems.  Past medical history, social, surgical and family history all reviewed in electronic medical record.  No pertanent information unless stated regarding to the chief complaint.   Review of Systems:  No headache, visual changes, nausea, vomiting, diarrhea, constipation, dizziness, abdominal pain, skin rash, fevers, chills, night sweats, weight loss, swollen lymph nodes, body aches, joint swelling, chest pain, shortness of breath, mood changes. POSITIVE muscle aches  Objective  Blood pressure 120/66, pulse 60, height 5' 7 (1.702 m), weight 147 lb (66.7 kg), SpO2 97%.   General: No apparent distress alert and oriented x3 mood and affect normal, dressed appropriately.  HEENT: Pupils equal, extraocular movements intact  Respiratory: Patient's speak in full sentences and does not appear short of breath  Cardiovascular: No  lower extremity edema, non tender, no erythema  Knee exam shows effusion noted of the left knee compared to the right knee.  Crepitus noted to both knees.  Seems to be significantly left worse along the left knee.  Limited muscular skeletal ultrasound was performed and interpreted by CLAUDENE HUSSAR, M  Significant swelling and what appears to be more of a synovitis noted.  Fairly significant with mild increase in Doppler flow as well.  Some mild to moderate arthritic changes noted. Impression: Synovitis of the left knee and arthritis of the right knee  After informed written and verbal consent, patient was seated on exam table. Right knee was prepped with alcohol swab and utilizing anterolateral approach, patient's right knee space was injected with 4:1  marcaine 0.5%: Kenalog  40mg /dL. Patient tolerated the procedure well without immediate complications.  After informed written and verbal consent, patient was seated on exam table. Left knee was prepped with alcohol swab and utilizing anterolateral approach, patient's left knee space was injected with 4:1  marcaine 0.5%: Kenalog  40mg /dL. Patient tolerated the procedure well without immediate complications.    Impression and Recommendations:     Synovitis of left knee Patient given injection and tolerated the procedure well.  Discussed icing regimen and home exercises.  Discussed which activities to do and which ones to avoid.  I do feel that laboratory workup is necessary with the cellulitis again and is close patient has significant difficulty healing from the partial tear of the rotator cuff.  Will follow-up with me again in 4 to 6 weeks worsening pain advanced imaging may be warranted  Partial tear of right rotator cuff Wanted to continue to monitor.  Still having some difficulty.  Has been dealing with it for years.  Had quickly some hypoechoic changes of the bicep  tendon.  We discussed compression sleeve at this time.  Likely does have a  labral pathology.  Would consider another injection if needed with it being nearly 1 year since the previous injection.   The above documentation has been reviewed and is accurate and complete Eula Jaster M Melani Brisbane, DO

## 2023-08-08 ENCOUNTER — Other Ambulatory Visit: Payer: Self-pay

## 2023-08-08 ENCOUNTER — Ambulatory Visit: Payer: Federal, State, Local not specified - PPO | Admitting: Family Medicine

## 2023-08-08 ENCOUNTER — Encounter: Payer: Self-pay | Admitting: Family Medicine

## 2023-08-08 VITALS — BP 120/66 | HR 60 | Ht 67.0 in | Wt 147.0 lb

## 2023-08-08 DIAGNOSIS — M25562 Pain in left knee: Secondary | ICD-10-CM | POA: Diagnosis not present

## 2023-08-08 DIAGNOSIS — M170 Bilateral primary osteoarthritis of knee: Secondary | ICD-10-CM

## 2023-08-08 DIAGNOSIS — M65962 Unspecified synovitis and tenosynovitis, left lower leg: Secondary | ICD-10-CM | POA: Diagnosis not present

## 2023-08-08 DIAGNOSIS — M2550 Pain in unspecified joint: Secondary | ICD-10-CM | POA: Diagnosis not present

## 2023-08-08 DIAGNOSIS — M25561 Pain in right knee: Secondary | ICD-10-CM | POA: Diagnosis not present

## 2023-08-08 DIAGNOSIS — M75111 Incomplete rotator cuff tear or rupture of right shoulder, not specified as traumatic: Secondary | ICD-10-CM

## 2023-08-08 LAB — COMPREHENSIVE METABOLIC PANEL
ALT: 16 U/L (ref 0–35)
AST: 22 U/L (ref 0–37)
Albumin: 4.5 g/dL (ref 3.5–5.2)
Alkaline Phosphatase: 68 U/L (ref 39–117)
BUN: 17 mg/dL (ref 6–23)
CO2: 29 meq/L (ref 19–32)
Calcium: 9.3 mg/dL (ref 8.4–10.5)
Chloride: 104 meq/L (ref 96–112)
Creatinine, Ser: 0.84 mg/dL (ref 0.40–1.20)
GFR: 74.62 mL/min (ref >60.00)
Glucose, Bld: 77 mg/dL (ref 70–99)
Potassium: 4 meq/L (ref 3.5–5.1)
Sodium: 142 meq/L (ref 135–145)
Total Bilirubin: 0.8 mg/dL (ref 0.2–1.2)
Total Protein: 7.2 g/dL (ref 6.0–8.3)

## 2023-08-08 LAB — IBC PANEL
Iron: 84 ug/dL (ref 42–145)
Saturation Ratios: 25.6 % (ref 20.0–50.0)
TIBC: 327.6 ug/dL (ref 250.0–450.0)
Transferrin: 234 mg/dL (ref 212.0–360.0)

## 2023-08-08 LAB — CBC WITH DIFFERENTIAL/PLATELET
Basophils Absolute: 0 10*3/uL (ref 0.0–0.1)
Basophils Relative: 0.6 % (ref 0.0–3.0)
Eosinophils Absolute: 0.2 10*3/uL (ref 0.0–0.7)
Eosinophils Relative: 4 % (ref 0.0–5.0)
HCT: 40 % (ref 36.0–46.0)
Hemoglobin: 13.4 g/dL (ref 12.0–15.0)
Lymphocytes Relative: 36.2 % (ref 12.0–46.0)
Lymphs Abs: 1.4 10*3/uL (ref 0.7–4.0)
MCHC: 33.6 g/dL (ref 30.0–36.0)
MCV: 90.3 fL (ref 78.0–100.0)
Monocytes Absolute: 0.4 10*3/uL (ref 0.1–1.0)
Monocytes Relative: 9.6 % (ref 3.0–12.0)
Neutro Abs: 1.9 10*3/uL (ref 1.4–7.7)
Neutrophils Relative %: 49.6 % (ref 43.0–77.0)
Platelets: 190 10*3/uL (ref 150.0–400.0)
RBC: 4.43 Mil/uL (ref 3.87–5.11)
RDW: 13.2 % (ref 11.5–15.5)
WBC: 3.9 10*3/uL — ABNORMAL LOW (ref 4.0–10.5)

## 2023-08-08 LAB — FERRITIN: Ferritin: 27.8 ng/mL (ref 10.0–291.0)

## 2023-08-08 LAB — SEDIMENTATION RATE: Sed Rate: 14 mm/h (ref 0–30)

## 2023-08-08 LAB — C-REACTIVE PROTEIN: CRP: <1 mg/dL (ref 0.5–20.0)

## 2023-08-08 LAB — VITAMIN D 25 HYDROXY (VIT D DEFICIENCY, FRACTURES): VITD: 30.07 ng/mL (ref 30.00–100.00)

## 2023-08-08 LAB — TSH: TSH: 3.36 u[IU]/mL (ref 0.35–5.50)

## 2023-08-08 LAB — URIC ACID: Uric Acid, Serum: 3.9 mg/dL (ref 2.4–7.0)

## 2023-08-08 LAB — VITAMIN B12: Vitamin B-12: 620 pg/mL (ref 211–911)

## 2023-08-08 NOTE — Patient Instructions (Addendum)
 Injection in knees today Exercises Labs See me again in 4-6 weeks

## 2023-08-08 NOTE — Assessment & Plan Note (Signed)
 Wanted to continue to monitor.  Still having some difficulty.  Has been dealing with it for years.  Had quickly some hypoechoic changes of the bicep tendon.  We discussed compression sleeve at this time.  Likely does have a labral pathology.  Would consider another injection if needed with it being nearly 1 year since the previous injection.

## 2023-08-08 NOTE — Assessment & Plan Note (Signed)
 Patient given injection and tolerated the procedure well.  Discussed icing regimen and home exercises.  Discussed which activities to do and which ones to avoid.  I do feel that laboratory workup is necessary with the cellulitis again and is close patient has significant difficulty healing from the partial tear of the rotator cuff.  Will follow-up with me again in 4 to 6 weeks worsening pain advanced imaging may be warranted

## 2023-08-09 LAB — PTH, INTACT AND CALCIUM
Calcium: 9.4 mg/dL (ref 8.6–10.4)
PTH: 54 pg/mL (ref 16–77)

## 2023-08-11 ENCOUNTER — Encounter: Payer: Self-pay | Admitting: Cardiology

## 2023-08-11 ENCOUNTER — Encounter: Payer: Self-pay | Admitting: Family Medicine

## 2023-08-11 LAB — RHEUMATOID FACTOR: Rheumatoid fact SerPl-aCnc: <10 [IU]/mL (ref <14)

## 2023-08-11 LAB — ANGIOTENSIN CONVERTING ENZYME: Angiotensin-Converting Enzyme: 22 U/L (ref 9–67)

## 2023-08-11 LAB — ANA: Anti Nuclear Antibody (ANA): NEGATIVE

## 2023-08-11 LAB — CALCIUM, IONIZED: Calcium, Ion: 5 mg/dL (ref 4.7–5.5)

## 2023-08-11 LAB — CYCLIC CITRUL PEPTIDE ANTIBODY, IGG: Cyclic Citrullin Peptide Ab: <16 U

## 2023-08-15 ENCOUNTER — Encounter: Payer: Self-pay | Admitting: Allergy

## 2023-08-15 ENCOUNTER — Other Ambulatory Visit: Payer: Self-pay

## 2023-08-15 ENCOUNTER — Ambulatory Visit: Payer: Federal, State, Local not specified - PPO | Admitting: Allergy

## 2023-08-15 VITALS — BP 122/76 | HR 72 | Temp 97.9°F | Resp 18 | Ht 67.25 in | Wt 146.5 lb

## 2023-08-15 DIAGNOSIS — J301 Allergic rhinitis due to pollen: Secondary | ICD-10-CM | POA: Diagnosis not present

## 2023-08-15 DIAGNOSIS — K219 Gastro-esophageal reflux disease without esophagitis: Secondary | ICD-10-CM

## 2023-08-15 DIAGNOSIS — B999 Unspecified infectious disease: Secondary | ICD-10-CM

## 2023-08-15 DIAGNOSIS — R0602 Shortness of breath: Secondary | ICD-10-CM

## 2023-08-15 DIAGNOSIS — K9049 Malabsorption due to intolerance, not elsewhere classified: Secondary | ICD-10-CM

## 2023-08-15 NOTE — Patient Instructions (Addendum)
SOB (shortness of breath) - Breathing test is normal - If not found any significant improvement with Symbicort or as needed albuterol -I believe you may have some component of inducible laryngeal obstruction (ILO, aka vocal cord dysfunction) that could be impacting your upper airway and your ability to get air in and out and may drive the shortness of breath you are feeling.  Discussed referring to ENT specialist that diagnose and treat ILO which includes speech therapy for breath training techniques.  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  It is not likely necessary take antihistamine the entire year and may be able to discontinue during the late summer through winter.  Gastroesophageal reflux disease - Continue with dietary changes.   Food intolerance -Concern for possible gluten intolerance versus allergy.  Will get set up for food allergy testing.  Hold antihistamines for 3 days prior to skin testing.  Schedule skin testing visit for food allergy testing

## 2023-08-15 NOTE — Progress Notes (Signed)
Follow-up Note  RE: Renee [name] MRN: [id number] DOB: 11-23-1960 Date of Office Visit: 08/15/2023   History of present illness: Renee [name] is a 63 y.o. female presenting today for follow-up of shortness of breath, allergic rhinitis with chronic rhinosinusitis and reflux.  She was last seen in the office on 02/13/2023 by Dr. Dellis Anes.  She denies having any major health changes, surgeries or hospitalizations since this visit. Discussed the use of AI scribe software for clinical note transcription with the patient, who gave verbal consent to proceed.  She has experienced persistent shortness of breath for many years despite extensive testing with Dr. Dellis Anes and a pulmonologist, including normal results from various tests, no definitive cause has been identified as to her shortness of breath she reports. She describes her breathing as potentially shallow and questions if she is not fully inhaling or exhaling. She has previously used Symbicort and albuterol inhalers but found no significant improvement in her symptoms and has since discontinued her use.  She reports a chronic nasal drainage issue for the past two years. She was previously diagnosed with a staph infection in the nasal area by an ENT specialist and was treated with an ointment and antibiotics, but the drainage persists. She plans to follow up with an ENT for further evaluation.  She has a history of allergy testing which revealed a grass allergy. She takes an allergy pill nightly, currently a [employer] brand of Xyzal, but questions its necessity outside of allergy season. She experiences significant allergy symptoms primarily in the spring.  She has a history of digestive issues spanning over thirty years and is curious about potential food allergies, particularly gluten. She previously tried a yeast-free diet, which improved her symptoms but resulted in significant weight loss.  That she did put it back in the diet and has  continued.    Review of systems: 10pt ROS negative unless noted above in HPI  All other systems negative unless noted above in HPI  Past medical/social/surgical/family history have been reviewed and are unchanged unless specifically indicated below.  No changes  Medication List: Current Outpatient Medications  Medication Sig Dispense Refill   albuterol (VENTOLIN HFA) 108 (90 Base) MCG/ACT inhaler Inhale 2 puffs into the lungs every 4 (four) hours as needed for wheezing or shortness of breath. 18 g 1   aspirin EC 81 MG tablet Take 1 tablet (81 mg total) by mouth daily. Swallow whole. 90 tablet 3   Cholecalciferol (VITAMIN D3) 50 MCG (2000 UT) capsule Take 2,000 Units by mouth daily.     cloNIDine (CATAPRES) 0.1 MG tablet Take 0.1 mg by mouth daily.     DIGESTIVE ENZYMES PO Take 1 tablet by mouth daily as needed.     famotidine (PEPCID) 40 MG tablet Take 1 tablet (40 mg total) by mouth daily. 90 tablet 3   fluticasone (FLONASE) 50 MCG/ACT nasal spray SPRAY 2 SPRAYS INTO EACH NOSTRIL EVERY DAY 48 mL 3   levocetirizine (XYZAL ALLERGY 24HR) 5 MG tablet Take 5 mg by mouth every evening.     magnesium oxide (MAG-OX) 400 (240 Mg) MG tablet Take 400 mg by mouth daily.     metroNIDAZOLE (METROCREAM) 0.75 % cream Apply 1 application topically 2 (two) times daily.     Omega-3 Fatty Acids (FISH OIL TRIPLE STRENGTH) 1400 MG CAPS Take by mouth daily as needed.     omeprazole (PRILOSEC) 20 MG capsule Take Omeprazole 20 mg by mouth daily for 4-6 weeks, and then as needed  thereafter. 90 capsule 1   Plant Sterols and Stanols (CHOLESTOFF PLUS PO) Take by mouth.     Probiotic Product (PROBIOTIC DAILY PO) Take 1 tablet by mouth daily.     rosuvastatin (CRESTOR) 40 MG tablet TAKE 1 TABLET BY MOUTH EVERY DAY 90 tablet 3   sertraline (ZOLOFT) 100 MG tablet Take 1 tablet (100 mg total) by mouth daily. (Patient taking differently: Take 50 mg by mouth daily.) 90 tablet 3   SYMBICORT 80-4.5 MCG/ACT inhaler Inhale 2  puffs into the lungs 2 (two) times daily as needed. 10.2 g 2   vitamin E 180 MG (400 UNITS) capsule Take 400 Units by mouth daily.     No current facility-administered medications for this visit.     Known medication allergies: Allergies  Allergen Reactions   Codeine Other (See Comments)    Hot flashes/passed out   Pollen Extract Itching and Cough    drainage   Anesthetics, Amide      Physical examination: Blood pressure 122/76, pulse 72, temperature 97.9 F (36.6 C), temperature source Temporal, resp. rate 18, height 5' 7.25" (1.708 m), weight 146 lb 8 oz (66.5 kg), SpO2 96%.  General: Alert, interactive, in no acute distress. HEENT: PERRLA, TMs pearly gray, turbinates non-edematous without discharge, post-pharynx non erythematous. Neck: Supple without lymphadenopathy. Lungs: Clear to auscultation without wheezing, rhonchi or rales. {no increased work of breathing. CV: Normal S1, S2 without murmurs. Abdomen: Nondistended, nontender. Skin: Warm and dry, without lesions or rashes. Extremities:  No clubbing, cyanosis or edema. Neuro:   Grossly intact.  Diagnositics/Labs:  Spirometry: FEV1: 2.56L 98%, FVC: 3.75L 113%, ratio consistent with nonobstructive pattern  Assessment and plan:   SOB (shortness of breath) - Breathing test is normal - If not found any significant improvement with Symbicort or as needed albuterol -I believe you may have some component of inducible laryngeal obstruction (ILO, aka vocal cord dysfunction) that could be impacting your upper airway and your ability to get air in and out and may drive the shortness of breath you are feeling.  Discussed referring to ENT specialist that diagnose and treat ILO which includes speech therapy for breath training techniques.  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  It is not likely necessary take antihistamine the entire year and may be able to discontinue during the late summer through winter.  Gastroesophageal reflux disease - Continue with dietary changes.   Food intolerance -Concern for possible gluten intolerance versus allergy.  Will get set up for food allergy testing.  Hold antihistamines for 3 days prior to skin testing.  Schedule skin testing visit for food allergy testing  I appreciate the opportunity to take part in Harlei's care. Please do not hesitate to contact me with questions.  Sincerely,   Margo Aye, MD Allergy/Immunology [facility]

## 2023-08-18 ENCOUNTER — Telehealth: Payer: Self-pay

## 2023-08-18 NOTE — Telephone Encounter (Signed)
Renee Bruins, MD  [facility] Can you tell me if she received pneumovax from [place] at her last visit with Dr gallagher around 02/13/23?    Lm for pt to call us back about this but I also found this in the patients chart under immunizations

## 2023-08-19 DIAGNOSIS — N951 Menopausal and female climacteric states: Secondary | ICD-10-CM | POA: Diagnosis not present

## 2023-08-19 DIAGNOSIS — F909 Attention-deficit hyperactivity disorder, unspecified type: Secondary | ICD-10-CM | POA: Diagnosis not present

## 2023-08-19 DIAGNOSIS — Z818 Family history of other mental and behavioral disorders: Secondary | ICD-10-CM | POA: Diagnosis not present

## 2023-08-21 ENCOUNTER — Other Ambulatory Visit: Payer: Self-pay | Admitting: *Deleted

## 2023-08-21 DIAGNOSIS — R911 Solitary pulmonary nodule: Secondary | ICD-10-CM

## 2023-08-27 ENCOUNTER — Ambulatory Visit: Payer: Federal, State, Local not specified - PPO | Admitting: Allergy

## 2023-08-27 ENCOUNTER — Encounter: Payer: Self-pay | Admitting: Allergy

## 2023-08-27 DIAGNOSIS — T781XXD Other adverse food reactions, not elsewhere classified, subsequent encounter: Secondary | ICD-10-CM | POA: Diagnosis not present

## 2023-08-27 DIAGNOSIS — K9049 Malabsorption due to intolerance, not elsewhere classified: Secondary | ICD-10-CM

## 2023-08-27 MED ORDER — EPINEPHRINE 0.3 MG/0.3ML IJ SOAJ: 0.3000 mg | INTRAMUSCULAR | 0 refills | Status: AC | PRN

## 2023-08-27 NOTE — Patient Instructions (Addendum)
 Food intolerance -Food allergy testing does have allergy signals for milk, rice, chicken and chocolate -Recommend avoiding these foods in diet that you note cause symptoms after eating.  If the food does not cause any issues then keep in diet and is considered a sensitivity.  -Will prescribe epinephrine device for you to have access to in case of allergic reaction.  Follow emergency action plan in case of reaction.  -We have discussed the following in regards to foods:   Allergy: food allergy is when you have eaten a food, developed an allergic reaction after eating the food and have IgE to the food (positive food testing either by skin testing or blood testing).  Food allergy could lead to life threatening symptoms  Sensitivity: occurs when you have IgE to a food (positive food testing either by skin testing or blood testing) but is a food you eat without any issues.  This is not an allergy and we recommend keeping the food in the diet  Intolerance: this is when you have negative testing by either skin testing or blood testing thus not allergic but the food causes symptoms (like belly pain, bloating, diarrhea etc) with ingestion.  These foods should be avoided to prevent symptoms.    SOB (shortness of breath) - Breathing test was normal at last visit - has not found any significant improvement with Symbicort or as needed albuterol - I believe you may have some component of inducible laryngeal obstruction (ILO, aka vocal cord dysfunction) that could be impacting your upper airway and your ability to get air in and out and may drive the shortness of breath you are feeling.  Referral has been placed to ENT specialist that diagnose and treat ILO which includes speech therapy for breath training techniques.  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  It is not likely necessary take antihistamine the entire year and may be able to discontinue during the late summer through winter.  Gastroesophageal reflux disease - Continue with dietary changes.   Follow-up in 6-12 months or sooner if needed

## 2023-08-27 NOTE — Progress Notes (Signed)
 Follow-up Note  RE: Renee [name] MRN: [id number] DOB: 1960/09/29 Date of Office Visit: 08/27/2023   History of present illness: Renee [name] is a 63 y.o. female presenting today for skin testing visit.  She was last in the office on 08/15/2023 by myself for history of shortness of breath, allergic rhinitis, reflux and food intolerance.  She has held antihistamines for testing today.  She is in her usual state of health today.  Medication List: Current Outpatient Medications  Medication Sig Dispense Refill   albuterol (VENTOLIN HFA) 108 (90 Base) MCG/ACT inhaler Inhale 2 puffs into the lungs every 4 (four) hours as needed for wheezing or shortness of breath. 18 g 1   aspirin EC 81 MG tablet Take 1 tablet (81 mg total) by mouth daily. Swallow whole. 90 tablet 3   Cholecalciferol (VITAMIN D3) 50 MCG (2000 UT) capsule Take 2,000 Units by mouth daily.     cloNIDine (CATAPRES) 0.1 MG tablet Take 0.1 mg by mouth daily.     DIGESTIVE ENZYMES PO Take 1 tablet by mouth daily as needed.     famotidine (PEPCID) 40 MG tablet Take 1 tablet (40 mg total) by mouth daily. 90 tablet 3   fluticasone (FLONASE) 50 MCG/ACT nasal spray SPRAY 2 SPRAYS INTO EACH NOSTRIL EVERY DAY 48 mL 3   levocetirizine (XYZAL ALLERGY 24HR) 5 MG tablet Take 5 mg by mouth every evening.     magnesium oxide (MAG-OX) 400 (240 Mg) MG tablet Take 400 mg by mouth daily.     metroNIDAZOLE (METROCREAM) 0.75 % cream Apply 1 application topically 2 (two) times daily.     Omega-3 Fatty Acids (FISH OIL TRIPLE STRENGTH) 1400 MG CAPS Take by mouth daily as needed.     omeprazole (PRILOSEC) 20 MG capsule Take Omeprazole 20 mg by mouth daily for 4-6 weeks, and then as needed thereafter. 90 capsule 1   Plant Sterols and Stanols (CHOLESTOFF PLUS PO) Take by mouth.     Probiotic Product (PROBIOTIC DAILY PO) Take 1 tablet by mouth daily.     rosuvastatin (CRESTOR) 40 MG tablet TAKE 1 TABLET BY MOUTH EVERY DAY 90 tablet 3   sertraline (ZOLOFT)  100 MG tablet Take 1 tablet (100 mg total) by mouth daily. (Patient taking differently: Take 50 mg by mouth daily.) 90 tablet 3   SYMBICORT 80-4.5 MCG/ACT inhaler Inhale 2 puffs into the lungs 2 (two) times daily as needed. 10.2 g 2   vitamin E 180 MG (400 UNITS) capsule Take 400 Units by mouth daily.     No current facility-administered medications for this visit.     Known medication allergies: Allergies  Allergen Reactions   Codeine Other (See Comments)    Hot flashes/passed out   Pollen Extract Itching and Cough    drainage   Anesthetics, Amide    Diagnositics/Labs:  Allergy testing:   Food Adult Perc - 08/27/23 1300     Time Antigen Placed 1354    Allergen Manufacturer Waynette Buttery    Location Back    Number of allergen test 27     Control-buffer 50% Glycerol Negative    Control-Histamine 2+    1. Peanut Negative    3. Wheat Negative    5. Milk, Cow 2+    6. Casein Negative    7. Egg White, Chicken Negative    12. Almond Negative    14. Pecan Food Negative    15. Pistachio Negative    23. Shrimp Negative  24. Crab Negative    28. Oat  Negative    29. Rice 2+    30. Barley Negative    31. Rye  Negative    34. Chicken Meat 2+    35. Pork Negative    36. Beef Negative    38. Tomato Negative    39. White Potato Negative    40. Sweet Potato Negative    45. Green Pepper Negative    46. Mushrooms Negative    47. Onion Negative    57. Banana Negative    66. Chocolate/Cacao Bean 2+             Allergy testing results were read and interpreted by provider, documented by clinical staff.   Assessment and plan:   Adverse food reaction -Food allergy testing does have allergy signals for milk, rice, chicken and chocolate -Recommend avoiding these foods in diet that you note cause symptoms after eating.  If the food does not cause any issues then keep in diet and is considered a sensitivity.  -Will prescribe epinephrine device for you to have access to in case of  allergic reaction.  Follow emergency action plan in case of reaction.  -We have discussed the following in regards to foods:   Allergy: food allergy is when you have eaten a food, developed an allergic reaction after eating the food and have IgE to the food (positive food testing either by skin testing or blood testing).  Food allergy could lead to life threatening symptoms  Sensitivity: occurs when you have IgE to a food (positive food testing either by skin testing or blood testing) but is a food you eat without any issues.  This is not an allergy and we recommend keeping the food in the diet  Intolerance: this is when you have negative testing by either skin testing or blood testing thus not allergic but the food causes symptoms (like belly pain, bloating, diarrhea etc) with ingestion.  These foods should be avoided to prevent symptoms.    SOB (shortness of breath) - Breathing test was normal at last visit - has not found any significant improvement with Symbicort or as needed albuterol - I believe you may have some component of inducible laryngeal obstruction (ILO, aka vocal cord dysfunction) that could be impacting your upper airway and your ability to get air in and out and may drive the shortness of breath you are feeling.  Referral has been placed to ENT specialist that diagnose and treat ILO which includes speech therapy for breath training techniques.  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  It is not likely necessary take antihistamine the entire year and may be able to discontinue during the late summer through winter.  Gastroesophageal reflux  disease - Continue with dietary changes.   Follow-up in 6-12 months or sooner if needed  I appreciate the opportunity to take part in Lazette's care. Please do not hesitate to contact me with questions.  Sincerely,   Margo Aye, MD Allergy/Immunology [facility]

## 2023-09-02 ENCOUNTER — Encounter: Payer: Self-pay | Admitting: Internal Medicine

## 2023-09-02 MED ORDER — SERTRALINE HCL 25 MG PO TABS
25.0000 mg | ORAL_TABLET | Freq: Every day | ORAL | 1 refills | Status: DC
Start: 2023-09-02 — End: 2024-05-11

## 2023-09-03 DIAGNOSIS — M170 Bilateral primary osteoarthritis of knee: Secondary | ICD-10-CM | POA: Insufficient documentation

## 2023-09-03 NOTE — Assessment & Plan Note (Signed)
 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  Knee injections given.  Hopeful to be significantly improved.

## 2023-09-04 NOTE — Progress Notes (Signed)
 [name] [address] [place] [address] Phone: [phone] Subjective:   Renee [name], am serving as a scribe for Dr. Antoine Primas.  I'm seeing this patient by the request  of:  Myrlene Broker, MD  CC: Left knee pain follow-up  [id number]  08/08/2023 Bilateral knees do have some mild arthritis but does have the worsening synovitis on the other knee.  Knee injections given.  Hopeful to be significantly improved.     Wanted to continue to monitor. Still having some difficulty. Has been dealing with it for years. Had quickly some hypoechoic changes of the bicep tendon. We discussed compression sleeve at this time. Likely does have a labral pathology. Would consider another injection if needed with it being nearly 1 year since the previous injection.   Patient given injection and tolerated the procedure well.  Discussed icing regimen and home exercises.  Discussed which activities to do and which ones to avoid.  I do feel that laboratory workup is necessary with the cellulitis again and is close patient has significant difficulty healing from the partial tear of the rotator cuff.  Will follow-up with me again in 4 to 6 weeks worsening pain advanced imaging may be warranted      Update 09/05/2023 Renee [name] is a 63 y.o. female coming in with complaint of L knee pain. Patient states that pain in L knee will wake her up at night. Knee feels swollen and tighter. Injection helped L knee for 5-6 days.        Past Medical History:  Diagnosis Date   Abnormal chest x-ray    CXR c/w COPD. Full PFTs '11 - normal   Allergy    seasonal   Anxiety    Arthritis    both knees, post traumatic - neck   Asthma    Cancer (HCC)    Cataract    removed bilateral   COVID    Depression    with anxiety.   Dyslipidemia    GERD (gastroesophageal reflux disease)    Hx of colonic polyps 2008   Hyperlipidemia    IBS (irritable bowel syndrome)     Infertility, female    failed 3 attempts at in vitro fertilization   Migraines    during teenage years   MVP (mitral valve prolapse)    last 2D echo approx '10   Neuromuscular disorder (HCC)    Osteopenia    DEXA 03/2012: -1.0   Sleep apnea    Past Surgical History:  Procedure Laterality Date   APPENDECTOMY     BUNIONECTOMY WITH HAMMERTOE RECONSTRUCTION Left 07/06/13   [facility]   CARDIAC CATHETERIZATION  2006   normal study   CARPAL TUNNEL RELEASE  2011   right hand   HYSTEROPLASTY REPAIR OF UTERINE ANOMALY     2003 - laproscopically; 2006 - laparotomy   LAPAROSCOPIC ENDOMETRIOSIS FULGURATION     1996 and 2002   MANDIBLE RECONSTRUCTION     NASAL SEPTUM SURGERY  11/2012   [facility] FINGER RELEASE  2011   R thimb and ring finger   TRIGGER FINGER RELEASE  02/04/2012   [place], in office   Social History   Socioeconomic History   Marital status: Married    Spouse name: Not on file   Number of children: 0   Years of education: 16   Highest education level: Bachelor's degree (e.g., BA, AB, BS)  Occupational History   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   Married '94.   Denies any physical or sexual abuse.   Family in [place]: parents and sister.   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   Has smoke alarms, wears seatbelt at all times, uses helmut when appropriate, firearms present in the home, uses herbal remedies, caffeine- 2-3 per day.   No regular exercise.   Social Drivers of Corporate investment banker Strain: Low Risk  (02/18/2023)   Overall  Financial Resource Strain (CARDIA)    Difficulty of Paying Living Expenses: Not hard at all  Food Insecurity: No Food Insecurity (02/18/2023)   Hunger Vital Sign    Worried About Running Out of Food in the Last Year: Never true    Ran Out of Food in the Last Year: Never true  Transportation Needs: No Transportation Needs (02/18/2023)   PRAPARE - Administrator, Civil Service (Medical): No    Lack of Transportation (Non-Medical): No  Physical Activity: Insufficiently Active (02/18/2023)   Exercise Vital Sign    Days of Exercise per Week: 2 days    Minutes of Exercise per Session: 60 min  Stress: No Stress Concern Present (02/18/2023)   [employer] of Occupational Health - Occupational Stress Questionnaire    Feeling of Stress : Only a little  Social Connections: Moderately Integrated (02/18/2023)   Social Connection and Isolation Panel [NHANES]    Frequency of Communication with Friends and Family: More than three times a week    Frequency of Social Gatherings with Friends and Family: Once a week    Attends Religious Services: More than 4 times per year    Active Member of [employer] or Organizations: No    Attends Engineer, structural: Not on file    Marital Status: Married   Allergies  Allergen Reactions   Codeine Other (See Comments)    Hot flashes/passed out   Pollen Extract Itching and Cough    drainage   Anesthetics, Amide    Family History  Problem Relation Age of Onset   Allergic rhinitis Mother    Arthritis Mother        DOB 45   Cancer Mother        Thyroid cancer   Hypertension Mother    Atrial fibrillation Mother    Melanoma Mother    Breast cancer Mother    Arthritis Father        DOB 1929   Melanoma Father    Allergic rhinitis Sister    Colon polyps Neg Hx    Rectal cancer Neg Hx    Stomach cancer Neg Hx    Colon cancer Neg Hx      Current Outpatient Medications (Cardiovascular):    cloNIDine (CATAPRES) 0.1 MG tablet, Take 0.1 mg  by mouth daily.   EPINEPHrine 0.3 mg/0.3 mL IJ SOAJ injection, Inject 0.3 mg into the muscle as needed for anaphylaxis.   rosuvastatin (CRESTOR) 40 MG tablet, TAKE 1 TABLET BY MOUTH EVERY DAY  Current Outpatient Medications (Respiratory):    albuterol (VENTOLIN HFA) 108 (90 Base) MCG/ACT inhaler, Inhale 2 puffs into the lungs every 4 (four) hours as needed for wheezing or shortness of breath.   fluticasone (FLONASE) 50 MCG/ACT nasal spray, SPRAY 2 SPRAYS INTO EACH NOSTRIL EVERY DAY   levocetirizine (XYZAL ALLERGY 24HR) 5 MG tablet, Take 5 mg by mouth every evening.   SYMBICORT 80-4.5 MCG/ACT inhaler, Inhale 2 puffs into the lungs 2 (two) times daily as needed.  Current Outpatient Medications (Analgesics):    aspirin EC 81 MG tablet, Take 1 tablet (81 mg total) by mouth daily. Swallow whole.   meloxicam (MOBIC) 15 MG tablet, Take 1 tablet (15 mg total) by mouth daily.   Current Outpatient Medications (Other):    Cholecalciferol (VITAMIN D3) 50 MCG (2000 UT) capsule, Take 2,000 Units by mouth daily.   DIGESTIVE ENZYMES PO, Take 1 tablet by mouth daily as needed.   famotidine (PEPCID) 40 MG tablet, Take 1 tablet (40 mg total) by mouth daily.   gabapentin (NEURONTIN) 100 MG capsule, Take 2 capsules (200 mg total) by mouth at bedtime.   magnesium oxide (MAG-OX) 400 (240 Mg) MG tablet, Take 400 mg by mouth daily.   metroNIDAZOLE (METROCREAM) 0.75 % cream, Apply 1 application topically 2 (two) times daily.   Omega-3 Fatty Acids (FISH OIL TRIPLE STRENGTH) 1400 MG CAPS, Take by mouth daily as needed.   omeprazole (PRILOSEC) 20 MG capsule, Take Omeprazole 20 mg by mouth daily for 4-6 weeks, and then as needed thereafter.   Plant Sterols and Stanols (CHOLESTOFF PLUS PO), Take by mouth.   Probiotic Product (PROBIOTIC DAILY PO), Take 1 tablet by mouth daily.   sertraline (ZOLOFT) 25 MG tablet, Take 1 tablet (25 mg total) by mouth daily.   vitamin E 180 MG (400 UNITS) capsule, Take 400 Units by mouth  daily.   Reviewed prior external information including notes and imaging from  primary care provider As well as notes that were available from care everywhere and other healthcare systems.  Past medical history, social, surgical and family history all reviewed in electronic medical record.  No pertanent information unless stated regarding to the chief complaint.   Review of Systems:  No headache, visual changes, nausea, vomiting, diarrhea, constipation, dizziness, abdominal pain, skin rash, fevers, chills, night sweats, weight loss, swollen lymph nodes, body aches, chest pain, shortness of breath, mood changes. POSITIVE muscle aches, joint swelling  Objective  Blood pressure 120/78, pulse 72, height 5\' 7"  (1.702 m), weight 147 lb (66.7 kg), SpO2 97%.   General: No apparent distress alert and oriented x3 mood and affect normal, dressed appropriately.  HEENT: Pupils equal, extraocular movements intact  Respiratory: Patient's speak in full sentences and does not appear short of breath  Cardiovascular: No lower extremity edema, non tender, no erythema  Very mild antalgic gait noted.  Patient does have some tenderness to palpation over the patellofemoral area on the left side.    Impression and Recommendations:    The above documentation has been reviewed and is accurate and complete Judi Saa, DO

## 2023-09-05 ENCOUNTER — Encounter: Payer: Self-pay | Admitting: Family Medicine

## 2023-09-05 ENCOUNTER — Ambulatory Visit: Payer: Federal, State, Local not specified - PPO | Admitting: Family Medicine

## 2023-09-05 ENCOUNTER — Ambulatory Visit

## 2023-09-05 ENCOUNTER — Telehealth: Payer: Self-pay | Admitting: Allergy

## 2023-09-05 VITALS — BP 120/78 | HR 72 | Ht 67.0 in | Wt 147.0 lb

## 2023-09-05 DIAGNOSIS — M170 Bilateral primary osteoarthritis of knee: Secondary | ICD-10-CM | POA: Diagnosis not present

## 2023-09-05 DIAGNOSIS — M25562 Pain in left knee: Secondary | ICD-10-CM

## 2023-09-05 DIAGNOSIS — M25462 Effusion, left knee: Secondary | ICD-10-CM | POA: Diagnosis not present

## 2023-09-05 MED ORDER — MELOXICAM 15 MG PO TABS: 15.0000 mg | ORAL_TABLET | Freq: Every day | ORAL | 0 refills | Status: DC

## 2023-09-05 MED ORDER — GABAPENTIN 100 MG PO CAPS: 200.0000 mg | ORAL_CAPSULE | Freq: Every day | ORAL | 0 refills | Status: DC

## 2023-09-05 NOTE — Assessment & Plan Note (Addendum)
 Known arthritic changes.  Will get repeat x-ray to further evaluate any type of severity and the progression.  I do still think that there was more of a synovitis.  Patient's laboratory workup ago was unremarkable.  Discussed with patient that early to repeat any type of injection at the moment.  Discussed with patient about the getting the x-ray.  Continue to stay active.  Meloxicam and gabapentin given.  Will see how patient responds to these medications and hopefully patient will start to improve.  Follow-up again in 6 to 8 weeks.

## 2023-09-05 NOTE — Telephone Encounter (Signed)
 Devaney has been referred to:  Dr. Rubye Oaks [address], [place] [address] [phone]  I have faxed the referral and all corresponding notes.  They will reach out to the patient to schedule.  Patient has been notified via MyChart.

## 2023-09-05 NOTE — Patient Instructions (Signed)
 Meloxicam 15 take in 5 day burst  Gabapentin  200 mg at night  Ice after activity  Compression sleeve when doing activity  Xray's on the way out 2 month follow up

## 2023-09-24 ENCOUNTER — Encounter: Payer: Self-pay | Admitting: Family Medicine

## 2023-09-25 DIAGNOSIS — Z85828 Personal history of other malignant neoplasm of skin: Secondary | ICD-10-CM | POA: Diagnosis not present

## 2023-09-25 DIAGNOSIS — L718 Other rosacea: Secondary | ICD-10-CM | POA: Diagnosis not present

## 2023-09-25 DIAGNOSIS — D2271 Melanocytic nevi of right lower limb, including hip: Secondary | ICD-10-CM | POA: Diagnosis not present

## 2023-09-25 DIAGNOSIS — I8392 Asymptomatic varicose veins of left lower extremity: Secondary | ICD-10-CM | POA: Diagnosis not present

## 2023-10-09 ENCOUNTER — Other Ambulatory Visit: Payer: Self-pay | Admitting: Family Medicine

## 2023-11-03 NOTE — Progress Notes (Unsigned)
 [facility] [address] [place] [address] Phone: [phone] Subjective:   Renee [name], am serving as a scribe for Dr. Ronnell Coins.  I'm seeing this patient by the request  of:  Adelia Homestead, MD  CC: Knee pain follow-up  [id number]  09/05/2023 Known arthritic changes.  Will get repeat x-ray to further evaluate any type of severity and the progression.  I do still think that there was more of a synovitis.  Patient's laboratory workup ago was unremarkable.  Discussed with patient that early to repeat any type of injection at the moment.  Discussed with patient about the getting the x-ray.  Continue to stay active.  Meloxicam  and gabapentin  given.  Will see how patient responds to these medications and hopefully patient will start to improve.  Follow-up again in 6 to 8 weeks.      Update 11/05/2023 Renee [name] is a 63 y.o. female coming in with complaint of B knee pain. Patient states that she was going to call to get an earlier appointment as her knee pain is increasing. L knee is swollen and hinders her from getting up and down from the floor. Knee is tight in back. R knee is starting to bother her more due to compensation. Turmeric has been helpful.   Xray L knee 09/05/2023 IMPRESSION: Minimal patellofemoral spurring. Small to moderate knee effusion.     Past Medical History:  Diagnosis Date   Abnormal chest x-ray    CXR c/w COPD. Full PFTs '11 - normal   Allergy     seasonal   Anxiety    Arthritis    both knees, post traumatic - neck   Asthma    Cancer (HCC)    Cataract    removed bilateral   COVID    Depression    with anxiety.   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 attempts at in vitro fertilization   Migraines    during teenage years   MVP (mitral valve prolapse)    last 2D echo  approx '10   Neuromuscular disorder (HCC)    Osteopenia    DEXA 03/2012: -1.0   Sleep apnea    Past Surgical History:  Procedure Laterality Date   APPENDECTOMY     BUNIONECTOMY WITH HAMMERTOE RECONSTRUCTION Left 07/06/13   [facility]   CARDIAC CATHETERIZATION  2006   normal study   CARPAL TUNNEL RELEASE  2011   right hand   HYSTEROPLASTY REPAIR OF UTERINE ANOMALY     2003 - laproscopically; 2006 - laparotomy   LAPAROSCOPIC ENDOMETRIOSIS FULGURATION     1996 and 2002   MANDIBLE RECONSTRUCTION     NASAL SEPTUM SURGERY  11/2012   [facility] FINGER RELEASE  2011   R thimb and ring finger   TRIGGER FINGER RELEASE  02/04/2012   [place], in office   Social History   Socioeconomic History   Marital status: Married    Spouse name: Not on file   Number of children: 0   Years of education: 16   Highest education level: Bachelor's degree (e.g., BA, AB, BS)  Occupational History   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   Married '94.   Denies any physical or sexual abuse.   Family in [place]: parents and sister.   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   Has smoke alarms, wears seatbelt at all times, uses helmut when appropriate, firearms present in the home, uses herbal remedies, caffeine- 2-3 per day.   No regular exercise.   Social Drivers of Corporate investment banker Strain: Low Risk  (02/18/2023)   Overall Financial Resource Strain (CARDIA)    Difficulty of Paying Living Expenses: Not hard at all  Food Insecurity: No Food Insecurity (02/18/2023)   Hunger  Vital Sign    Worried About Running Out of Food in the Last Year: Never true    Ran Out of Food in the Last Year: Never true  Transportation Needs: No Transportation Needs (02/18/2023)   PRAPARE - Administrator, Civil Service (Medical): No    Lack of Transportation (Non-Medical): No  Physical Activity: Insufficiently Active (02/18/2023)   Exercise Vital Sign    Days of Exercise per Week: 2 days    Minutes of Exercise per Session: 60 min  Stress: No Stress Concern Present (02/18/2023)   [employer] of Occupational Health - Occupational Stress Questionnaire    Feeling of Stress : Only a little  Social Connections: Moderately Integrated (02/18/2023)   Social Connection and Isolation Panel [NHANES]    Frequency of Communication with Friends and Family: More than three times a week    Frequency of Social Gatherings with Friends and Family: Once a week    Attends Religious Services: More than 4 times per year    Active Member of [employer] or Organizations: No    Attends Engineer, structural: Not on file    Marital Status: Married   Allergies  Allergen Reactions   Codeine Other (See Comments)    Hot flashes/passed out   Pollen Extract Itching and Cough    drainage   Anesthetics, Amide    Family History  Problem Relation Age of Onset   Allergic rhinitis Mother    Arthritis Mother        DOB 37   Cancer Mother        Thyroid  cancer   Hypertension Mother    Atrial fibrillation Mother    Melanoma Mother    Breast cancer Mother    Arthritis Father        DOB 1929   Melanoma Father    Allergic rhinitis Sister    Colon polyps Neg Hx    Rectal cancer Neg Hx    Stomach cancer Neg Hx    Colon cancer Neg Hx      Current Outpatient Medications (Cardiovascular):    cloNIDine (CATAPRES) 0.1 MG tablet, Take 0.1 mg by mouth daily.   EPINEPHrine  0.3 mg/0.3 mL IJ SOAJ injection, Inject 0.3 mg into the muscle as needed for anaphylaxis.   rosuvastatin  (CRESTOR ) 40 MG  tablet, TAKE 1 TABLET BY MOUTH EVERY DAY  Current Outpatient Medications (Respiratory):    albuterol  (VENTOLIN  HFA) 108 (90 Base) MCG/ACT inhaler, Inhale 2 puffs into the lungs every 4 (four) hours  as needed for wheezing or shortness of breath.   fluticasone  (FLONASE ) 50 MCG/ACT nasal spray, SPRAY 2 SPRAYS INTO EACH NOSTRIL EVERY DAY   levocetirizine (XYZAL ALLERGY  24HR) 5 MG tablet, Take 5 mg by mouth every evening.   SYMBICORT  80-4.5 MCG/ACT inhaler, Inhale 2 puffs into the lungs 2 (two) times daily as needed.  Current Outpatient Medications (Analgesics):    aspirin  EC 81 MG tablet, Take 1 tablet (81 mg total) by mouth daily. Swallow whole.   Current Outpatient Medications (Other):    Cholecalciferol (VITAMIN D3) 50 MCG (2000 UT) capsule, Take 2,000 Units by mouth daily.   DIGESTIVE ENZYMES PO, Take 1 tablet by mouth daily as needed.   famotidine  (PEPCID ) 40 MG tablet, Take 1 tablet (40 mg total) by mouth daily.   magnesium oxide (MAG-OX) 400 (240 Mg) MG tablet, Take 400 mg by mouth daily.   metroNIDAZOLE (METROCREAM) 0.75 % cream, Apply 1 application topically 2 (two) times daily.   Omega-3 Fatty Acids (FISH OIL TRIPLE STRENGTH) 1400 MG CAPS, Take by mouth daily as needed.   omeprazole  (PRILOSEC) 20 MG capsule, Take Omeprazole  20 mg by mouth daily for 4-6 weeks, and then as needed thereafter.   Plant Sterols and Stanols (CHOLESTOFF PLUS PO), Take by mouth.   Probiotic Product (PROBIOTIC DAILY PO), Take 1 tablet by mouth daily.   sertraline  (ZOLOFT ) 25 MG tablet, Take 1 tablet (25 mg total) by mouth daily.   vitamin E 180 MG (400 UNITS) capsule, Take 400 Units by mouth daily.   Reviewed prior external information including notes and imaging from  primary care provider As well as notes that were available from care everywhere and other healthcare systems.  Past medical history, social, surgical and family history all reviewed in electronic medical record.  No pertanent information  unless stated regarding to the chief complaint.   Review of Systems:  No headache, visual changes, nausea, vomiting, diarrhea, constipation, dizziness, abdominal pain, skin rash, fevers, chills, night sweats, weight loss, swollen lymph nodes, body aches, joint swelling, chest pain, shortness of breath, mood changes. POSITIVE muscle aches  Objective  Blood pressure 118/82, pulse 71, height 5\' 7"  (1.702 m), weight 143 lb (64.9 kg), SpO2 96%.   General: No apparent distress alert and oriented x3 mood and affect normal, dressed appropriately.  HEENT: Pupils equal, extraocular movements intact  Respiratory: Patient's speak in full sentences and does not appear short of breath  Cardiovascular: No lower extremity edema, non tender, no erythema  Antalgic gait noted.  Does have some crepitus noted.  Fairly significant crepitus.  Lacks last 15 degrees of flexion.  Positive McMurray's instability noted.     Impression and Recommendations:     The above documentation has been reviewed and is accurate and complete Myreon Wimer M Sully Manzi, DO

## 2023-11-05 ENCOUNTER — Ambulatory Visit: Admitting: Family Medicine

## 2023-11-05 VITALS — BP 118/82 | HR 71 | Ht 67.0 in | Wt 143.0 lb

## 2023-11-05 DIAGNOSIS — R41840 Attention and concentration deficit: Secondary | ICD-10-CM | POA: Diagnosis not present

## 2023-11-05 DIAGNOSIS — M170 Bilateral primary osteoarthritis of knee: Secondary | ICD-10-CM

## 2023-11-05 DIAGNOSIS — Z79899 Other long term (current) drug therapy: Secondary | ICD-10-CM | POA: Diagnosis not present

## 2023-11-05 DIAGNOSIS — M25562 Pain in left knee: Secondary | ICD-10-CM | POA: Diagnosis not present

## 2023-11-05 NOTE — Patient Instructions (Addendum)
MRI L knee [phone] We will be in touch

## 2023-11-05 NOTE — Assessment & Plan Note (Signed)
 Known arthritic changes but has had the significant synovitis of the left knee.  I do believe at this time that further evaluation with an MRI would be helpful.  Need to see if patient would be a candidate for arthroscopic procedure versus the need for any type of surgical intervention.  Most recent x-rays showed some mild arthritic changes only on exam.

## 2023-11-06 DIAGNOSIS — R41840 Attention and concentration deficit: Secondary | ICD-10-CM | POA: Diagnosis not present

## 2023-11-06 DIAGNOSIS — Z79899 Other long term (current) drug therapy: Secondary | ICD-10-CM | POA: Diagnosis not present

## 2023-11-13 ENCOUNTER — Ambulatory Visit
Admission: RE | Admit: 2023-11-13 | Discharge: 2023-11-13 | Disposition: A | Discharge status: Home / Self Care | Source: Ambulatory Visit | Attending: Family Medicine | Admitting: Family Medicine

## 2023-11-13 ENCOUNTER — Ambulatory Visit: Payer: Self-pay | Admitting: Family Medicine

## 2023-11-13 DIAGNOSIS — M1712 Unilateral primary osteoarthritis, left knee: Secondary | ICD-10-CM | POA: Diagnosis not present

## 2023-11-13 DIAGNOSIS — M25562 Pain in left knee: Secondary | ICD-10-CM | POA: Diagnosis not present

## 2023-11-13 DIAGNOSIS — M23322 Other meniscus derangements, posterior horn of medial meniscus, left knee: Secondary | ICD-10-CM | POA: Diagnosis not present

## 2023-11-13 DIAGNOSIS — M25462 Effusion, left knee: Secondary | ICD-10-CM | POA: Diagnosis not present

## 2023-11-17 ENCOUNTER — Encounter: Payer: Self-pay | Admitting: Internal Medicine

## 2023-11-25 ENCOUNTER — Other Ambulatory Visit: Payer: Self-pay

## 2023-11-25 DIAGNOSIS — M170 Bilateral primary osteoarthritis of knee: Secondary | ICD-10-CM

## 2023-12-05 DIAGNOSIS — M25562 Pain in left knee: Secondary | ICD-10-CM | POA: Diagnosis not present

## 2023-12-18 ENCOUNTER — Encounter: Payer: Self-pay | Admitting: Cardiology

## 2023-12-26 DIAGNOSIS — H93293 Other abnormal auditory perceptions, bilateral: Secondary | ICD-10-CM | POA: Diagnosis not present

## 2023-12-29 NOTE — Progress Notes (Unsigned)
 [name] [address] [place] [address] Phone: [phone] Subjective:   Renee [name], am serving as a scribe for Dr. Arthea Claudene.  I'm seeing this patient by the request  of:  Rollene Almarie LABOR, MD  CC: Right shoulder pain  [name]  11/05/2023 Known arthritic changes but has had the significant synovitis of the left knee.  I do believe at this time that further evaluation with an MRI would be helpful.  Need to see if patient would be a candidate for arthroscopic procedure versus the need for any type of surgical intervention.  Most recent x-rays showed some mild arthritic changes only on exam.       Updated 12/31/2023 Renee [name] is a 63 y.o. female coming in with complaint of R shoulder pain. 4 months of pain has gotten worse over last 6 weeks. Nothing is helping with the pain. Reduced ROM. L foot dull achy pain towards heel flared up about 2 weeks ago.       Past Medical History:  Diagnosis Date   Abnormal chest x-ray    CXR c/w COPD. Full PFTs '11 - normal   Allergy     seasonal   Anxiety    Arthritis    both knees, post traumatic - neck   Asthma    Cancer (HCC)    Cataract    removed bilateral   COVID    Depression    with anxiety.   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 attempts at in vitro fertilization   Migraines    during teenage years   MVP (mitral valve prolapse)    last 2D echo approx '10   Neuromuscular disorder (HCC)    Osteopenia    DEXA 03/2012: -1.0   Sleep apnea    Past Surgical History:  Procedure Laterality Date   APPENDECTOMY     BUNIONECTOMY WITH HAMMERTOE RECONSTRUCTION Left 07/06/13   [facility]   CARDIAC CATHETERIZATION  2006   normal study   CARPAL TUNNEL RELEASE  2011   right hand   HYSTEROPLASTY REPAIR OF UTERINE ANOMALY     2003 - laproscopically;  2006 - laparotomy   LAPAROSCOPIC ENDOMETRIOSIS FULGURATION     1996 and 2002   MANDIBLE RECONSTRUCTION     NASAL SEPTUM SURGERY  11/2012   [facility] FINGER RELEASE  2011   R thimb and ring finger   TRIGGER FINGER RELEASE  02/04/2012   [facility], in office   Social History   Socioeconomic History   Marital status: Married    Spouse name: Not on file   Number of children: 0   Years of education: 16   Highest education level: Bachelor's degree (e.g., BA, AB, BS)  Occupational History   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   Married '  94.   Denies any physical or sexual abuse.   Family in [place]: parents and sister.   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   Has smoke alarms, wears seatbelt at all times, uses helmut when appropriate, firearms present in the home, uses herbal remedies, caffeine- 2-3 per day.   No regular exercise.   Social Drivers of Corporate investment banker Strain: Low Risk  (02/18/2023)   Overall Financial Resource Strain (CARDIA)    Difficulty of Paying Living Expenses: Not hard at all  Food Insecurity: No Food Insecurity (02/18/2023)   Hunger Vital Sign    Worried About Running Out of Food in the Last Year: Never true    Ran Out of Food in the Last Year: Never true  Transportation Needs: No Transportation Needs (02/18/2023)   PRAPARE - Administrator, Civil Service (Medical): No    Lack of Transportation (Non-Medical): No  Physical Activity: Insufficiently Active (02/18/2023)   Exercise Vital Sign    Days of Exercise per  Week: 2 days    Minutes of Exercise per Session: 60 min  Stress: No Stress Concern Present (02/18/2023)   [employer] of Occupational Health - Occupational Stress Questionnaire    Feeling of Stress : Only a little  Social Connections: Moderately Integrated (02/18/2023)   Social Connection and Isolation Panel    Frequency of Communication with Friends and Family: More than three times a week    Frequency of Social Gatherings with Friends and Family: Once a week    Attends Religious Services: More than 4 times per year    Active Member of [employer] or Organizations: No    Attends Engineer, structural: Not on file    Marital Status: Married   Allergies  Allergen Reactions   Codeine Other (See Comments)    Hot flashes/passed out   Pollen Extract Itching and Cough    drainage   Anesthetics, Amide    Family History  Problem Relation Age of Onset   Allergic rhinitis Mother    Arthritis Mother        DOB 2   Cancer Mother        Thyroid  cancer   Hypertension Mother    Atrial fibrillation Mother    Melanoma Mother    Breast cancer Mother    Arthritis Father        DOB 1929   Melanoma Father    Allergic rhinitis Sister    Colon polyps Neg Hx    Rectal cancer Neg Hx    Stomach cancer Neg Hx    Colon cancer Neg Hx      Current Outpatient Medications (Cardiovascular):    cloNIDine (CATAPRES) 0.1 MG tablet, Take 0.1 mg by mouth daily.   EPINEPHrine  0.3 mg/0.3 mL IJ SOAJ injection, Inject 0.3 mg into the muscle as needed for anaphylaxis.   rosuvastatin  (CRESTOR ) 40 MG tablet, TAKE 1 TABLET BY MOUTH EVERY DAY  Current Outpatient Medications (Respiratory):    albuterol  (VENTOLIN  HFA) 108 (90 Base) MCG/ACT inhaler, Inhale 2 puffs into the lungs every 4 (four) hours as needed for wheezing or shortness of breath.   fluticasone  (FLONASE ) 50 MCG/ACT nasal spray, SPRAY 2 SPRAYS INTO EACH NOSTRIL EVERY DAY   levocetirizine (XYZAL ALLERGY  24HR) 5 MG tablet, Take 5 mg by mouth  every evening.   SYMBICORT  80-4.5 MCG/ACT inhaler, Inhale 2 puffs into the lungs 2 (two) times daily as needed.  Current Outpatient Medications (Analgesics):  aspirin  EC 81 MG tablet, Take 1 tablet (81 mg total) by mouth daily. Swallow whole.   Current Outpatient Medications (Other):    Cholecalciferol (VITAMIN D3) 50 MCG (2000 UT) capsule, Take 2,000 Units by mouth daily.   DIGESTIVE ENZYMES PO, Take 1 tablet by mouth daily as needed.   famotidine  (PEPCID ) 40 MG tablet, Take 1 tablet (40 mg total) by mouth daily.   magnesium oxide (MAG-OX) 400 (240 Mg) MG tablet, Take 400 mg by mouth daily.   metroNIDAZOLE (METROCREAM) 0.75 % cream, Apply 1 application topically 2 (two) times daily.   Omega-3 Fatty Acids (FISH OIL TRIPLE STRENGTH) 1400 MG CAPS, Take by mouth daily as needed.   omeprazole  (PRILOSEC) 20 MG capsule, Take Omeprazole  20 mg by mouth daily for 4-6 weeks, and then as needed thereafter.   Plant Sterols and Stanols (CHOLESTOFF PLUS PO), Take by mouth.   Probiotic Product (PROBIOTIC DAILY PO), Take 1 tablet by mouth daily.   sertraline  (ZOLOFT ) 25 MG tablet, Take 1 tablet (25 mg total) by mouth daily.   vitamin E 180 MG (400 UNITS) capsule, Take 400 Units by mouth daily.   Reviewed prior external information including notes and imaging from  primary care provider As well as notes that were available from care everywhere and other healthcare systems.  Past medical history, social, surgical and family history all reviewed in electronic medical record.  No pertanent information unless stated regarding to the chief complaint.   Review of Systems:  No headache, visual changes, nausea, vomiting, diarrhea, constipation, dizziness, abdominal pain, skin rash, fevers, chills, night sweats, weight loss, swollen lymph nodes, body aches, joint swelling, chest pain, shortness of breath, mood changes. POSITIVE muscle aches  Objective  Blood pressure 118/80, pulse 83, height 5' 7 (1.702 m),  weight 142 lb (64.4 kg), SpO2 94%.   General: No apparent distress alert and oriented x3 mood and affect normal, dressed appropriately.  HEENT: Pupils equal, extraocular movements intact  Respiratory: Patient's speak in full sentences and does not appear short of breath  Cardiovascular: No lower extremity edema, non tender, no erythema  Right shoulder exam shows weakness noted of the rotator cuff which is more than previous exam.  Patient has some limited range of motion noted as well.   Limited muscular skeletal ultrasound was performed and interpreted by CLAUDENE HUSSAR, M  Limited ultrasound shows patient does have a new what appears to be acute tear noted of the supraspinatus.  Different position than where the other 1 was previously.  No acute retraction noted though.   Impression and Recommendations:    The above documentation has been reviewed and is accurate and complete Rilynn Habel M Jayen Bromwell, DO

## 2023-12-31 ENCOUNTER — Encounter: Payer: Self-pay | Admitting: Family Medicine

## 2023-12-31 ENCOUNTER — Ambulatory Visit: Admitting: Family Medicine

## 2023-12-31 ENCOUNTER — Ambulatory Visit (INDEPENDENT_AMBULATORY_CARE_PROVIDER_SITE_OTHER)

## 2023-12-31 ENCOUNTER — Other Ambulatory Visit: Payer: Self-pay

## 2023-12-31 VITALS — BP 118/80 | HR 83 | Ht 67.0 in | Wt 142.0 lb

## 2023-12-31 DIAGNOSIS — M19011 Primary osteoarthritis, right shoulder: Secondary | ICD-10-CM | POA: Diagnosis not present

## 2023-12-31 DIAGNOSIS — M75111 Incomplete rotator cuff tear or rupture of right shoulder, not specified as traumatic: Secondary | ICD-10-CM

## 2023-12-31 DIAGNOSIS — M25511 Pain in right shoulder: Secondary | ICD-10-CM | POA: Diagnosis not present

## 2023-12-31 NOTE — Assessment & Plan Note (Signed)
 Discussed the potential for injections.  This is a new one comparatively.  We also discussed potentially getting MRI of the shoulder.  Patient is undergoing likely surgical intervention on her knee in the relatively near future.  Does not want to have one for the shoulder as well so would like to hold on the imaging but will consider PRP which will be scheduled at a later date.  Patient is in agreement with this.  Will follow-up with me again for that in the near future.

## 2023-12-31 NOTE — Patient Instructions (Signed)
 I'll touch base with Dalldorf Heel should get better on own Xray today PRP when good for you

## 2024-01-01 ENCOUNTER — Ambulatory Visit: Payer: Self-pay | Admitting: Family Medicine

## 2024-01-09 DIAGNOSIS — H52203 Unspecified astigmatism, bilateral: Secondary | ICD-10-CM | POA: Diagnosis not present

## 2024-01-09 DIAGNOSIS — Z961 Presence of intraocular lens: Secondary | ICD-10-CM | POA: Diagnosis not present

## 2024-01-14 ENCOUNTER — Telehealth: Payer: Self-pay

## 2024-01-14 ENCOUNTER — Telehealth: Payer: Self-pay | Admitting: Cardiology

## 2024-01-14 NOTE — Telephone Encounter (Signed)
Patient has been scheduled for televisit.

## 2024-01-14 NOTE — Telephone Encounter (Signed)
 Patient has been scheduled for telehealth med rec and consent done     Patient Consent for Virtual Visit         Renee [name] has provided verbal consent on 01/14/2024 for a virtual visit (video or telephone).   CONSENT FOR VIRTUAL VISIT FOR:  Renee [name]  By participating in this virtual visit I agree to the following:  I hereby voluntarily request, consent and authorize [facility] and its employed or contracted physicians, physician assistants, nurse practitioners or other licensed health care professionals (the Practitioner), to provide me with telemedicine health care services (the "Services) as deemed necessary by the treating Practitioner. I acknowledge and consent to receive the Services by the Practitioner via telemedicine. I understand that the telemedicine visit will involve communicating with the Practitioner through live audiovisual communication technology and the disclosure of certain medical information by electronic transmission. I acknowledge that I have been given the opportunity to request an in-person assessment or other available alternative prior to the telemedicine visit and am voluntarily participating in the telemedicine visit.  I understand that I have the right to withhold or withdraw my consent to the use of telemedicine in the course of my care at any time, without affecting my right to future care or treatment, and that the Practitioner or I may terminate the telemedicine visit at any time. I understand that I have the right to inspect all information obtained and/or recorded in the course of the telemedicine visit and may receive copies of available information for a reasonable fee.  I understand that some of the potential risks of receiving the Services via telemedicine include:  Delay or interruption in medical evaluation due to technological equipment failure or disruption; Information transmitted may not be sufficient (e.g. poor resolution of images)  to allow for appropriate medical decision making by the Practitioner; and/or  In rare instances, security protocols could fail, causing a breach of personal health information.  Furthermore, I acknowledge that it is my responsibility to provide information about my medical history, conditions and care that is complete and accurate to the best of my ability. I acknowledge that Practitioner's advice, recommendations, and/or decision may be based on factors not within their control, such as incomplete or inaccurate data provided by me or distortions of diagnostic images or specimens that may result from electronic transmissions. I understand that the practice of medicine is not an exact science and that Practitioner makes no warranties or guarantees regarding treatment outcomes. I acknowledge that a copy of this consent can be made available to me via my patient portal [facility] MyChart), or I can request a printed copy by calling the office of [facility].    I understand that my insurance will be billed for this visit.   I have read or had this consent read to me. I understand the contents of this consent, which adequately explains the benefits and risks of the Services being provided via telemedicine.  I have been provided ample opportunity to ask questions regarding this consent and the Services and have had my questions answered to my satisfaction. I give my informed consent for the services to be provided through the use of telemedicine in my medical care

## 2024-01-14 NOTE — Telephone Encounter (Signed)
   Pre-operative Risk Assessment    Patient Name: Renee [name]  DOB: [birth year] MRN: [id number]   Date of last office visit: 02/22/23 Date of next office visit: 03/12/24   Request for Surgical Clearance    Procedure:  left knee scope  Date of Surgery:  Clearance 02/05/24                                Surgeon:  Dr. Dalldorf Surgeon's Group or Practice Name:  [facility] Phone number:  [phone] Fax number:  [phone]   Type of Clearance Requested:   - Medical  - Pharmacy:  Hold        Type of Anesthesia:  Not Indicated   Additional requests/questions:     SignedBarbee DELENA Sharps   01/14/2024, 2:37 PM

## 2024-01-14 NOTE — Telephone Encounter (Signed)
   Name: Renee [name]  DOB: 08-08-1960  MRN: [id number]  Primary Cardiologist: Redell Shallow, MD   Preoperative team, please contact this patient and set up a phone call appointment for further preoperative risk assessment. Please obtain consent and complete medication review. Thank you for your help. Last seen on 02/26/2023 by Dr. Shallow  I confirm that guidance regarding antiplatelet and oral anticoagulation therapy has been completed and, if necessary, noted below. On ASA but not specifically requested to hold.  I also confirmed the patient resides in the state of [place] . As per [facility] Medical Board telemedicine laws, the patient must reside in the state in which the provider is licensed.   Lamarr Satterfield, NP 01/14/2024, 2:43 PM [facility]

## 2024-01-16 ENCOUNTER — Ambulatory Visit: Attending: Cardiovascular Disease | Admitting: Emergency Medicine

## 2024-01-16 DIAGNOSIS — Z01810 Encounter for preprocedural cardiovascular examination: Secondary | ICD-10-CM | POA: Diagnosis not present

## 2024-01-16 NOTE — Progress Notes (Signed)
 Virtual Visit via Telephone Note   Because of Renee [name] co-morbid illnesses, she is at least at moderate risk for complications without adequate follow up.  This format is felt to be most appropriate for this patient at this time.  Due to technical limitations with video connection (technology), today's appointment will be conducted as an audio only telehealth visit, and Yasmine Kilbourne verbally agreed to proceed in this manner.   All issues noted in this document were discussed and addressed.  No physical exam could be performed with this format.  Evaluation Performed:  Preoperative cardiovascular risk assessment _____________   Date:  01/16/2024   Patient ID:  Renee [name], DOB Jun 07, 1961, MRN [id number] Patient Location:  Home Provider location:   Office  Primary Care Provider:  Rollene Almarie LABOR, MD Primary Cardiologist:  Redell Shallow, MD  Chief Complaint / Patient Profile   63 y.o. y/o female with a h/o coronary artery disease, pulmonary nodule, hyperlipidemia, mitral prolapse, palpitations, dilated ascending aorta who is pending left knee scope on 02/05/2024 with [facility] and presents today for telephonic preoperative cardiovascular risk assessment.  History of Present Illness    Renee [name] is a 63 y.o. female who presents via audio/video conferencing for a telehealth visit today.  Pt was last seen in cardiology clinic on 02/18/2023 by Dr. Shallow.  At that time Deondrea Aguado was doing well.  The patient is now pending procedure as outlined above. Since her last visit, she denies chest pain, shortness of breath, lower extremity edema, fatigue, palpitations, melena, hematuria, hemoptysis, diaphoresis, weakness, presyncope, syncope, orthopnea, and PND.  Today patient is doing well overall.  She is without acute cardiovascular concerns or complaints at this time.  She denies any anginal symptoms, chest pain, dyspnea.  Palpitations are well-controlled.  Overall she  stays relatively active.  She does Pilates once a week without exertional symptoms.  She walks, does household chores, and various other activities without it angina.  Overall she is able to complete greater than 4 METS.  Past Medical History    Past Medical History:  Diagnosis Date   Abnormal chest x-ray    CXR c/w COPD. Full PFTs '11 - normal   Allergy     seasonal   Anxiety    Arthritis    both knees, post traumatic - neck   Asthma    Cancer (HCC)    Cataract    removed bilateral   COVID    Depression    with anxiety.   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 attempts at in vitro fertilization   Migraines    during teenage years   MVP (mitral valve prolapse)    last 2D echo approx '10   Neuromuscular disorder (HCC)    Osteopenia    DEXA 03/2012: -1.0   Sleep apnea    Past Surgical History:  Procedure Laterality Date   APPENDECTOMY     BUNIONECTOMY WITH HAMMERTOE RECONSTRUCTION Left 07/06/13   [facility]   CARDIAC CATHETERIZATION  2006   normal study   CARPAL TUNNEL RELEASE  2011   right hand   HYSTEROPLASTY REPAIR OF UTERINE ANOMALY     2003 - laproscopically; 2006 - laparotomy   LAPAROSCOPIC ENDOMETRIOSIS FULGURATION     1996 and 2002   MANDIBLE RECONSTRUCTION     NASAL SEPTUM SURGERY  11/2012   [facility] FINGER RELEASE  2011   R thimb and ring finger   TRIGGER FINGER RELEASE  02/04/2012   [facility], in office    Allergies  Allergies  Allergen Reactions   Codeine Other (See Comments)    Hot flashes/passed out   Pollen Extract Itching and Cough    drainage   Anesthetics, Amide     Home Medications    Prior to Admission medications   Medication Sig Start Date End Date Taking? Authorizing Provider  albuterol  (VENTOLIN  HFA) 108 (90 Base) MCG/ACT inhaler Inhale 2 puffs into the lungs every 4 (four) hours as needed for  wheezing or shortness of breath. 11/07/22   Iva Marty Saltness, MD  aspirin  EC 81 MG tablet Take 1 tablet (81 mg total) by mouth daily. Swallow whole. 09/03/21   Walker, Caitlin S, NP  Cholecalciferol (VITAMIN D3) 50 MCG (2000 UT) capsule Take 2,000 Units by mouth daily.    [provider]  cloNIDine (CATAPRES) 0.1 MG tablet Take 0.1 mg by mouth daily. 02/19/23   [provider]  DIGESTIVE ENZYMES PO Take 1 tablet by mouth daily as needed.    [provider]  EPINEPHrine  0.3 mg/0.3 mL IJ SOAJ injection Inject 0.3 mg into the muscle as needed for anaphylaxis. 08/27/23   Jeneal Danita Macintosh, MD  famotidine  (PEPCID ) 40 MG tablet Take 1 tablet (40 mg total) by mouth daily. 02/20/23   Zehr, Jessica D, PA-C  fluticasone  (FLONASE ) 50 MCG/ACT nasal spray SPRAY 2 SPRAYS INTO EACH NOSTRIL EVERY DAY 04/10/21   Rollene Almarie LABOR, MD  levocetirizine (XYZAL ALLERGY  24HR) 5 MG tablet Take 5 mg by mouth every evening.    [provider]  magnesium oxide (MAG-OX) 400 (240 Mg) MG tablet Take 400 mg by mouth daily.    [provider]  metroNIDAZOLE (METROCREAM) 0.75 % cream Apply 1 application topically 2 (two) times daily. 08/01/20   [provider]  Omega-3 Fatty Acids (FISH OIL TRIPLE STRENGTH) 1400 MG CAPS Take by mouth daily as needed.    [provider]  omeprazole  (PRILOSEC) 20 MG capsule Take Omeprazole  20 mg by mouth daily for 4-6 weeks, and then as needed thereafter. 04/17/23   Armbruster, Elspeth SQUIBB, MD  Plant Sterols and Stanols (CHOLESTOFF PLUS PO) Take by mouth.    [provider]  Probiotic Product (PROBIOTIC DAILY PO) Take 1 tablet by mouth daily.    [provider]  rosuvastatin  (CRESTOR ) 40 MG tablet TAKE 1 TABLET BY MOUTH EVERY DAY 08/06/23   Pietro Redell RAMAN, MD  sertraline  (ZOLOFT ) 25 MG tablet Take 1 tablet (25 mg total) by mouth daily. 09/02/23   Rollene Almarie LABOR, MD  SYMBICORT  80-4.5 MCG/ACT inhaler Inhale 2  puffs into the lungs 2 (two) times daily as needed. 11/07/22   Iva Marty Saltness, MD  vitamin E 180 MG (400 UNITS) capsule Take 400 Units by mouth daily.    [provider]    Physical Exam    Vital Signs:  Nairi Oswald does not have vital signs available for review today  Given telephonic nature of communication, physical exam is limited. AAOx3. NAD. Normal affect.  Speech and respirations are unlabored.  Accessory Clinical Findings    None  Assessment & Plan    1.  Preoperative Cardiovascular Risk Assessment: According to the Revised Cardiac Risk Index (RCRI), her Perioperative Risk of Major Cardiac Event is (%): 0.4. Her Functional Capacity in METs is: 5.62 according to  the Duke Activity Status Index (DASI). Therefore, based on ACC/AHA guidelines, patient would be at acceptable risk for the planned procedure without further cardiovascular testing. I will route this recommendation to the requesting party via Epic fax function.  The patient was advised that if she develops new symptoms prior to surgery to contact our office to arrange for a follow-up visit, and she verbalized understanding.  She may hold Aspirin  for 5-7 days prior to procedure. Please resume Aspirin  as soon as possible postprocedure, at the discretion of the surgeon.    A copy of this note will be routed to requesting surgeon.  Time:   Today, I have spent 15 minutes with the patient with telehealth technology discussing medical history, symptoms, and management plan.     Lum LITTIE Louis, NP  01/16/2024, 2:43 PM

## 2024-01-25 ENCOUNTER — Encounter: Payer: Self-pay | Admitting: Family Medicine

## 2024-01-27 NOTE — Progress Notes (Unsigned)
 [name] [address] [place] [address] Phone: [phone] Subjective:   [name], am serving as a scribe for Dr. Arthea Claudene.  I'm seeing this patient by the request  of:  Rollene Almarie LABOR, MD  CC: Right shoulder pain  [name]  Renee [name] is a 63 y.o. female coming in with complaint of R shoulder pain. Here for PRP today. Patient states that her pain is worsening. Unable to sleep at night.        Past Medical History:  Diagnosis Date   Abnormal chest x-ray    CXR c/w COPD. Full PFTs '11 - normal   Allergy     seasonal   Anxiety    Arthritis    both knees, post traumatic - neck   Asthma    Cancer (HCC)    Cataract    removed bilateral   COVID    Depression    with anxiety.   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 attempts at in vitro fertilization   Migraines    during teenage years   MVP (mitral valve prolapse)    last 2D echo approx '10   Neuromuscular disorder (HCC)    Osteopenia    DEXA 03/2012: -1.0   Sleep apnea    Past Surgical History:  Procedure Laterality Date   APPENDECTOMY     BUNIONECTOMY WITH HAMMERTOE RECONSTRUCTION Left 07/06/13   [facility]   CARDIAC CATHETERIZATION  2006   normal study   CARPAL TUNNEL RELEASE  2011   right hand   HYSTEROPLASTY REPAIR OF UTERINE ANOMALY     2003 - laproscopically; 2006 - laparotomy   LAPAROSCOPIC ENDOMETRIOSIS FULGURATION     1996 and 2002   MANDIBLE RECONSTRUCTION     NASAL SEPTUM SURGERY  11/2012   [facility] FINGER RELEASE  2011   R thimb and ring finger   TRIGGER FINGER RELEASE  02/04/2012   [facility], in office   Social History   Socioeconomic History   Marital status: Married    Spouse name: Not on file   Number of children: 0   Years of education: 16   Highest education level: Bachelor's degree  (e.g., BA, AB, BS)  Occupational History   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   Married '94.   Denies any physical or sexual abuse.   Family in [place]: parents and sister.   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   Has smoke alarms, wears seatbelt at all times, uses helmut when appropriate, firearms present in the home, uses herbal remedies, caffeine- 2-3 per day.   No regular exercise.   Social Drivers of Corporate investment banker Strain: Low Risk  (02/18/2023)   Overall Financial Resource Strain (CARDIA)    Difficulty of Paying Living  Expenses: Not hard at all  Food Insecurity: No Food Insecurity (02/18/2023)   Hunger Vital Sign    Worried About Running Out of Food in the Last Year: Never true    Ran Out of Food in the Last Year: Never true  Transportation Needs: No Transportation Needs (02/18/2023)   PRAPARE - Administrator, Civil Service (Medical): No    Lack of Transportation (Non-Medical): No  Physical Activity: Insufficiently Active (02/18/2023)   Exercise Vital Sign    Days of Exercise per Week: 2 days    Minutes of Exercise per Session: 60 min  Stress: No Stress Concern Present (02/18/2023)   [employer] of Occupational Health - Occupational Stress Questionnaire    Feeling of Stress : Only a little  Social Connections: Moderately Integrated (02/18/2023)   Social Connection and Isolation Panel    Frequency of Communication with Friends and Family: More than three times a week    Frequency of Social Gatherings with Friends and Family:  Once a week    Attends Religious Services: More than 4 times per year    Active Member of [employer] or Organizations: No    Attends Engineer, structural: Not on file    Marital Status: Married   Allergies  Allergen Reactions   Codeine Other (See Comments)    Hot flashes/passed out   Pollen Extract Itching and Cough    drainage   Anesthetics, Amide    Family History  Problem Relation Age of Onset   Allergic rhinitis Mother    Arthritis Mother        DOB 33   Cancer Mother        Thyroid  cancer   Hypertension Mother    Atrial fibrillation Mother    Melanoma Mother    Breast cancer Mother    Arthritis Father        DOB 1929   Melanoma Father    Allergic rhinitis Sister    Colon polyps Neg Hx    Rectal cancer Neg Hx    Stomach cancer Neg Hx    Colon cancer Neg Hx      Current Outpatient Medications (Cardiovascular):    cloNIDine (CATAPRES) 0.1 MG tablet, Take 0.1 mg by mouth daily.   EPINEPHrine  0.3 mg/0.3 mL IJ SOAJ injection, Inject 0.3 mg into the muscle as needed for anaphylaxis.   rosuvastatin  (CRESTOR ) 40 MG tablet, TAKE 1 TABLET BY MOUTH EVERY DAY  Current Outpatient Medications (Respiratory):    albuterol  (VENTOLIN  HFA) 108 (90 Base) MCG/ACT inhaler, Inhale 2 puffs into the lungs every 4 (four) hours as needed for wheezing or shortness of breath.   fluticasone  (FLONASE ) 50 MCG/ACT nasal spray, SPRAY 2 SPRAYS INTO EACH NOSTRIL EVERY DAY   levocetirizine (XYZAL ALLERGY  24HR) 5 MG tablet, Take 5 mg by mouth every evening.   SYMBICORT  80-4.5 MCG/ACT inhaler, Inhale 2 puffs into the lungs 2 (two) times daily as needed.  Current Outpatient Medications (Analgesics):    aspirin  EC 81 MG tablet, Take 1 tablet (81 mg total) by mouth daily. Swallow whole.   Current Outpatient Medications (Other):    Cholecalciferol (VITAMIN D3) 50 MCG (2000 UT) capsule, Take 2,000 Units by mouth daily.   DIGESTIVE ENZYMES PO, Take 1 tablet by mouth daily as needed.   famotidine   (PEPCID ) 40 MG tablet, Take 1 tablet (40 mg total) by mouth daily.   magnesium oxide (MAG-OX) 400 (240 Mg) MG tablet, Take 400 mg by mouth daily.   metroNIDAZOLE (  METROCREAM) 0.75 % cream, Apply 1 application topically 2 (two) times daily.   Omega-3 Fatty Acids (FISH OIL TRIPLE STRENGTH) 1400 MG CAPS, Take by mouth daily as needed.   omeprazole  (PRILOSEC) 20 MG capsule, Take Omeprazole  20 mg by mouth daily for 4-6 weeks, and then as needed thereafter.   Plant Sterols and Stanols (CHOLESTOFF PLUS PO), Take by mouth.   Probiotic Product (PROBIOTIC DAILY PO), Take 1 tablet by mouth daily.   sertraline  (ZOLOFT ) 25 MG tablet, Take 1 tablet (25 mg total) by mouth daily.   vitamin E 180 MG (400 UNITS) capsule, Take 400 Units by mouth daily.     Objective  There were no vitals taken for this visit.   General: No apparent distress alert and oriented x3 mood and affect normal, dressed appropriately.   Procedure: Real-time Ultrasound Guided Injection of right glenohumeral joint Device: GE Logiq Q7  Ultrasound guided injection is preferred based studies that show increased duration, increased effect, greater accuracy, decreased procedural pain, increased response rate with ultrasound guided versus blind injection.  Verbal informed consent obtained.  Time-out conducted.  Noted no overlying erythema, induration, or other signs of local infection.  Skin prepped in a sterile fashion.  Local anesthesia: Topical Ethyl chloride.  With sterile technique and under real time ultrasound guidance:  Joint visualized.  23g 1  inch needle inserted lateral approach. Pictures taken for needle placement. Patient did have injection of 2 cc of 0.5% Marcaine, then injected 4 cc of precentrifuge PRP into the supraspinatus tendon Completed without difficulty  Pain immediately resolved suggesting accurate placement of the medication.  Advised to call if fevers/chills, erythema, induration, drainage, or persistent  bleeding.  Impression: Technically successful ultrasound guided injection.   Impression and Recommendations:    The above documentation has been reviewed and is accurate and complete Estill Llerena M Zakk Borgen, DO

## 2024-01-29 ENCOUNTER — Encounter: Payer: Self-pay | Admitting: Family Medicine

## 2024-01-29 ENCOUNTER — Ambulatory Visit (INDEPENDENT_AMBULATORY_CARE_PROVIDER_SITE_OTHER): Payer: Self-pay | Admitting: Family Medicine

## 2024-01-29 ENCOUNTER — Other Ambulatory Visit: Payer: Self-pay

## 2024-01-29 VITALS — BP 114/82 | HR 72 | Ht 67.0 in

## 2024-01-29 DIAGNOSIS — M75111 Incomplete rotator cuff tear or rupture of right shoulder, not specified as traumatic: Secondary | ICD-10-CM

## 2024-01-29 NOTE — Assessment & Plan Note (Signed)
 Repeat injection given today, tolerated the procedure well, discussed icing regimen and home exercises, discussed the post PRP handout before getting back to the icing regimen.

## 2024-01-29 NOTE — Patient Instructions (Signed)
 No ice or ibuprofen for 3 days Tylenol and heat are ok Follow exercises as instructed Follow up in 7 weeks

## 2024-02-05 DIAGNOSIS — X58XXXA Exposure to other specified factors, initial encounter: Secondary | ICD-10-CM | POA: Diagnosis not present

## 2024-02-05 DIAGNOSIS — S83232A Complex tear of medial meniscus, current injury, left knee, initial encounter: Secondary | ICD-10-CM | POA: Diagnosis not present

## 2024-02-05 DIAGNOSIS — M2242 Chondromalacia patellae, left knee: Secondary | ICD-10-CM | POA: Diagnosis not present

## 2024-02-05 DIAGNOSIS — Y999 Unspecified external cause status: Secondary | ICD-10-CM | POA: Diagnosis not present

## 2024-02-05 DIAGNOSIS — G8918 Other acute postprocedural pain: Secondary | ICD-10-CM | POA: Diagnosis not present

## 2024-02-13 DIAGNOSIS — M25662 Stiffness of left knee, not elsewhere classified: Secondary | ICD-10-CM | POA: Diagnosis not present

## 2024-02-13 DIAGNOSIS — M6281 Muscle weakness (generalized): Secondary | ICD-10-CM | POA: Diagnosis not present

## 2024-02-17 DIAGNOSIS — L309 Dermatitis, unspecified: Secondary | ICD-10-CM | POA: Diagnosis not present

## 2024-02-17 DIAGNOSIS — Z85828 Personal history of other malignant neoplasm of skin: Secondary | ICD-10-CM | POA: Diagnosis not present

## 2024-03-01 NOTE — Progress Notes (Signed)
 HPI: FU palpitations and coronary artery disease. Echocardiogram November 2021 showed normal LV function, trace mitral regurgitation. Cardiac CTA March 2023 showed ascending aorta of 3.9 cm and unchanged 3 mm nodule in the left lung base; calcium  score 146 which was 92nd percentile; moderate 50 to 69% stenosis in the proximal LAD.  CPX April 2024 demonstrated normal functional capacity with no clear indication of cardiopulmonary abnormality or exercise-induced bronchospasm.  Chest CT February 2025 showed 4 cm ascending aortic aneurysm and coronary calcification.  Since last seen she denies increased dyspnea, chest pain, palpitations or syncope.  She is describing pain in her right shoulder and wonders whether statin could be contributing.  Current Outpatient Medications  Medication Sig Dispense Refill   albuterol  (VENTOLIN  HFA) 108 (90 Base) MCG/ACT inhaler Inhale 2 puffs into the lungs every 4 (four) hours as needed for wheezing or shortness of breath. 18 g 1   aspirin  EC 81 MG tablet Take 1 tablet (81 mg total) by mouth daily. Swallow whole. 90 tablet 3   DIGESTIVE ENZYMES PO Take 1 tablet by mouth daily as needed.     EPINEPHrine  0.3 mg/0.3 mL IJ SOAJ injection Inject 0.3 mg into the muscle as needed for anaphylaxis. 0.3 mL 0   famotidine  (PEPCID ) 40 MG tablet Take 1 tablet (40 mg total) by mouth daily. 90 tablet 3   fluticasone  (FLONASE ) 50 MCG/ACT nasal spray SPRAY 2 SPRAYS INTO EACH NOSTRIL EVERY DAY 48 mL 3   levocetirizine (XYZAL ALLERGY  24HR) 5 MG tablet Take 5 mg by mouth every evening.     magnesium oxide (MAG-OX) 400 (240 Mg) MG tablet Take 400 mg by mouth daily.     metroNIDAZOLE (METROCREAM) 0.75 % cream Apply 1 application topically 2 (two) times daily.     Omega-3 Fatty Acids (FISH OIL TRIPLE STRENGTH) 1400 MG CAPS Take by mouth daily as needed.     omeprazole  (PRILOSEC) 20 MG capsule Take Omeprazole  20 mg by mouth daily for 4-6 weeks, and then as needed thereafter. 90 capsule 1    Plant Sterols and Stanols (CHOLESTOFF PLUS PO) Take by mouth.     Probiotic Product (PROBIOTIC DAILY PO) Take 1 tablet by mouth daily.     rosuvastatin  (CRESTOR ) 40 MG tablet TAKE 1 TABLET BY MOUTH EVERY DAY 90 tablet 3   SYMBICORT  80-4.5 MCG/ACT inhaler Inhale 2 puffs into the lungs 2 (two) times daily as needed. 10.2 g 2   vitamin E 180 MG (400 UNITS) capsule Take 400 Units by mouth daily.     Cholecalciferol (VITAMIN D3) 50 MCG (2000 UT) capsule Take 2,000 Units by mouth daily.     cloNIDine (CATAPRES) 0.1 MG tablet Take 0.1 mg by mouth daily.     sertraline  (ZOLOFT ) 25 MG tablet Take 1 tablet (25 mg total) by mouth daily. 90 tablet 1   No current facility-administered medications for this visit.     Past Medical History:  Diagnosis Date   Abnormal chest x-ray    CXR c/w COPD. Full PFTs '11 - normal   Allergy     seasonal   Anxiety    Arthritis    both knees, post traumatic - neck   Asthma    Cancer (HCC)    Cataract    removed bilateral   COVID    Depression    with anxiety.   Dyslipidemia    GERD (gastroesophageal reflux disease)    Hx of colonic polyps 2008   Hyperlipidemia    IBS (  irritable bowel syndrome)    Infertility, female    failed 3 attempts at in vitro fertilization   Migraines    during teenage years   MVP (mitral valve prolapse)    last 2D echo approx '10   Neuromuscular disorder (HCC)    Osteopenia    DEXA 03/2012: -1.0   Sleep apnea     Past Surgical History:  Procedure Laterality Date   APPENDECTOMY     BUNIONECTOMY WITH HAMMERTOE RECONSTRUCTION Left 07/06/13   [facility]   CARDIAC CATHETERIZATION  2006   normal study   CARPAL TUNNEL RELEASE  2011   right hand   HYSTEROPLASTY REPAIR OF UTERINE ANOMALY     2003 - laproscopically; 2006 - laparotomy   LAPAROSCOPIC ENDOMETRIOSIS FULGURATION     1996 and 2002   MANDIBLE RECONSTRUCTION     NASAL SEPTUM SURGERY  11/2012   [facility] FINGER RELEASE  2011   R thimb and ring finger   TRIGGER  FINGER RELEASE  02/04/2012   [facility], in office    Social History   Socioeconomic History   Marital status: Married    Spouse name: Not on file   Number of children: 0   Years of education: 16   Highest education level: Bachelor's degree (e.g., BA, AB, BS)  Occupational History   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   Married '94.   Denies any physical or sexual abuse.   Family in [place]: parents and sister.   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   Has smoke alarms, wears seatbelt at all times, uses helmut when appropriate, firearms present in the home, uses herbal remedies, caffeine- 2-3 per day.   No regular exercise.   Social Drivers of Corporate investment banker Strain: Low Risk  (02/18/2023)   Overall Financial Resource Strain (CARDIA)    Difficulty of Paying Living Expenses: Not hard at all  Food Insecurity: No Food Insecurity (02/18/2023)   Hunger Vital Sign    Worried About Running Out of Food in the Last Year: Never true    Ran Out of Food in the Last Year: Never true  Transportation Needs: No Transportation Needs (02/18/2023)   PRAPARE - Administrator, Civil Service (Medical): No    Lack of Transportation (Non-Medical): No  Physical Activity: Insufficiently Active (02/18/2023)   Exercise Vital Sign    Days of Exercise per Week: 2 days    Minutes of Exercise per Session: 60 min  Stress: No Stress Concern Present (02/18/2023)   [employer] of Occupational Health - Occupational Stress Questionnaire    Feeling of Stress : Only a  little  Social Connections: Moderately Integrated (02/18/2023)   Social Connection and Isolation Panel    Frequency of Communication with Friends and Family: More than three times a week    Frequency of Social Gatherings with Friends and Family: Once a week    Attends Religious Services: More than 4 times per year  Active Member of Clubs or Organizations: No    Attends Banker Meetings: Not on file    Marital Status: Married  Catering manager Violence: Not on file    Family History  Problem Relation Age of Onset   Allergic rhinitis Mother    Arthritis Mother        DOB 95   Cancer Mother        Thyroid  cancer   Hypertension Mother    Atrial fibrillation Mother    Melanoma Mother    Breast cancer Mother    Arthritis Father        DOB 1929   Melanoma Father    Allergic rhinitis Sister    Colon polyps Neg Hx    Rectal cancer Neg Hx    Stomach cancer Neg Hx    Colon cancer Neg Hx     ROS: no fevers or chills, productive cough, hemoptysis, dysphasia, odynophagia, melena, hematochezia, dysuria, hematuria, rash, seizure activity, orthopnea, PND, pedal edema, claudication. Remaining systems are negative.  Physical Exam: Well-developed well-nourished in no acute distress.  Skin is warm and dry.  HEENT is normal.  Neck is supple.  Chest is clear to auscultation with normal expansion.  Cardiovascular exam is regular rate and rhythm.  Abdominal exam nontender or distended. No masses palpated. Extremities show no edema. neuro grossly intact  EKG Interpretation Date/Time:  Friday March 12 2024 10:35:35 EDT Ventricular Rate:  80 PR Interval:  144 QRS Duration:  130 QT Interval:  396 QTC Calculation: 456 R Axis:   102  Text Interpretation: Normal sinus rhythm Right bundle branch block Confirmed by Pietro Rogue ([id number]) on 03/12/2024 10:50:39 AM    A/P  1 coronary artery disease-moderate disease noted in LAD on previous cardiac CTA.  She is not having  chest pain.  Continue aspirin .  2 history of palpitations-no recent symptoms.  Will consider addition of beta-blockade in the future if her symptoms worsen.  3 hyperlipidemia-patient is having recurrent difficulties with tears and pain in her right shoulder and wonders whether the statin is contributing.  I think this is unlikely but will hold Crestor  for 8 weeks.  If symptoms improve we will consider PCSK9 inhibitor.  If not we will resume Crestor  at present dose.  Will also check baseline CK.  4 dilated aortic root-we will plan repeat CTA February 2026.  5 question history of mitral valve prolapse-not evident on most recent echocardiogram.  Rogue Pietro, MD

## 2024-03-02 ENCOUNTER — Ambulatory Visit: Admitting: Cardiology

## 2024-03-08 ENCOUNTER — Encounter: Payer: Self-pay | Admitting: Cardiology

## 2024-03-08 DIAGNOSIS — E785 Hyperlipidemia, unspecified: Secondary | ICD-10-CM

## 2024-03-08 DIAGNOSIS — I25118 Atherosclerotic heart disease of native coronary artery with other forms of angina pectoris: Secondary | ICD-10-CM

## 2024-03-10 DIAGNOSIS — E785 Hyperlipidemia, unspecified: Secondary | ICD-10-CM | POA: Diagnosis not present

## 2024-03-10 DIAGNOSIS — I25118 Atherosclerotic heart disease of native coronary artery with other forms of angina pectoris: Secondary | ICD-10-CM | POA: Diagnosis not present

## 2024-03-11 ENCOUNTER — Ambulatory Visit: Payer: Self-pay | Admitting: Cardiology

## 2024-03-11 LAB — COMPREHENSIVE METABOLIC PANEL WITH GFR
ALT: 21 IU/L (ref 0–32)
AST: 31 IU/L (ref 0–40)
Albumin: 4.7 g/dL (ref 3.9–4.9)
Alkaline Phosphatase: 96 IU/L (ref 44–121)
BUN/Creatinine Ratio: 13 (ref 12–28)
BUN: 12 mg/dL (ref 8–27)
Bilirubin Total: 1 mg/dL (ref 0.0–1.2)
CO2: 24 mmol/L (ref 20–29)
Calcium: 9.5 mg/dL (ref 8.7–10.3)
Chloride: 100 mmol/L (ref 96–106)
Creatinine, Ser: 0.92 mg/dL (ref 0.57–1.00)
Globulin, Total: 2.4 g/dL (ref 1.5–4.5)
Glucose: 85 mg/dL (ref 70–99)
Potassium: 4.3 mmol/L (ref 3.5–5.2)
Sodium: 140 mmol/L (ref 134–144)
Total Protein: 7.1 g/dL (ref 6.0–8.5)
eGFR: 70 mL/min/1.73 (ref >59)

## 2024-03-11 LAB — LIPID PANEL
Chol/HDL Ratio: 1.9 ratio (ref 0.0–4.4)
Cholesterol, Total: 151 mg/dL (ref 100–199)
HDL: 81 mg/dL (ref >39)
LDL Chol Calc (NIH): 59 mg/dL (ref 0–99)
Triglycerides: 52 mg/dL (ref 0–149)
VLDL Cholesterol Cal: 11 mg/dL (ref 5–40)

## 2024-03-11 LAB — LIPOPROTEIN A (LPA): Lipoprotein (a): 14.8 nmol/L (ref <75.0)

## 2024-03-12 ENCOUNTER — Ambulatory Visit: Attending: Cardiology | Admitting: Cardiology

## 2024-03-12 ENCOUNTER — Encounter: Payer: Self-pay | Admitting: Cardiology

## 2024-03-12 VITALS — BP 104/70 | HR 80 | Ht 67.5 in | Wt 143.6 lb

## 2024-03-12 DIAGNOSIS — E785 Hyperlipidemia, unspecified: Secondary | ICD-10-CM | POA: Diagnosis not present

## 2024-03-12 DIAGNOSIS — I77819 Aortic ectasia, unspecified site: Secondary | ICD-10-CM

## 2024-03-12 DIAGNOSIS — I25118 Atherosclerotic heart disease of native coronary artery with other forms of angina pectoris: Secondary | ICD-10-CM | POA: Diagnosis not present

## 2024-03-12 NOTE — Patient Instructions (Signed)
 Medication Instructions:   STOP ROSUVASTATIN  FOR 2 WEEKS AND THEN LET US  KNOW HOW YOU FEEL  *If you need a refill on your cardiac medications before your next appointment, please call your pharmacy*  Follow-Up: At [facility], you and your health needs are our priority.  As part of our continuing mission to provide you with exceptional heart care, our providers are all part of one team.  This team includes your primary Cardiologist (physician) and Advanced Practice Providers or APPs (Physician Assistants and Nurse Practitioners) who all work together to provide you with the care you need, when you need it.  Your next appointment:   12 month(s)  Provider:   Redell Shallow, MD

## 2024-03-13 ENCOUNTER — Ambulatory Visit: Payer: Self-pay | Admitting: Cardiology

## 2024-03-13 LAB — CK: Total CK: 152 U/L (ref 32–182)

## 2024-03-18 NOTE — Progress Notes (Unsigned)
 Renee [name] Renee [name] [name] [address] [place] [address] Phone: [phone] Subjective:   Renee [name], am serving as a scribe for Dr. Arthea [name].  I'm seeing this patient by the request  of:  Rollene Almarie LABOR, MD  CC: Right shoulder pain  [name]  01/29/2024 Repeat injection given today, tolerated the procedure well, discussed icing regimen and home exercises, discussed the post PRP handout before getting back to the icing regimen.     Updated 03/19/2024 Renee [name] is a 63 y.o. female coming in with complaint of R shoulder pain. PRP f/u. Patient states that she does not feel like PRP helped. R is worsening and L shoulder is now hurting as well. Pain over anterior aspect and with flexion.   Going off statin for 2 months to see if her pain decreases.      Past Medical History:  Diagnosis Date   Abnormal chest x-ray    CXR c/w COPD. Full PFTs '11 - normal   Allergy     seasonal   Anxiety    Arthritis    both knees, post traumatic - neck   Asthma    Cancer (HCC)    Cataract    removed bilateral   COVID    Depression    with anxiety.   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 attempts at in vitro fertilization   Migraines    during teenage years   MVP (mitral valve prolapse)    last 2D echo approx '10   Neuromuscular disorder (HCC)    Osteopenia    DEXA 03/2012: -1.0   Sleep apnea    Past Surgical History:  Procedure Laterality Date   APPENDECTOMY     BUNIONECTOMY WITH HAMMERTOE RECONSTRUCTION Left 07/06/13   [facility]   CARDIAC CATHETERIZATION  2006   normal study   CARPAL TUNNEL RELEASE  2011   right hand   HYSTEROPLASTY REPAIR OF UTERINE ANOMALY     2003 - laproscopically; 2006 - laparotomy   LAPAROSCOPIC ENDOMETRIOSIS FULGURATION     1996 and 2002   MANDIBLE RECONSTRUCTION     NASAL  SEPTUM SURGERY  11/2012   [facility] FINGER RELEASE  2011   R thimb and ring finger   TRIGGER FINGER RELEASE  02/04/2012   [place], in office   Social History   Socioeconomic History   Marital status: Married    Spouse name: Not on file   Number of children: 0   Years of education: 16   Highest education level: Bachelor's degree (e.g., BA, AB, BS)  Occupational History   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   Married '94.   Denies any physical or sexual abuse.   Family in [place]: parents and sister.   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  Has smoke alarms, wears seatbelt at all times, uses helmut when appropriate, firearms present in the home, uses herbal remedies, caffeine- 2-3 per day.   No regular exercise.   Social Drivers of Corporate investment banker Strain: Low Risk  (02/18/2023)   Overall Financial Resource Strain (CARDIA)    Difficulty of Paying Living Expenses: Not hard at all  Food Insecurity: No Food Insecurity (02/18/2023)   Hunger Vital Sign    Worried About Running Out of Food in the Last Year: Never true    Ran Out of Food in the Last Year: Never true  Transportation Needs: No Transportation Needs (02/18/2023)   PRAPARE - Administrator, Civil Service (Medical): No    Lack of Transportation (Non-Medical): No  Physical Activity: Insufficiently Active (02/18/2023)   Exercise Vital Sign    Days of Exercise per Week: 2 days    Minutes of Exercise per Session: 60 min  Stress: No Stress Concern Present (02/18/2023)   [employer] of Occupational Health - Occupational Stress Questionnaire    Feeling of Stress : Only a little  Social Connections: Moderately Integrated (02/18/2023)   Social Connection and Isolation Panel    Frequency of Communication with Friends and Family: More than three times a week    Frequency of Social Gatherings with Friends and Family: Once a week    Attends Religious Services: More than 4 times per year    Active Member of [employer] or Organizations: No    Attends Engineer, structural: Not on file    Marital Status: Married   Allergies  Allergen Reactions   Codeine Other (See Comments)    Hot flashes/passed out   Pollen Extract Itching and Cough    drainage   Anesthetics, Amide    Family History  Problem Relation Age of Onset   Allergic rhinitis Mother    Arthritis Mother        DOB 41   Cancer Mother        Thyroid  cancer   Hypertension Mother    Atrial fibrillation Mother    Melanoma Mother    Breast cancer Mother    Arthritis Father        DOB 1929   Melanoma Father    Allergic rhinitis Sister    Colon polyps Neg Hx    Rectal cancer Neg Hx    Stomach cancer Neg Hx    Colon cancer Neg Hx      Current Outpatient Medications (Cardiovascular):    EPINEPHrine  0.3 mg/0.3 mL IJ SOAJ injection, Inject 0.3 mg into the muscle as needed for anaphylaxis.   rosuvastatin  (CRESTOR ) 40 MG tablet, TAKE 1 TABLET BY MOUTH EVERY DAY   cloNIDine (CATAPRES) 0.1 MG tablet, Take 0.1 mg by mouth daily.  Current Outpatient Medications (Respiratory):    albuterol  (VENTOLIN  HFA) 108 (90 Base) MCG/ACT inhaler, Inhale 2 puffs into the lungs every 4 (four) hours as needed for wheezing or shortness of breath.   fluticasone  (FLONASE ) 50 MCG/ACT nasal spray, SPRAY 2 SPRAYS INTO EACH NOSTRIL EVERY DAY   levocetirizine (XYZAL ALLERGY  24HR) 5 MG tablet, Take 5 mg by mouth every evening.   SYMBICORT  80-4.5 MCG/ACT inhaler, Inhale 2 puffs into the lungs 2 (two) times daily as  needed.  Current Outpatient Medications (Analgesics):    aspirin  EC 81 MG tablet, Take 1 tablet (81 mg total) by mouth daily. Swallow whole.   Current Outpatient Medications (Other):    DIGESTIVE ENZYMES PO, Take  1 tablet by mouth daily as needed.   famotidine  (PEPCID ) 40 MG tablet, Take 1 tablet (40 mg total) by mouth daily.   magnesium oxide (MAG-OX) 400 (240 Mg) MG tablet, Take 400 mg by mouth daily.   metroNIDAZOLE (METROCREAM) 0.75 % cream, Apply 1 application topically 2 (two) times daily.   Omega-3 Fatty Acids (FISH OIL TRIPLE STRENGTH) 1400 MG CAPS, Take by mouth daily as needed.   omeprazole  (PRILOSEC) 20 MG capsule, Take Omeprazole  20 mg by mouth daily for 4-6 weeks, and then as needed thereafter.   Plant Sterols and Stanols (CHOLESTOFF PLUS PO), Take by mouth.   Probiotic Product (PROBIOTIC DAILY PO), Take 1 tablet by mouth daily.   vitamin E 180 MG (400 UNITS) capsule, Take 400 Units by mouth daily.   Cholecalciferol (VITAMIN D3) 50 MCG (2000 UT) capsule, Take 2,000 Units by mouth daily.   sertraline  (ZOLOFT ) 25 MG tablet, Take 1 tablet (25 mg total) by mouth daily.   Reviewed prior external information including notes and imaging from  primary care provider As well as notes that were available from care everywhere and other healthcare systems.  Past medical history, social, surgical and family history all reviewed in electronic medical record.  No pertanent information unless stated regarding to the chief complaint.   Review of Systems:  No headache, visual changes, nausea, vomiting, diarrhea, constipation, dizziness, abdominal pain, skin rash, fevers, chills, night sweats, weight loss, swollen lymph nodes, body aches, joint swelling, chest pain, shortness of breath, mood changes. POSITIVE muscle aches  Objective  Blood pressure 112/78, pulse 60, height 5' 7.5 (1.715 m), weight 143 lb (64.9 kg), SpO2 98%.   General: No apparent distress alert and oriented x3 mood and  affect normal, dressed appropriately.  HEENT: Pupils equal, extraocular movements intact  Respiratory: Patient's speak in full sentences and does not appear short of breath  Cardiovascular: No lower extremity edema, non tender, no erythema  Right shoulder exam shows the patient does have weakness.  Of 5 strength of the rotator cuff.  Patient does have a positive crossover noted.  Patient is seen to be extremely uncomfortable with all testing and different motions.  Limited muscular skeletal ultrasound was performed and interpreted by [name] HUSSAR, M  Limited ultrasound unfortunately shows the patient's rotator cuff still has what appears to be a tear of the supraspinatus noted.  Difficult to assess how large it is with the patient.  Some acromioclavicular arthritis with potential spurring noted in the area that could be contributing to impingement as well.    Impression and Recommendations:    The above documentation has been reviewed and is accurate and complete Julya Alioto M Jamieon Lannen, DO

## 2024-03-19 ENCOUNTER — Ambulatory Visit: Admitting: Family Medicine

## 2024-03-19 ENCOUNTER — Other Ambulatory Visit: Payer: Self-pay

## 2024-03-19 ENCOUNTER — Encounter: Payer: Self-pay | Admitting: Family Medicine

## 2024-03-19 VITALS — BP 112/78 | HR 60 | Ht 67.5 in | Wt 143.0 lb

## 2024-03-19 DIAGNOSIS — M75111 Incomplete rotator cuff tear or rupture of right shoulder, not specified as traumatic: Secondary | ICD-10-CM

## 2024-03-19 DIAGNOSIS — M25511 Pain in right shoulder: Secondary | ICD-10-CM | POA: Diagnosis not present

## 2024-03-19 NOTE — Patient Instructions (Addendum)
 MRI at [facility]  When we receive your results we will contact you.

## 2024-03-19 NOTE — Assessment & Plan Note (Signed)
 Unfortunately concerned that patient is having worsening rotator cuff tear.  I do feel an MRI is necessary at this time.  Patient is going to have the MRI ordered and has failed all other conservative therapy including the injections, icing regimen, oral anti-inflammatories, PRP, formal physical therapy.  Depending on imaging we will see if patient needs surgical intervention.

## 2024-03-28 ENCOUNTER — Ambulatory Visit (INDEPENDENT_AMBULATORY_CARE_PROVIDER_SITE_OTHER)

## 2024-03-28 DIAGNOSIS — M19011 Primary osteoarthritis, right shoulder: Secondary | ICD-10-CM | POA: Diagnosis not present

## 2024-03-28 DIAGNOSIS — M25511 Pain in right shoulder: Secondary | ICD-10-CM

## 2024-03-28 DIAGNOSIS — M75121 Complete rotator cuff tear or rupture of right shoulder, not specified as traumatic: Secondary | ICD-10-CM | POA: Diagnosis not present

## 2024-03-29 ENCOUNTER — Ambulatory Visit: Payer: Self-pay | Admitting: Family Medicine

## 2024-03-29 DIAGNOSIS — Z1231 Encounter for screening mammogram for malignant neoplasm of breast: Secondary | ICD-10-CM | POA: Diagnosis not present

## 2024-03-29 DIAGNOSIS — Z124 Encounter for screening for malignant neoplasm of cervix: Secondary | ICD-10-CM | POA: Diagnosis not present

## 2024-03-29 DIAGNOSIS — Z6822 Body mass index (BMI) 22.0-22.9, adult: Secondary | ICD-10-CM | POA: Diagnosis not present

## 2024-03-29 DIAGNOSIS — Z01419 Encounter for gynecological examination (general) (routine) without abnormal findings: Secondary | ICD-10-CM | POA: Diagnosis not present

## 2024-03-30 ENCOUNTER — Other Ambulatory Visit: Payer: Self-pay

## 2024-03-30 DIAGNOSIS — M75111 Incomplete rotator cuff tear or rupture of right shoulder, not specified as traumatic: Secondary | ICD-10-CM

## 2024-04-02 DIAGNOSIS — M75121 Complete rotator cuff tear or rupture of right shoulder, not specified as traumatic: Secondary | ICD-10-CM | POA: Diagnosis not present

## 2024-04-12 ENCOUNTER — Telehealth (HOSPITAL_BASED_OUTPATIENT_CLINIC_OR_DEPARTMENT_OTHER): Payer: Self-pay | Admitting: *Deleted

## 2024-04-12 NOTE — Telephone Encounter (Signed)
   Pre-operative Risk Assessment    Patient Name: Renee [name]  DOB: 06/10/1961 MRN: [id number]   Date of last office visit: 03/12/24 DR. CRENSHAW Date of next office visit: NONE   Request for Surgical Clearance    Procedure:  RIGHT SHOULDER ARTHROSCOPIC, RCR, SAD, OPEN BICEPS TENODESIS vs INCORPORATION OF BICEPS TENDON INTO DUFF REPAIR  Date of Surgery:  Clearance TBD                                Surgeon:  DR. JUSTIN CHANDLER Surgeon's Group or Practice Name:  [facility] Phone number:  [phone]  Fax number:  [phone] ATTN: VINA PURCHASE   Type of Clearance Requested:   - Medical  - Pharmacy:  Hold Aspirin      Type of Anesthesia:  PER FORM: GENERAL or SPINAL    Additional requests/questions:    [name]   04/12/2024, 3:56 PM

## 2024-04-13 NOTE — Telephone Encounter (Signed)
   Patient Name: Renee [name]  DOB: Aug 10, 1960 MRN: [id number]  Primary Cardiologist: Redell Shallow, MD  Chart reviewed as part of pre-operative protocol coverage. Pre-op clearance already addressed by colleagues in earlier phone notes. To summarize recommendations:  -Ok for surgery Redell Shallow  Should be okay to hold ASA for 5-7 days prior to surgery and resume when medically safe to do so.  Will route this bundled recommendation to requesting provider via Epic fax function and remove from pre-op pool. Please call with questions.  Orren LOISE Fabry, PA-C 04/13/2024, 7:32 AM

## 2024-04-20 DIAGNOSIS — M7521 Bicipital tendinitis, right shoulder: Secondary | ICD-10-CM | POA: Diagnosis not present

## 2024-04-20 DIAGNOSIS — M7541 Impingement syndrome of right shoulder: Secondary | ICD-10-CM | POA: Diagnosis not present

## 2024-04-20 DIAGNOSIS — Y999 Unspecified external cause status: Secondary | ICD-10-CM | POA: Diagnosis not present

## 2024-04-20 DIAGNOSIS — M24111 Other articular cartilage disorders, right shoulder: Secondary | ICD-10-CM | POA: Diagnosis not present

## 2024-04-20 DIAGNOSIS — G8918 Other acute postprocedural pain: Secondary | ICD-10-CM | POA: Diagnosis not present

## 2024-04-20 DIAGNOSIS — X58XXXA Exposure to other specified factors, initial encounter: Secondary | ICD-10-CM | POA: Diagnosis not present

## 2024-04-20 DIAGNOSIS — S43431A Superior glenoid labrum lesion of right shoulder, initial encounter: Secondary | ICD-10-CM | POA: Diagnosis not present

## 2024-04-20 DIAGNOSIS — M7581 Other shoulder lesions, right shoulder: Secondary | ICD-10-CM | POA: Diagnosis not present

## 2024-04-20 DIAGNOSIS — M75111 Incomplete rotator cuff tear or rupture of right shoulder, not specified as traumatic: Secondary | ICD-10-CM | POA: Diagnosis not present

## 2024-05-10 ENCOUNTER — Other Ambulatory Visit: Payer: Self-pay | Admitting: Internal Medicine

## 2024-05-11 ENCOUNTER — Ambulatory Visit: Admitting: Family Medicine

## 2024-05-11 ENCOUNTER — Encounter: Payer: Self-pay | Admitting: Family Medicine

## 2024-05-11 ENCOUNTER — Encounter: Payer: Self-pay | Admitting: Cardiology

## 2024-05-11 ENCOUNTER — Ambulatory Visit: Payer: Self-pay

## 2024-05-11 VITALS — BP 130/78 | HR 94 | Temp 98.6°F | Ht 67.5 in | Wt 138.6 lb

## 2024-05-11 DIAGNOSIS — K582 Mixed irritable bowel syndrome: Secondary | ICD-10-CM | POA: Diagnosis not present

## 2024-05-11 DIAGNOSIS — F331 Major depressive disorder, recurrent, moderate: Secondary | ICD-10-CM

## 2024-05-11 DIAGNOSIS — M25511 Pain in right shoulder: Secondary | ICD-10-CM

## 2024-05-11 DIAGNOSIS — F411 Generalized anxiety disorder: Secondary | ICD-10-CM

## 2024-05-11 DIAGNOSIS — M545 Low back pain, unspecified: Secondary | ICD-10-CM

## 2024-05-11 DIAGNOSIS — F4320 Adjustment disorder, unspecified: Secondary | ICD-10-CM | POA: Diagnosis not present

## 2024-05-11 DIAGNOSIS — R197 Diarrhea, unspecified: Secondary | ICD-10-CM

## 2024-05-11 DIAGNOSIS — G9339 Other post infection and related fatigue syndromes: Secondary | ICD-10-CM

## 2024-05-11 DIAGNOSIS — G8929 Other chronic pain: Secondary | ICD-10-CM

## 2024-05-11 MED ORDER — ALPRAZOLAM 0.5 MG PO TABS: 0.5000 mg | ORAL_TABLET | Freq: Two times a day (BID) | ORAL | 0 refills | Status: AC | PRN

## 2024-05-11 NOTE — Patient Instructions (Addendum)
 We have completed GeneSight testing.  This should be back by next week.  Will be in contact with results as soon as I get them, I am hoping for Monday.  I am going to have you stop by the lab to make sure that your diarrhea is not infectious or contagious.  They will give you a stool sample kit with instructions on how to collect and when to return.  Continue the Zoloft  at 25 mg once daily for right now.  I have sent in alprazolam  0.5 mg for you to take half tablet as needed for increased anxiety.  This can also act as a smooth muscle relaxant to help with your stomach as well.  Follow-up with me for new or worsening symptoms.

## 2024-05-11 NOTE — Progress Notes (Signed)
 Acute Office Visit  Subjective:     Patient ID: Renee [name], female    DOB: 10/16/1960, 63 y.o.   MRN: [id number]  Chief Complaint  Patient presents with   Acute Visit    Abdominal pain and diarrhea for about 2 days. Denies N/V, lower abdominal pain not really localized to one side   Anxiety    HPI  Discussed the use of AI scribe software for clinical note transcription with the patient, who gave verbal consent to proceed.  History of Present Illness Renee [name] is a 63 year old female who presents with anxiety, sleep disturbances, and gastrointestinal symptoms following recent stressors.  Anxiety and psychological stress - Significant anxiety following her father's death and ongoing stress related to her mother's dementia and care needs - Constant anxiety with perception of an audible heart rate in her head - Stress exacerbated by arranging round-the-clock care for her mother at [place], where her father was previously the primary caregiver - Back pain attributed to stress, managed with Tylenol  Sleep disturbance - Sleep limited to two to three hours per night - Melatonin provides three to four hours of sleep before awakening; requires a second dose to return to sleep - Sleep further disrupted by the need to sleep on her back due to recent shoulder surgery  Pharmacologic management of anxiety and pain - Resumed sertraline  (Zoloft ) 25 mg daily after a six-month hiatus; previously used for 17 years - Uses alprazolam  sparingly due to limited supply - Discontinued oxycodone for shoulder pain due to nausea; currently uses Tylenol as needed  Gastrointestinal symptoms - Onset of deep abdominal cramps and diarrhea after visiting her mother at a healthcare facility, beginning last night - Minimal oral intake, limited to lemon and ginger tea and toast in the morning, and some food in the evening - No fever or blood in stool - Chills present today     ROS Per HPI       Objective:    BP 130/78 (BP Location: Left Arm, Patient Position: Sitting)   Pulse 94   Temp 98.6 F (37 C) (Temporal)   Ht 5' 7.5 (1.715 m)   Wt 138 lb 9.6 oz (62.9 kg)   LMP  (LMP Unknown)   SpO2 97%   BMI 21.39 kg/m    Physical Exam Vitals and nursing note reviewed.  Constitutional:      General: She is not in acute distress.    Appearance: She is normal weight.     Comments: Appears fatigued  HENT:     Head: Normocephalic and atraumatic.     Right Ear: External ear normal.     Left Ear: External ear normal.     Nose: Nose normal.     Mouth/Throat:     Mouth: Mucous membranes are moist.     Pharynx: Oropharynx is clear.  Eyes:     Extraocular Movements: Extraocular movements intact.     Pupils: Pupils are equal, round, and reactive to light.  Cardiovascular:     Rate and Rhythm: Normal rate and regular rhythm.     Pulses: Normal pulses.     Heart sounds: Normal heart sounds.  Pulmonary:     Effort: Pulmonary effort is normal. No respiratory distress.     Breath sounds: Normal breath sounds. No wheezing, rhonchi or rales.  Abdominal:     General: There is no distension.     Palpations: There is no mass.     Tenderness: There is no  abdominal tenderness. There is no guarding or rebound.     Hernia: No hernia is present.     Comments: Hyperactive bowel sounds  Musculoskeletal:        General: Normal range of motion.     Cervical back: Normal range of motion.     Right lower leg: No edema.     Left lower leg: No edema.  Lymphadenopathy:     Cervical: No cervical adenopathy.  Neurological:     General: No focal deficit present.     Mental Status: She is alert and oriented to person, place, and time.  Psychiatric:        Mood and Affect: Mood normal.        Thought Content: Thought content normal.     Results for orders placed or performed in visit on 05/11/24  GI Profile, Stool, PCR  Result Value Ref Range   Campylobacter Detected (A) Not Detected   C  difficile toxin A/B Not Detected Not Detected   Plesiomonas shigelloides Not Detected Not Detected   Salmonella Not Detected Not Detected   Vibrio Not Detected Not Detected   Vibrio cholerae Not Detected Not Detected   Yersinia enterocolitica Not Detected Not Detected   Enteroaggregative E coli Not Detected Not Detected   Enteropathogenic E coli Not Detected Not Detected   Enterotoxigenic E coli Not Detected Not Detected   Shiga-toxin-producing E coli Not Detected Not Detected   E coli O157 Not applicable Not Detected   Shigella/Enteroinvasive E coli Not Detected Not Detected   Cryptosporidium Not Detected Not Detected   Cyclospora cayetanensis Not Detected Not Detected   Entamoeba histolytica Not Detected Not Detected   Giardia lamblia Not Detected Not Detected   Adenovirus F 40/41 Not Detected Not Detected   Astrovirus Not Detected Not Detected   Norovirus GI/GII Not Detected Not Detected   Rotavirus A Not Detected Not Detected   Sapovirus Not Detected Not Detected        Assessment & Plan:   Assessment and Plan Assessment & Plan Grief reaction, mdd, rec, mod, GAD exacerbated by bereavement and caregiving stress. Restarted Zoloft  due to increased anxiety and depressive symptoms. Discussed trazodone for sleep and anxiety. Genetic testing for medication response discussed. - Continue Zoloft  25 mg daily for 4 weeks before increasing dose. - Increased alprazolam  prescription for flexible dosing. - Consider trazodone for sleep and anxiety management. - Ordered genetic testing for medication response.  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 No fever or blood in stool. - Ordered stool culture to rule out infectious causes. - Continue managing fluids with water and lemon ginger tea.  Fatigue related to stress and recent illness Fatigue likely due to stress from bereavement,  caregiving, and recent illness. Difficulty sleeping due to anxiety and stress. - Address anxiety and stress management to improve sleep and reduce fatigue.  Chronic right shoulder pain Chronic pain status post rotator cuff and biceps surgery Chronic pain managed with Tylenol. Previous oxycodone use caused nausea. Pain exacerbated by stress and bereavement. - Continue Tylenol for pain management.  Chronic low back pain Pain likely related to muscle tension and nerve irritation due to stress and poor posture. - Continue Tylenol for pain management.   Discussed the option of GeneSight genetic testing.  This test is a buccal swab that will help analyze your DNA and help determine how you metabolize certain medications.  This test costs $  330 out of pocket, insurance usually covers most of it.  Patient would like to proceed. GeneSight test performed and ordered, send via FedEx today. We will be in touch with results once they are received.    Orders Placed This Encounter  Procedures   GI Profile, Stool, PCR     Meds ordered this encounter  Medications   ALPRAZolam  (XANAX ) 0.5 MG tablet    Sig: Take 1 tablet (0.5 mg total) by mouth 2 (two) times daily as needed for anxiety.    Dispense:  20 tablet    Refill:  0    Return in about 4 weeks (around 06/08/2024) for meds OV.  Corean LITTIE Ku, FNP

## 2024-05-11 NOTE — Telephone Encounter (Signed)
 FYI Only or Action Required?: FYI only for provider: appointment scheduled on 05/11/24.  Patient was last seen in primary care on 02/19/2023 by Rollene Almarie LABOR, MD.  Called Nurse Triage reporting Abdominal Cramping and Diarrhea.  Symptoms began yesterday.  Interventions attempted: Rest, hydration, or home remedies.  Symptoms are: unchanged.  Triage Disposition: See Physician Within 24 Hours  Patient/caregiver understands and will follow disposition?: Yes                            Copied from CRM [id number]. Topic: Clinical - Red Word Triage >> May 11, 2024  9:11 AM Viola FALCON wrote: Red Word that prompted transfer to Nurse Triage: Patient having severe cramps and diarrhea all night - requested appt for today or tomorrow  Reason for Disposition  Abdominal pain  (Exceptions: Pain clears with each passage of diarrhea stool, or symptoms similar to previously diagnosed irritable bowel syndrome.)  Answer Assessment - Initial Assessment Questions 1. DIARRHEA SEVERITY: How bad is the diarrhea? How many more stools have you had in the past 24 hours than normal?      At least 5 times 2. ONSET: When did the diarrhea begin?      Yesterday evening 3. STOOL DESCRIPTION:  How loose or watery is the diarrhea? What is the stool color? Is there any blood or mucous in the stool?     Watery and pale 4. VOMITING: Are you also vomiting? If Yes, ask: How many times in the past 24 hours?      Denies 5. ABDOMEN PAIN: Are you having any abdomen pain? If Yes, ask: What does it feel like? (e.g., crampy, dull, intermittent, constant)      Lower abdominal cramping 6. ABDOMEN PAIN SEVERITY: If present, ask: How bad is the pain?  (e.g., Scale 1-10; mild, moderate, or severe)     Rates pain an 8 when cramps happen 7. ORAL INTAKE: If vomiting, Have you been able to drink liquids? How much liquids have you had in the past 24 hours?     States she is trying to  maintain her fluid intake  8. HYDRATION: Any signs of dehydration? (e.g., dry mouth [not just dry lips], too weak to stand, dizziness, new weight loss) When did you last urinate?     Mild dry mouth, denies dizziness, denies weakness 9. EXPOSURE: Have you traveled to a foreign country recently? Have you been exposed to anyone with diarrhea? Could you have eaten any food that was spoiled?     States she has been visiting her mother in a facility   10. ANTIBIOTIC USE: Are you taking antibiotics now or have you taken antibiotics in the past 2 months?     Denies 11. OTHER SYMPTOMS: Do you have any other symptoms? (e.g., fever, blood in stool)     Upset stomach related to anxiety of father passing, denies fever 12. PREGNANCY: Is there any chance you are pregnant? When was your last menstrual period?     N/A  Protocols used: [facility]

## 2024-05-12 DIAGNOSIS — R197 Diarrhea, unspecified: Secondary | ICD-10-CM | POA: Diagnosis not present

## 2024-05-13 ENCOUNTER — Ambulatory Visit: Payer: Self-pay | Admitting: Family Medicine

## 2024-05-13 LAB — GI PROFILE, STOOL, PCR

## 2024-05-13 MED ORDER — AZITHROMYCIN 250 MG PO TABS: ORAL_TABLET | ORAL | 0 refills | Status: AC

## 2024-05-13 NOTE — Telephone Encounter (Signed)
Addressed in results.

## 2024-05-18 DIAGNOSIS — M75121 Complete rotator cuff tear or rupture of right shoulder, not specified as traumatic: Secondary | ICD-10-CM | POA: Diagnosis not present

## 2024-05-18 DIAGNOSIS — M25511 Pain in right shoulder: Secondary | ICD-10-CM | POA: Diagnosis not present

## 2024-05-18 DIAGNOSIS — M6281 Muscle weakness (generalized): Secondary | ICD-10-CM | POA: Diagnosis not present

## 2024-05-18 DIAGNOSIS — M25611 Stiffness of right shoulder, not elsewhere classified: Secondary | ICD-10-CM | POA: Diagnosis not present

## 2024-05-20 DIAGNOSIS — M6281 Muscle weakness (generalized): Secondary | ICD-10-CM | POA: Diagnosis not present

## 2024-05-20 DIAGNOSIS — M25611 Stiffness of right shoulder, not elsewhere classified: Secondary | ICD-10-CM | POA: Diagnosis not present

## 2024-05-20 DIAGNOSIS — M75121 Complete rotator cuff tear or rupture of right shoulder, not specified as traumatic: Secondary | ICD-10-CM | POA: Diagnosis not present

## 2024-05-20 DIAGNOSIS — M25511 Pain in right shoulder: Secondary | ICD-10-CM | POA: Diagnosis not present

## 2024-05-21 MED ORDER — METOPROLOL SUCCINATE ER 25 MG PO TB24: 25.0000 mg | ORAL_TABLET | Freq: Every day | ORAL | 1 refills | Status: AC

## 2024-05-24 ENCOUNTER — Encounter: Payer: Self-pay | Admitting: Family Medicine

## 2024-05-24 DIAGNOSIS — M6281 Muscle weakness (generalized): Secondary | ICD-10-CM | POA: Diagnosis not present

## 2024-05-24 DIAGNOSIS — M75121 Complete rotator cuff tear or rupture of right shoulder, not specified as traumatic: Secondary | ICD-10-CM | POA: Diagnosis not present

## 2024-05-24 DIAGNOSIS — M25511 Pain in right shoulder: Secondary | ICD-10-CM | POA: Diagnosis not present

## 2024-05-24 DIAGNOSIS — M25611 Stiffness of right shoulder, not elsewhere classified: Secondary | ICD-10-CM | POA: Diagnosis not present

## 2024-05-26 DIAGNOSIS — M25611 Stiffness of right shoulder, not elsewhere classified: Secondary | ICD-10-CM | POA: Diagnosis not present

## 2024-05-26 DIAGNOSIS — M75121 Complete rotator cuff tear or rupture of right shoulder, not specified as traumatic: Secondary | ICD-10-CM | POA: Diagnosis not present

## 2024-05-26 DIAGNOSIS — M25511 Pain in right shoulder: Secondary | ICD-10-CM | POA: Diagnosis not present

## 2024-05-26 DIAGNOSIS — M6281 Muscle weakness (generalized): Secondary | ICD-10-CM | POA: Diagnosis not present

## 2024-06-02 DIAGNOSIS — M25511 Pain in right shoulder: Secondary | ICD-10-CM | POA: Diagnosis not present

## 2024-06-02 DIAGNOSIS — M75121 Complete rotator cuff tear or rupture of right shoulder, not specified as traumatic: Secondary | ICD-10-CM | POA: Diagnosis not present

## 2024-06-02 DIAGNOSIS — M25611 Stiffness of right shoulder, not elsewhere classified: Secondary | ICD-10-CM | POA: Diagnosis not present

## 2024-06-02 DIAGNOSIS — M6281 Muscle weakness (generalized): Secondary | ICD-10-CM | POA: Diagnosis not present

## 2024-06-04 DIAGNOSIS — M25611 Stiffness of right shoulder, not elsewhere classified: Secondary | ICD-10-CM | POA: Diagnosis not present

## 2024-06-04 DIAGNOSIS — M25511 Pain in right shoulder: Secondary | ICD-10-CM | POA: Diagnosis not present

## 2024-06-04 DIAGNOSIS — M6281 Muscle weakness (generalized): Secondary | ICD-10-CM | POA: Diagnosis not present

## 2024-06-04 DIAGNOSIS — M75121 Complete rotator cuff tear or rupture of right shoulder, not specified as traumatic: Secondary | ICD-10-CM | POA: Diagnosis not present

## 2024-06-08 DIAGNOSIS — M25511 Pain in right shoulder: Secondary | ICD-10-CM | POA: Diagnosis not present

## 2024-06-08 DIAGNOSIS — M25611 Stiffness of right shoulder, not elsewhere classified: Secondary | ICD-10-CM | POA: Diagnosis not present

## 2024-06-08 DIAGNOSIS — M6281 Muscle weakness (generalized): Secondary | ICD-10-CM | POA: Diagnosis not present

## 2024-06-08 DIAGNOSIS — M75121 Complete rotator cuff tear or rupture of right shoulder, not specified as traumatic: Secondary | ICD-10-CM | POA: Diagnosis not present

## 2024-06-10 DIAGNOSIS — M6281 Muscle weakness (generalized): Secondary | ICD-10-CM | POA: Diagnosis not present

## 2024-06-10 DIAGNOSIS — M25611 Stiffness of right shoulder, not elsewhere classified: Secondary | ICD-10-CM | POA: Diagnosis not present

## 2024-06-10 DIAGNOSIS — M75121 Complete rotator cuff tear or rupture of right shoulder, not specified as traumatic: Secondary | ICD-10-CM | POA: Diagnosis not present

## 2024-06-10 DIAGNOSIS — M25511 Pain in right shoulder: Secondary | ICD-10-CM | POA: Diagnosis not present

## 2024-06-15 DIAGNOSIS — M75121 Complete rotator cuff tear or rupture of right shoulder, not specified as traumatic: Secondary | ICD-10-CM | POA: Diagnosis not present

## 2024-06-15 DIAGNOSIS — M25611 Stiffness of right shoulder, not elsewhere classified: Secondary | ICD-10-CM | POA: Diagnosis not present

## 2024-06-15 DIAGNOSIS — M25511 Pain in right shoulder: Secondary | ICD-10-CM | POA: Diagnosis not present

## 2024-06-15 DIAGNOSIS — M6281 Muscle weakness (generalized): Secondary | ICD-10-CM | POA: Diagnosis not present

## 2024-06-18 DIAGNOSIS — H00015 Hordeolum externum left lower eyelid: Secondary | ICD-10-CM | POA: Diagnosis not present

## 2024-06-22 DIAGNOSIS — M25511 Pain in right shoulder: Secondary | ICD-10-CM | POA: Diagnosis not present

## 2024-06-22 DIAGNOSIS — M25611 Stiffness of right shoulder, not elsewhere classified: Secondary | ICD-10-CM | POA: Diagnosis not present

## 2024-06-22 DIAGNOSIS — M75121 Complete rotator cuff tear or rupture of right shoulder, not specified as traumatic: Secondary | ICD-10-CM | POA: Diagnosis not present

## 2024-06-22 DIAGNOSIS — M6281 Muscle weakness (generalized): Secondary | ICD-10-CM | POA: Diagnosis not present

## 2024-06-29 DIAGNOSIS — M25511 Pain in right shoulder: Secondary | ICD-10-CM | POA: Diagnosis not present

## 2024-06-29 DIAGNOSIS — M75121 Complete rotator cuff tear or rupture of right shoulder, not specified as traumatic: Secondary | ICD-10-CM | POA: Diagnosis not present

## 2024-06-29 DIAGNOSIS — M25611 Stiffness of right shoulder, not elsewhere classified: Secondary | ICD-10-CM | POA: Diagnosis not present

## 2024-06-29 DIAGNOSIS — M6281 Muscle weakness (generalized): Secondary | ICD-10-CM | POA: Diagnosis not present

## 2024-06-30 DIAGNOSIS — M25611 Stiffness of right shoulder, not elsewhere classified: Secondary | ICD-10-CM | POA: Diagnosis not present

## 2024-06-30 DIAGNOSIS — M75121 Complete rotator cuff tear or rupture of right shoulder, not specified as traumatic: Secondary | ICD-10-CM | POA: Diagnosis not present

## 2024-06-30 DIAGNOSIS — M6281 Muscle weakness (generalized): Secondary | ICD-10-CM | POA: Diagnosis not present

## 2024-06-30 DIAGNOSIS — M25511 Pain in right shoulder: Secondary | ICD-10-CM | POA: Diagnosis not present

## 2024-07-12 ENCOUNTER — Encounter: Payer: Self-pay | Admitting: *Deleted

## 2024-07-12 DIAGNOSIS — I7121 Aneurysm of the ascending aorta, without rupture: Secondary | ICD-10-CM

## 2024-07-12 DIAGNOSIS — I77819 Aortic ectasia, unspecified site: Secondary | ICD-10-CM

## 2024-08-04 ENCOUNTER — Other Ambulatory Visit: Payer: Self-pay | Admitting: Cardiology

## 2024-08-06 MED ORDER — ROSUVASTATIN CALCIUM 40 MG PO TABS: 40.0000 mg | ORAL_TABLET | Freq: Every day | ORAL | 1 refills | Status: AC

## 2024-08-16 ENCOUNTER — Ambulatory Visit (HOSPITAL_COMMUNITY)
